# Patient Record
Sex: Female | Born: 1955 | Race: White | Hispanic: No | Marital: Married | Smoking: Never smoker
Health system: Southern US, Community
[De-identification: ages and names within clinical notes are randomized; demographics above are authoritative.]

## PROBLEM LIST (undated history)

## (undated) DIAGNOSIS — I4891 Unspecified atrial fibrillation: Secondary | ICD-10-CM

## (undated) DIAGNOSIS — F32A Depression, unspecified: Secondary | ICD-10-CM

## (undated) DIAGNOSIS — M1990 Unspecified osteoarthritis, unspecified site: Secondary | ICD-10-CM

## (undated) DIAGNOSIS — R002 Palpitations: Secondary | ICD-10-CM

## (undated) DIAGNOSIS — G4730 Sleep apnea, unspecified: Secondary | ICD-10-CM

## (undated) DIAGNOSIS — K219 Gastro-esophageal reflux disease without esophagitis: Secondary | ICD-10-CM

## (undated) HISTORY — PX: ENDOMETRIAL ABLATION: SHX621

## (undated) HISTORY — DX: Unspecified atrial fibrillation: I48.91

## (undated) HISTORY — PX: TUBAL LIGATION: SHX77

## (undated) HISTORY — PX: KNEE ARTHROSCOPY: SUR90

---

## 1997-08-05 ENCOUNTER — Other Ambulatory Visit: Admission: RE | Admit: 1997-08-05 | Discharge: 1997-08-05 | Payer: Self-pay | Admitting: Obstetrics and Gynecology

## 1998-07-17 ENCOUNTER — Ambulatory Visit (HOSPITAL_COMMUNITY): Admission: RE | Admit: 1998-07-17 | Discharge: 1998-07-17 | Payer: Self-pay | Admitting: Obstetrics and Gynecology

## 1998-08-13 ENCOUNTER — Other Ambulatory Visit: Admission: RE | Admit: 1998-08-13 | Discharge: 1998-08-13 | Payer: Self-pay | Admitting: Obstetrics and Gynecology

## 1998-12-02 ENCOUNTER — Encounter: Payer: Self-pay | Admitting: Family Medicine

## 1998-12-02 ENCOUNTER — Encounter: Admission: RE | Admit: 1998-12-02 | Discharge: 1998-12-02 | Payer: Self-pay | Admitting: Family Medicine

## 2000-08-30 ENCOUNTER — Encounter: Payer: Self-pay | Admitting: Obstetrics and Gynecology

## 2000-08-30 ENCOUNTER — Encounter: Admission: RE | Admit: 2000-08-30 | Discharge: 2000-08-30 | Payer: Self-pay | Admitting: Obstetrics and Gynecology

## 2000-11-01 ENCOUNTER — Other Ambulatory Visit: Admission: RE | Admit: 2000-11-01 | Discharge: 2000-11-01 | Payer: Self-pay | Admitting: Obstetrics and Gynecology

## 2002-08-22 ENCOUNTER — Encounter: Payer: Self-pay | Admitting: Obstetrics and Gynecology

## 2002-08-22 ENCOUNTER — Encounter: Admission: RE | Admit: 2002-08-22 | Discharge: 2002-08-22 | Payer: Self-pay | Admitting: Obstetrics and Gynecology

## 2003-09-11 ENCOUNTER — Other Ambulatory Visit: Admission: RE | Admit: 2003-09-11 | Discharge: 2003-09-11 | Payer: Self-pay | Admitting: Obstetrics and Gynecology

## 2003-09-24 ENCOUNTER — Encounter: Admission: RE | Admit: 2003-09-24 | Discharge: 2003-09-24 | Payer: Self-pay | Admitting: Obstetrics and Gynecology

## 2005-09-29 ENCOUNTER — Encounter: Admission: RE | Admit: 2005-09-29 | Discharge: 2005-09-29 | Payer: Self-pay | Admitting: Obstetrics and Gynecology

## 2005-09-29 IMAGING — MG MM SCREEN MAMMOGRAM BILATERAL
5 series · 5 of 5 positions shown · non-contrast
Comparison: none

DG SCREEN MAMMOGRAM BILATERAL
Bilateral CC and MLO view(s) were taken.
Prior study comparison: [DATE], bilateral screening mammogram.

SCREENING MAMMOGRAM:
The breast tissue is heterogeneously dense.   A possible mass is noted in the left breast.  Spot 
compression views and possibly sonography are recommended for further evaluation.  There are 
calcifications in the left breast.  Characterization with magnification views is recommended.  The 
right breast is unremarkable.

[R CC]
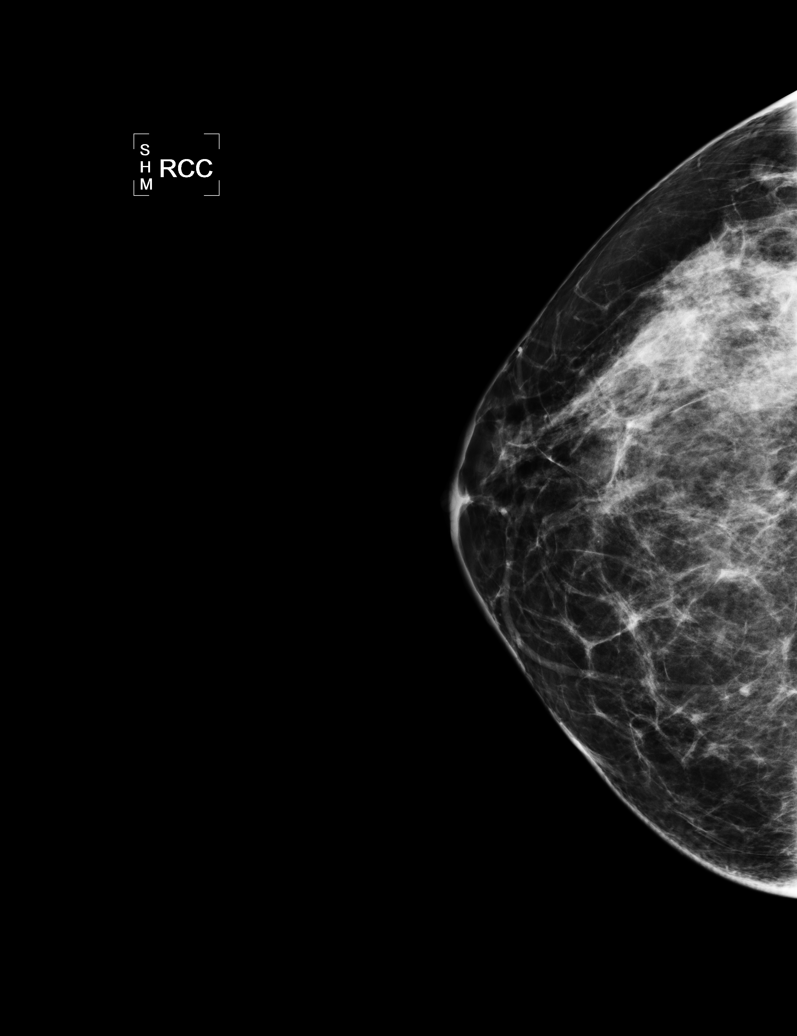

[L CC]
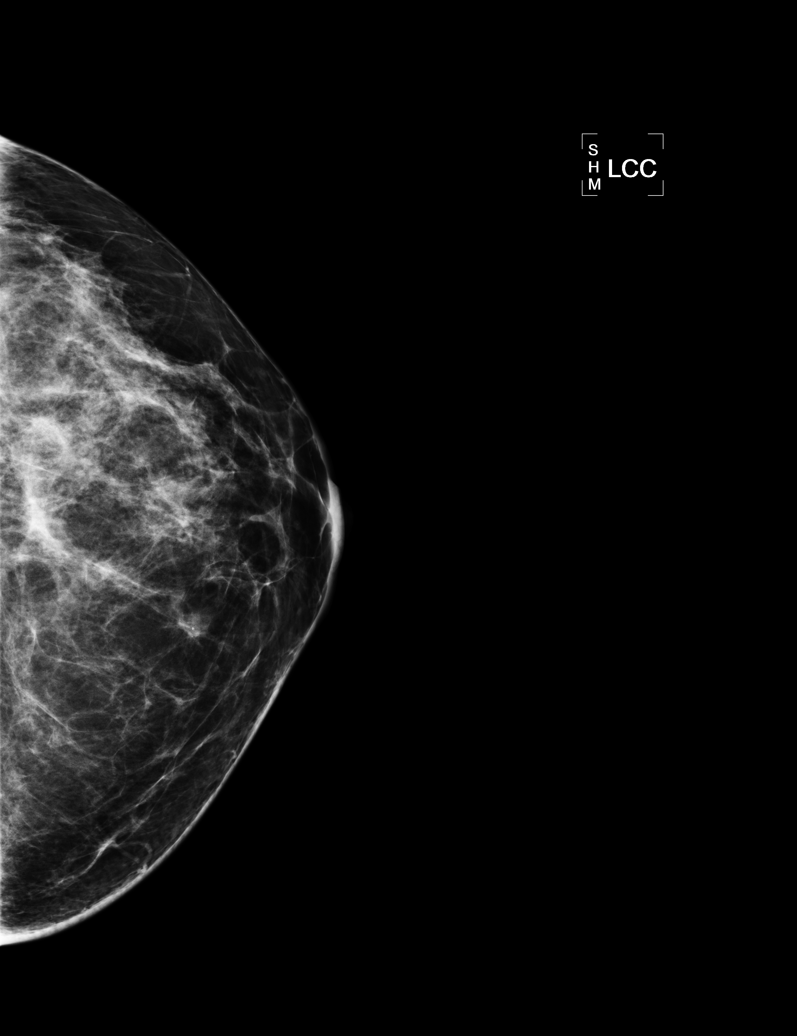

[L MLO]
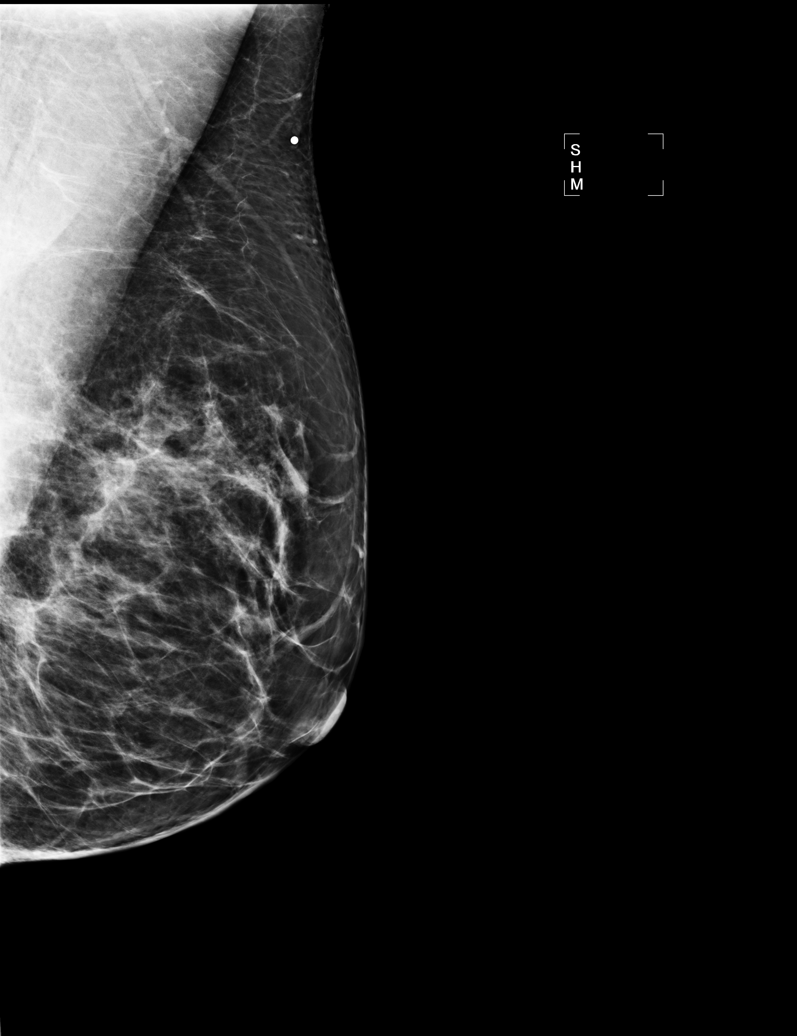

[R MLO]
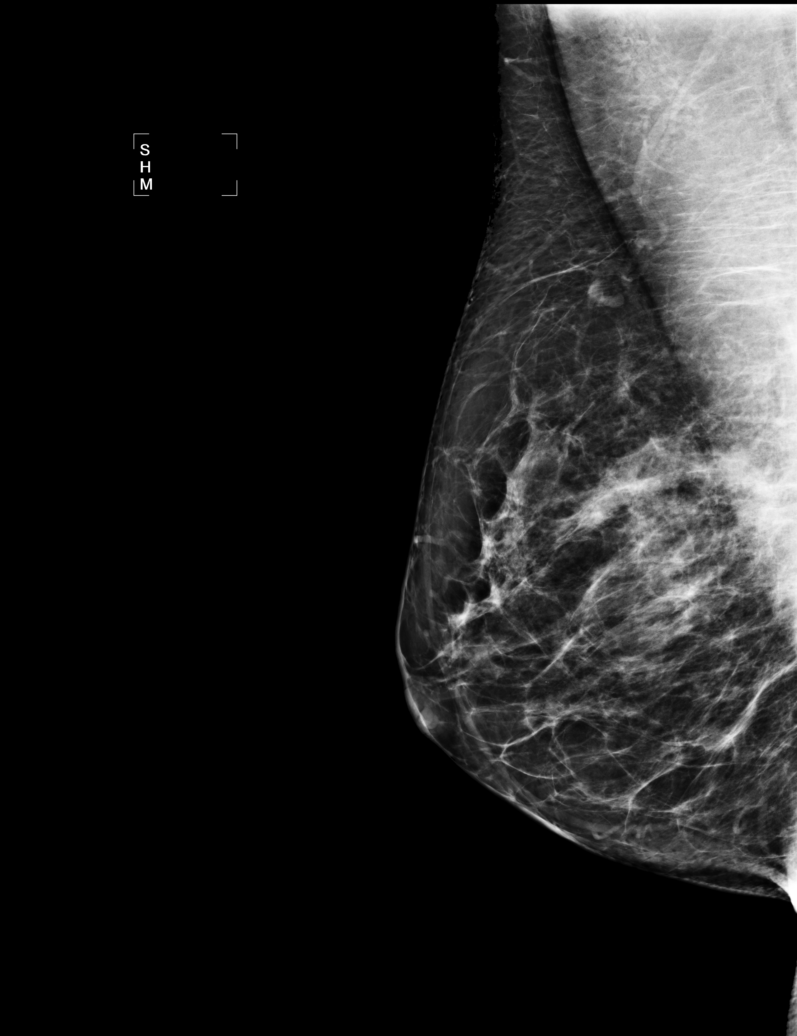

[R XCCL]
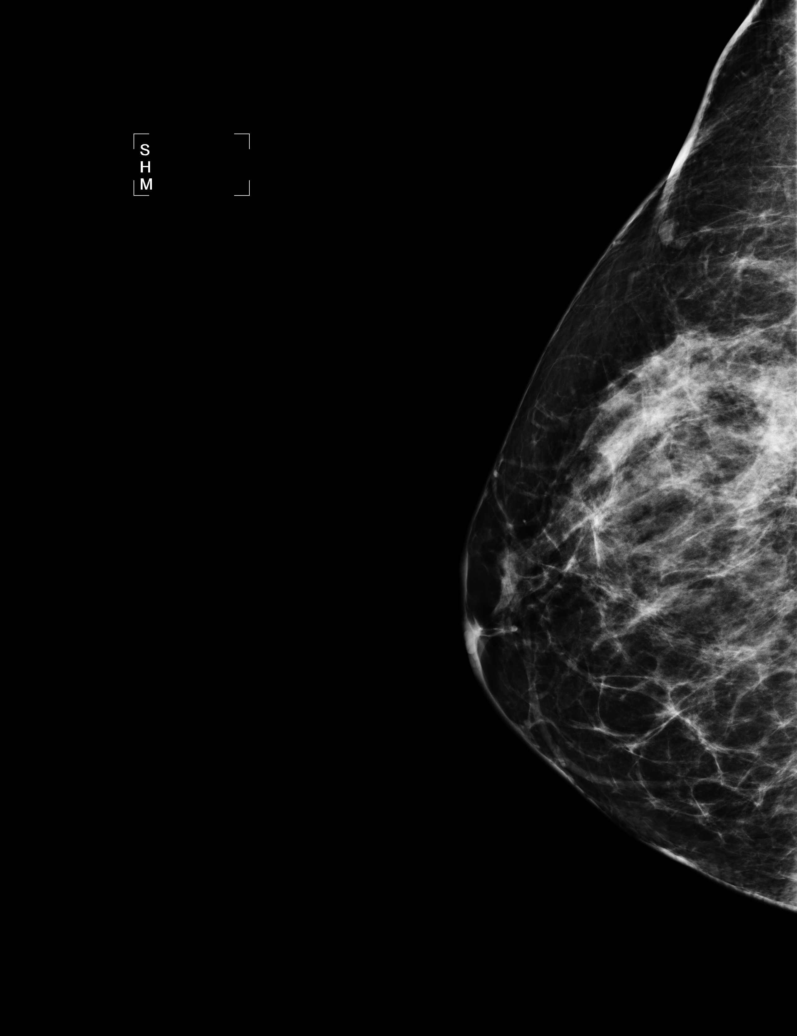

[5 of 5 positions shown; findings below may reference images not displayed]

IMPRESSION: Possible mass/calcifications, left breast.  Additional evaluation is indicated.  The patient will 
be contacted for additional studies and a supplemental report will follow.

ASSESSMENT: Need additional imaging evaluation and/or prior mammograms for comparison - BI-RADS 0 -
Left

Further imaging of the left breast.
ANALYZED BY COMPUTER AIDED DETECTION. , THIS PROCEDURE WAS A DIGITAL MAMMOGRAM.

## 2005-10-13 ENCOUNTER — Encounter: Admission: RE | Admit: 2005-10-13 | Discharge: 2005-10-13 | Payer: Self-pay | Admitting: Obstetrics and Gynecology

## 2005-10-13 IMAGING — MG MM DIAGNOSTIC LTD LEFT
2 series · 2 of 2 positions shown · non-contrast
Comparison: none

[REDACTED] LEFT
CC and MLO view(s) were taken of the left breast.

DIGITAL LIMITED LEFT DIAGNOSTIC MAMMOGRAM:
CLINICAL DATA: The patient returns for evaluation of calcifications in the left breast noted on 
recent screening study of [DATE].

[L CC]
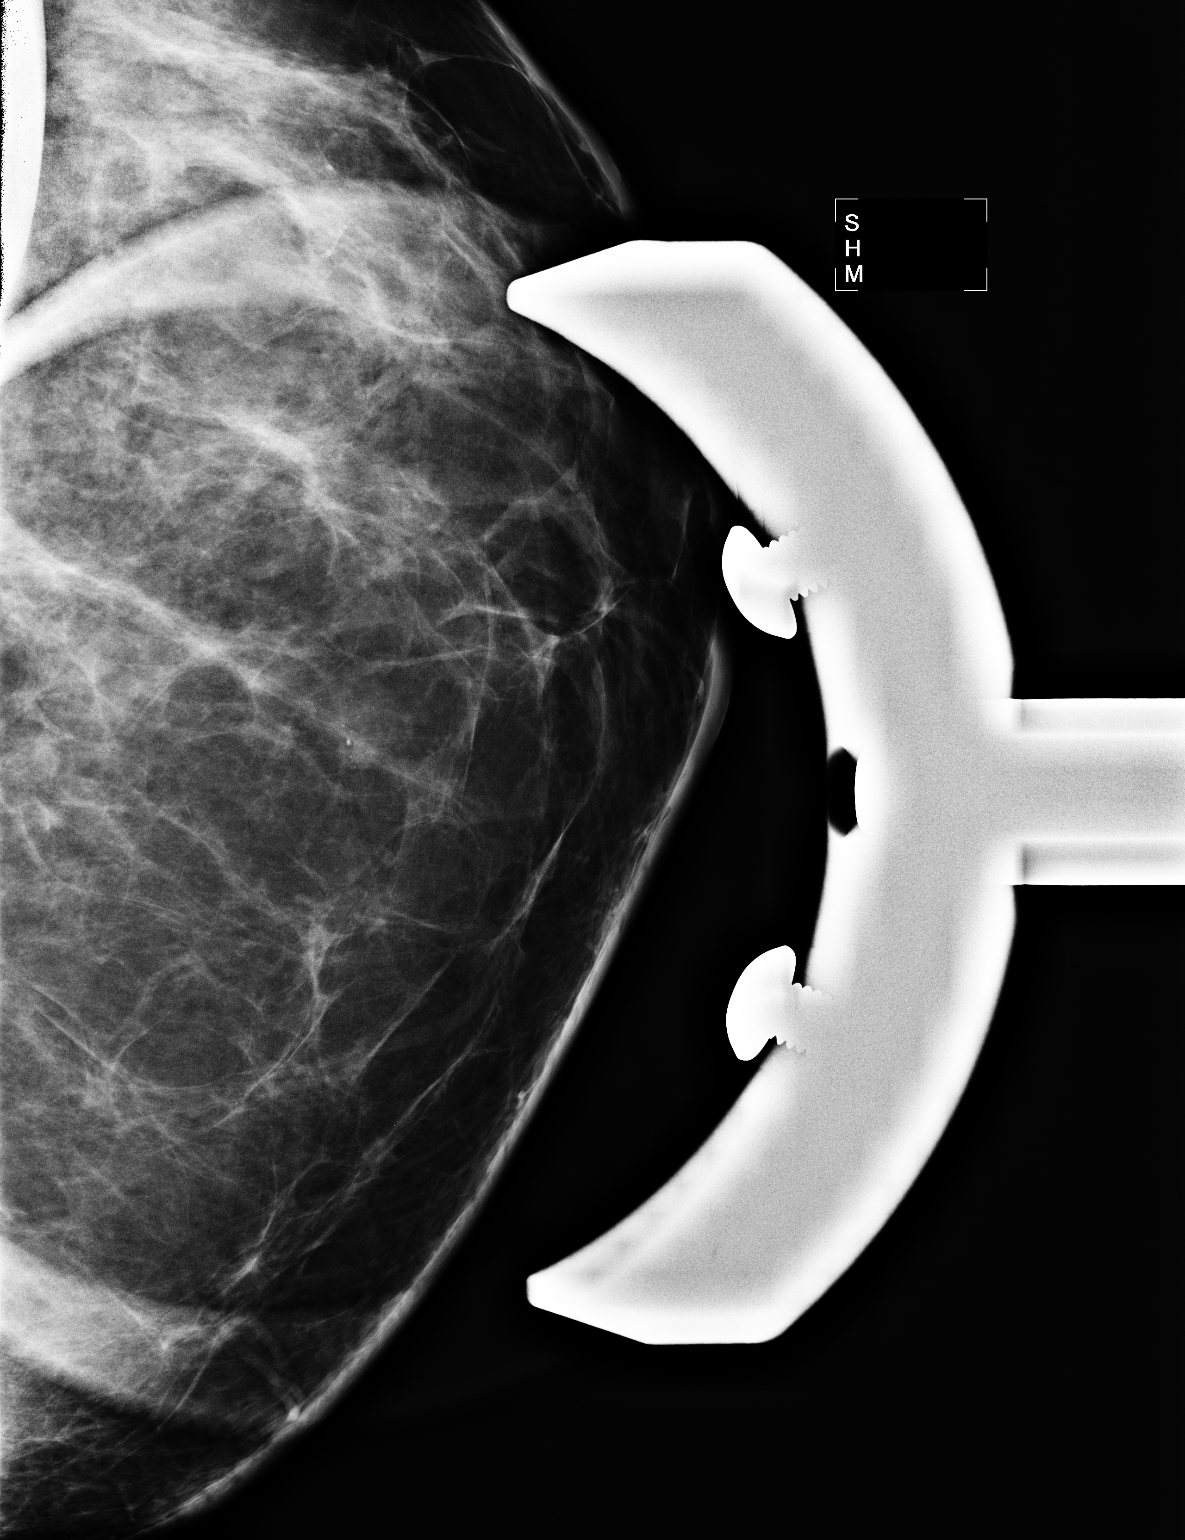

[L ML]
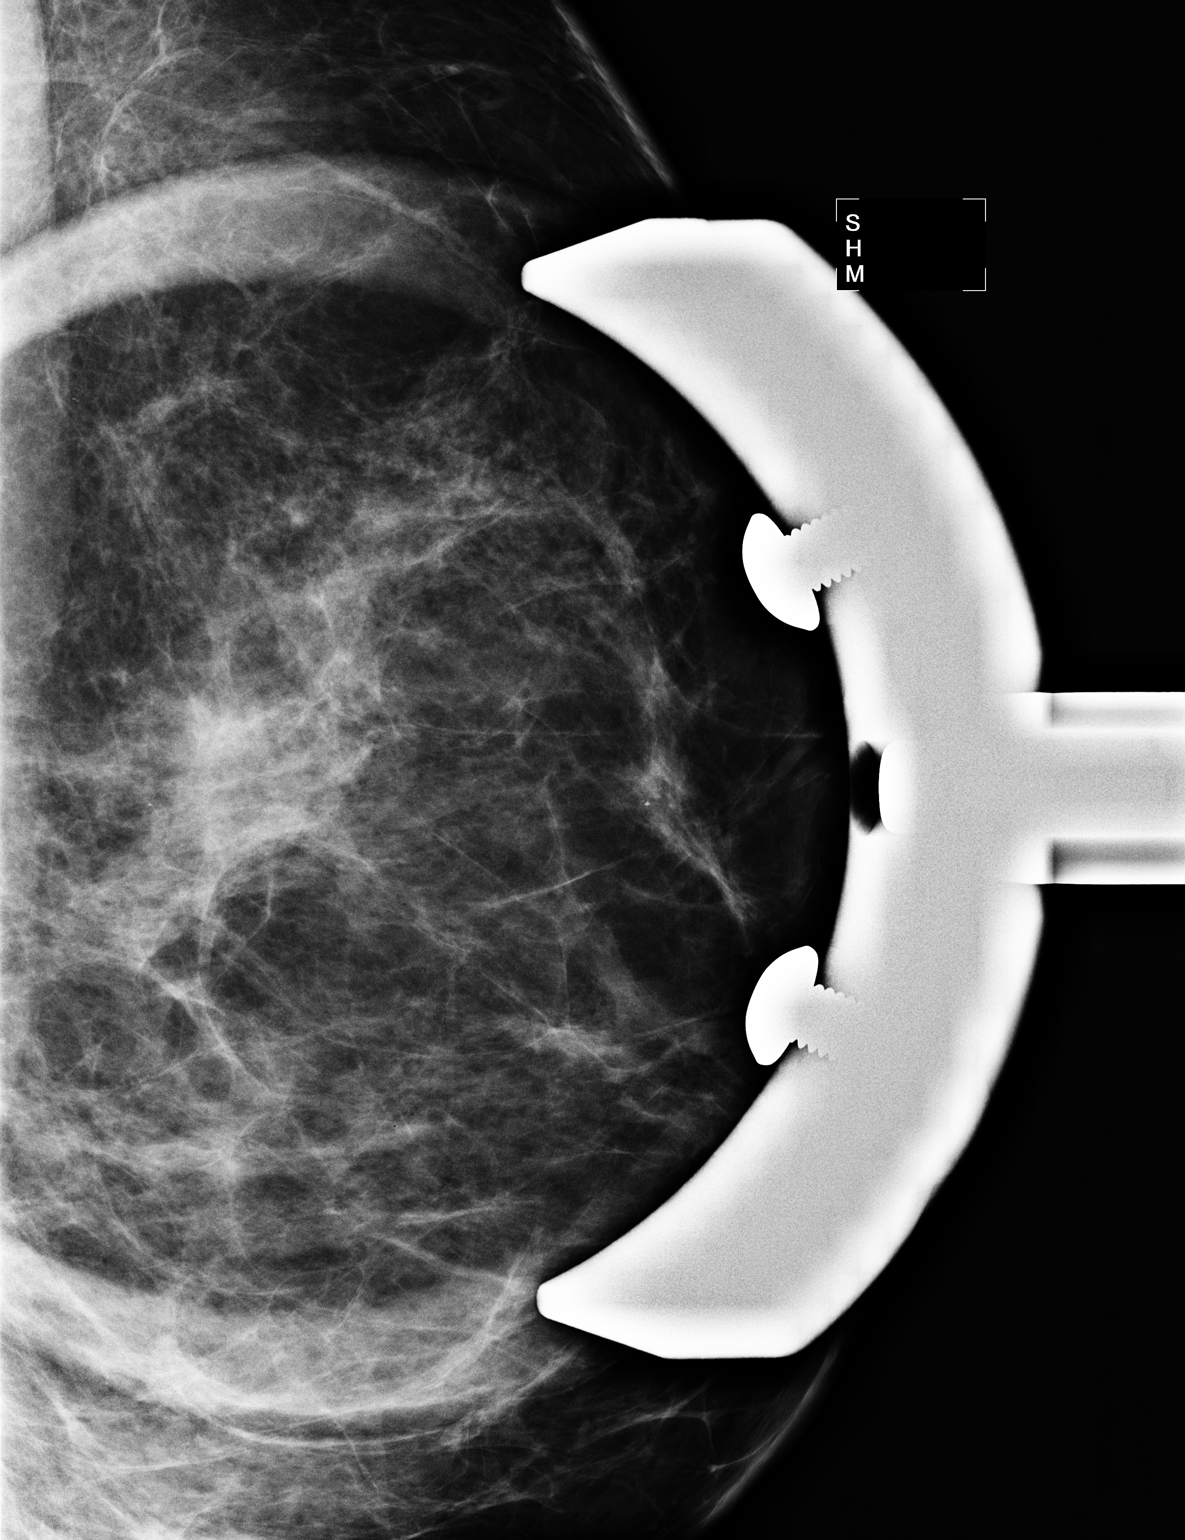

[2 of 2 positions shown; findings below may reference images not displayed]

Magnification views in the CC and ML projection demonstrate smudgy calcifications in the CC 
projection that appear to form QUIRIJN shapes on the ML projection.  This is likely benign milk of 
calcium.  In reviewing prior studies of [DATE] and [DATE], there is some motion on the left images 
but it is likely that these calcifications were present in [KP] but better seen currently due to 
the digital technique.  Six-month follow-up mammography would be suggested.
IMPRESSION: Probably benign calcification in the left breast likely better seen currently due to the digital 
technique.  Suggest six-month follow-up mammography.

ASSESSMENT: Probably benign - BI-RADS 3

Diagnostic mammogram of the left breast in 6 months.
, THIS PROCEDURE WAS A DIGITAL MAMMOGRAM.

## 2006-03-04 ENCOUNTER — Ambulatory Visit (HOSPITAL_COMMUNITY): Admission: RE | Admit: 2006-03-04 | Discharge: 2006-03-04 | Payer: Self-pay | Admitting: Obstetrics and Gynecology

## 2006-03-04 ENCOUNTER — Encounter (INDEPENDENT_AMBULATORY_CARE_PROVIDER_SITE_OTHER): Payer: Self-pay | Admitting: Specialist

## 2006-03-18 ENCOUNTER — Encounter: Admission: RE | Admit: 2006-03-18 | Discharge: 2006-03-18 | Payer: Self-pay | Admitting: Obstetrics and Gynecology

## 2006-03-18 IMAGING — MG MM DIAGNOSTIC UNILATERAL L
4 series · 4 of 4 positions shown · non-contrast
Comparison: none

DG DIAGNOSTIC UNILATERAL L
CC and MLO view(s) were taken of the left breast.

DIGITAL UNILATERAL LEFT DIAGNOSTIC MAMMOGRAM:
CLINICAL DATA: The fibroglandular parenchymal pattern throughout the left breast is stable.  Benign-appearing 
calcifications in the inner left breast are stable, as well. No dominant masses, suspicious 
calcifications, or areas of architectural distortion are seen on the left.

[L CC (1 of 2)]
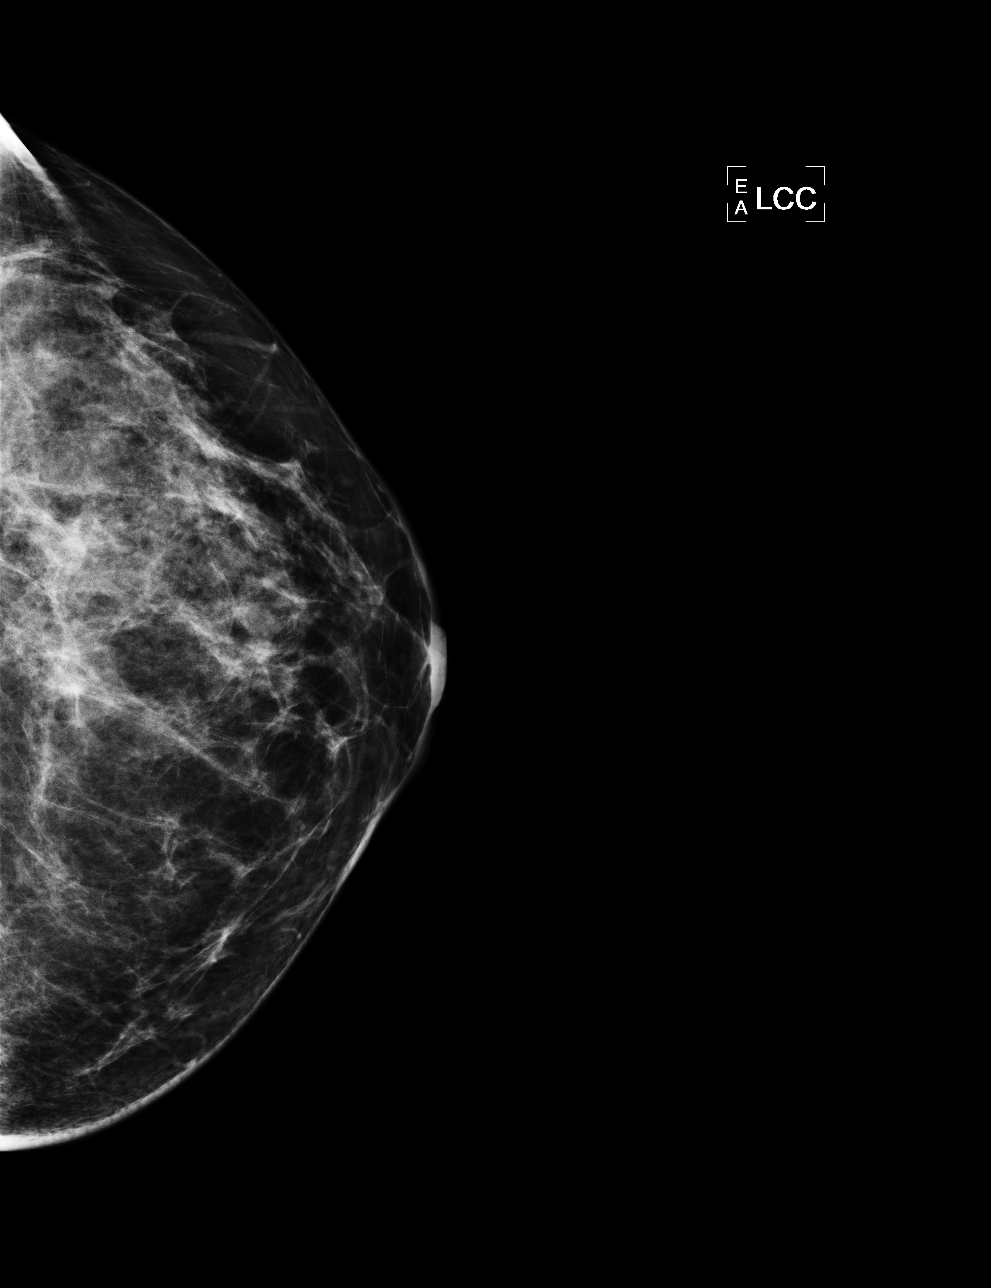

[L MLO]
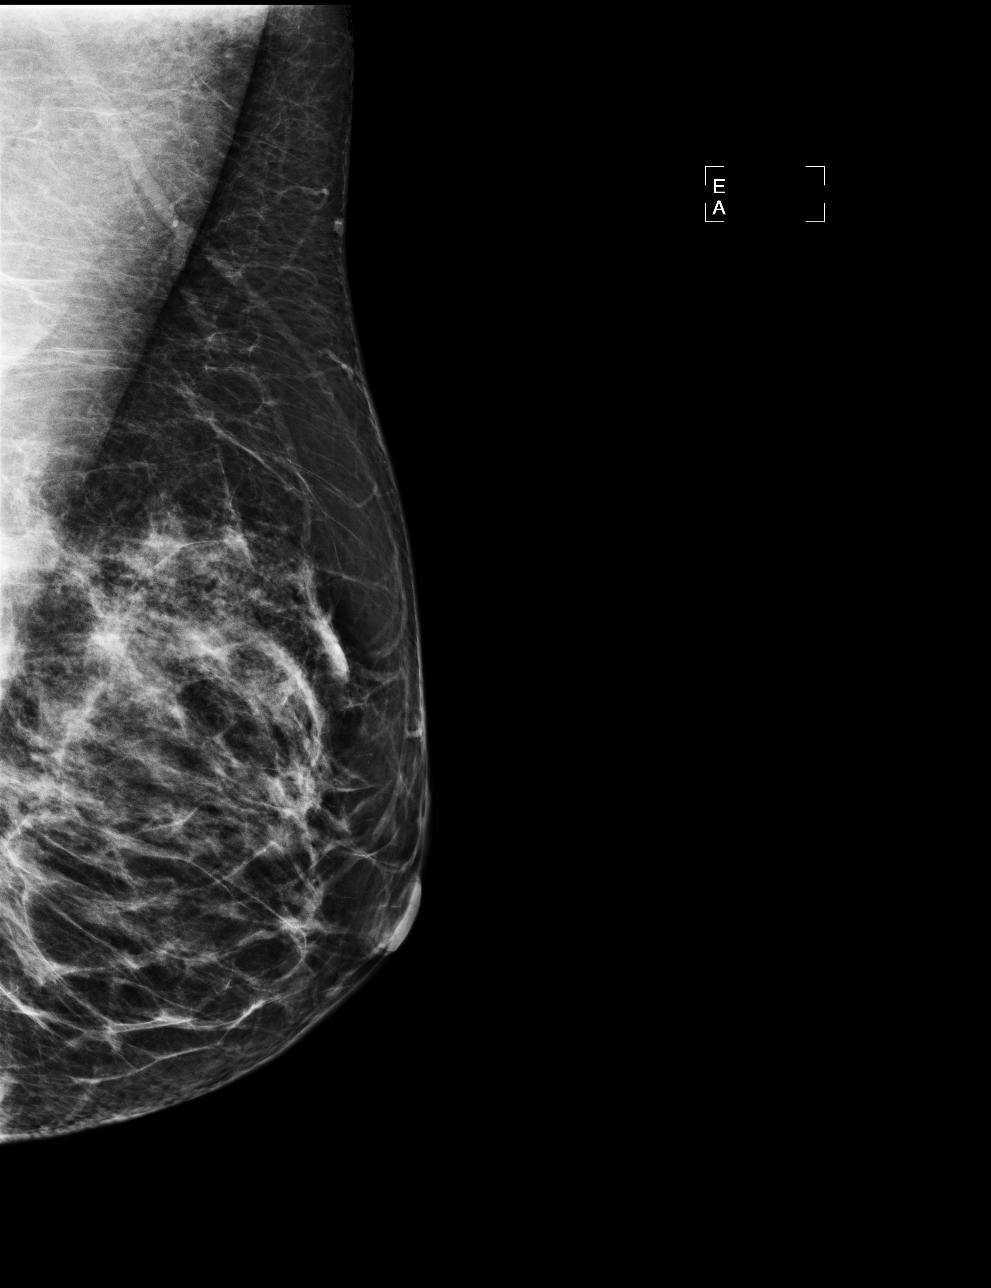

[L CC (2 of 2)]
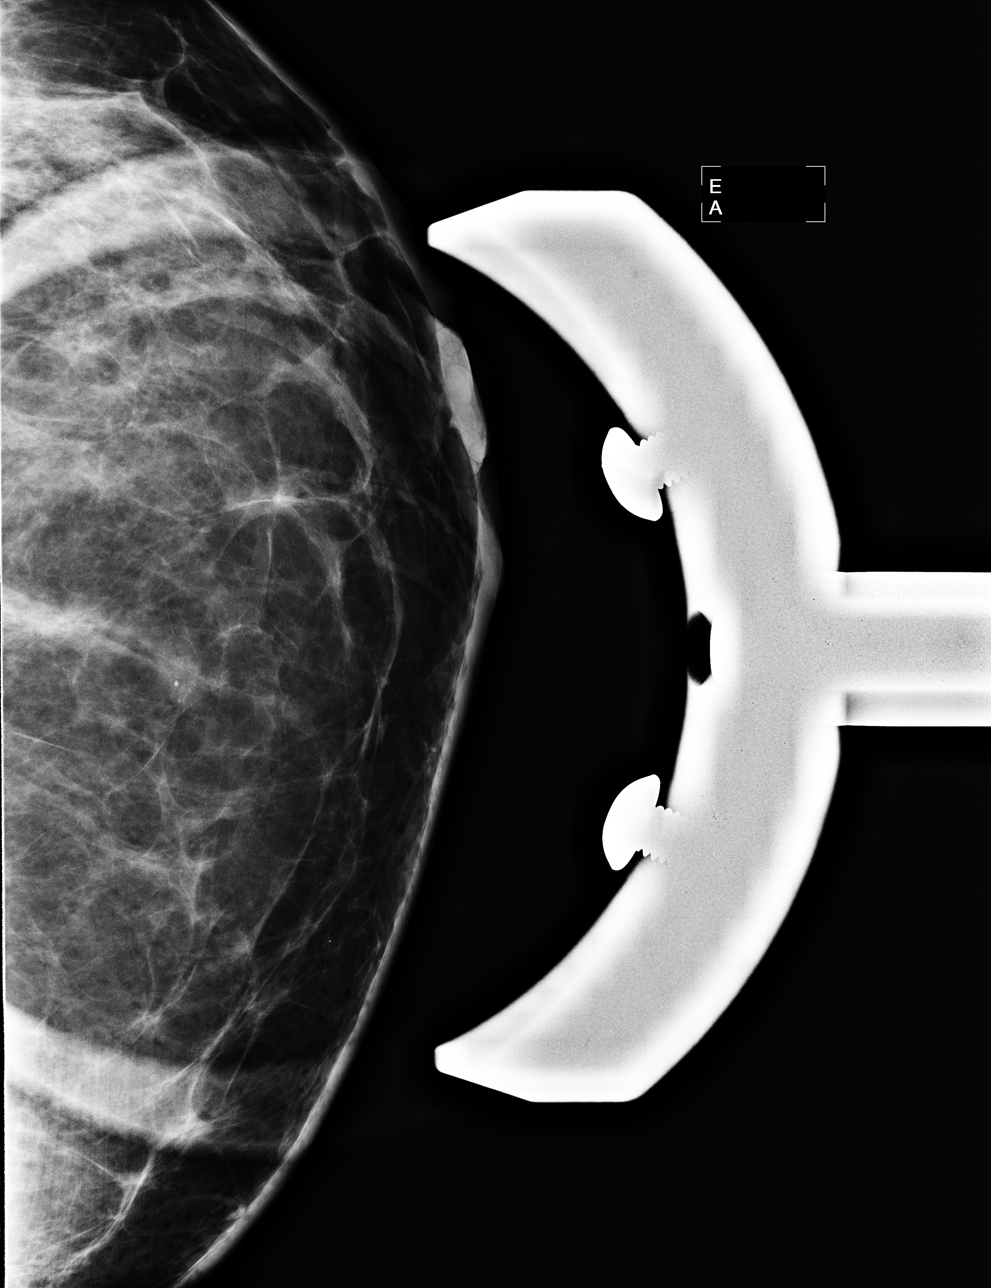

[L ML]
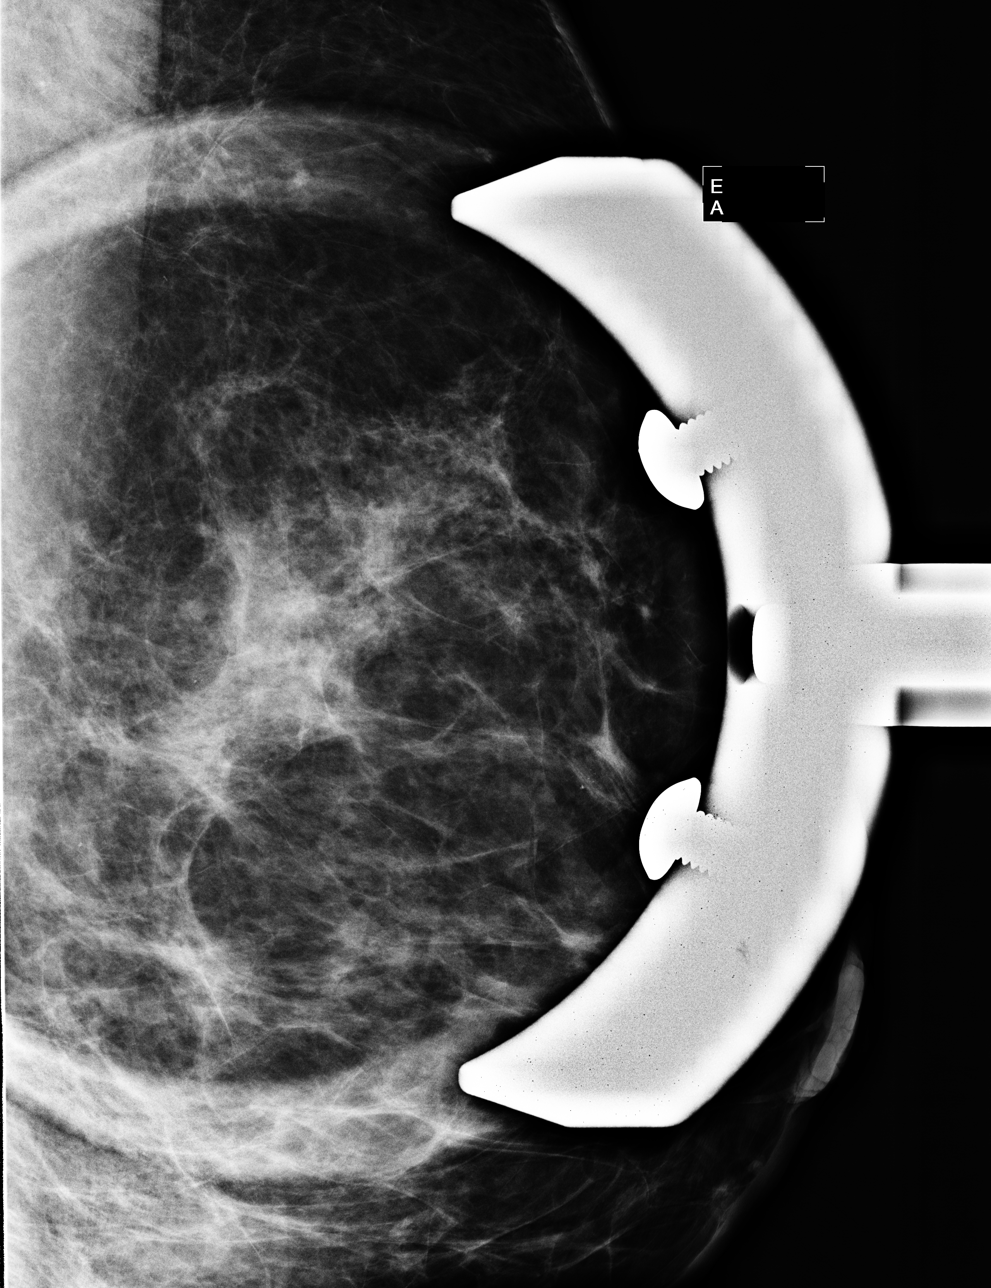

[4 of 4 positions shown; findings below may reference images not displayed]

IMPRESSION: Stable benign-appearing calcifications in the inner left breast. Screening mammogram in one year is
recommended.

ASSESSMENT: Benign - BI-RADS 2

Screening mammogram of both breasts in 1 year.
,

## 2007-03-07 ENCOUNTER — Encounter: Admission: RE | Admit: 2007-03-07 | Discharge: 2007-03-07 | Payer: Self-pay | Admitting: Obstetrics and Gynecology

## 2008-05-15 ENCOUNTER — Encounter: Admission: RE | Admit: 2008-05-15 | Discharge: 2008-05-15 | Payer: Self-pay | Admitting: Obstetrics and Gynecology

## 2008-05-15 IMAGING — MG MM SCREEN MAMMOGRAM BILATERAL
4 series · 4 of 4 positions shown · non-contrast
Comparison: none

DG SCREEN MAMMOGRAM BILATERAL
Bilateral CC and MLO view(s) were taken.

DIGITAL SCREENING MAMMOGRAM WITH CAD:
There are scattered fibroglandular densities.  No masses or malignant type calcifications are 
identified.  Compared with prior studies.

[R CC]
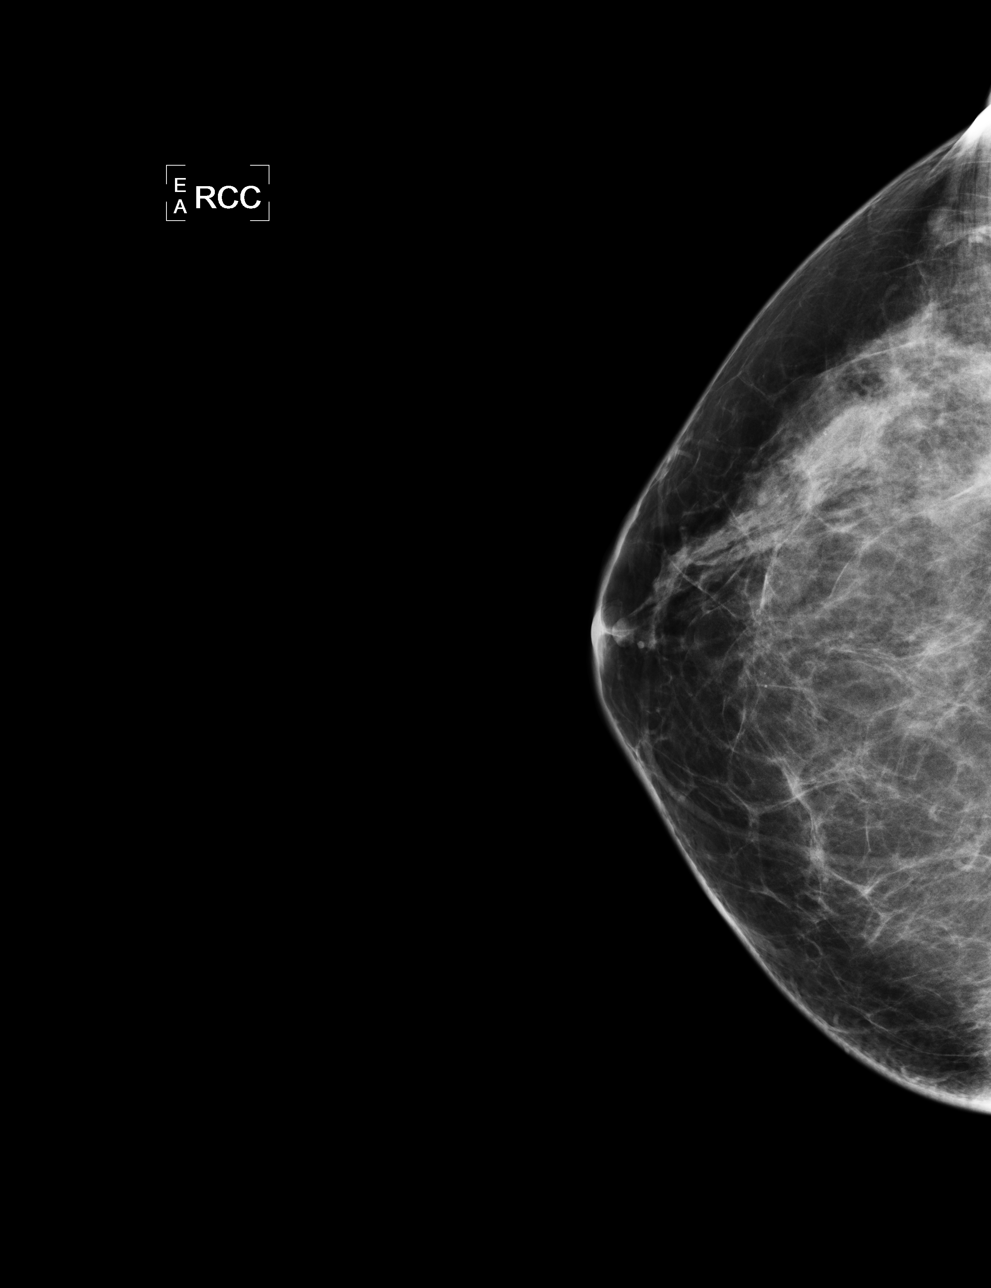

[L CC]
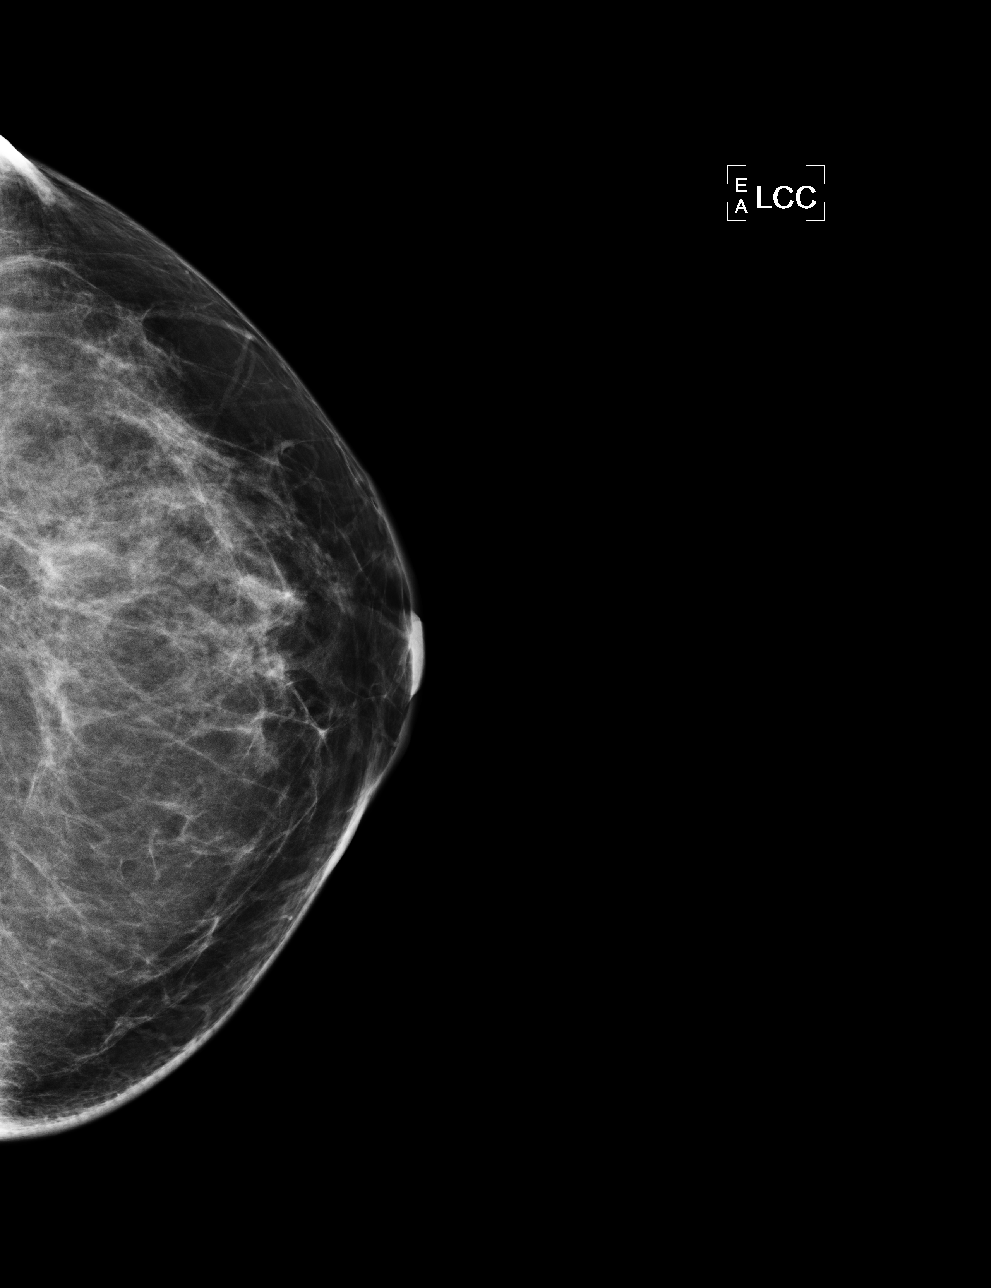

[L MLO]
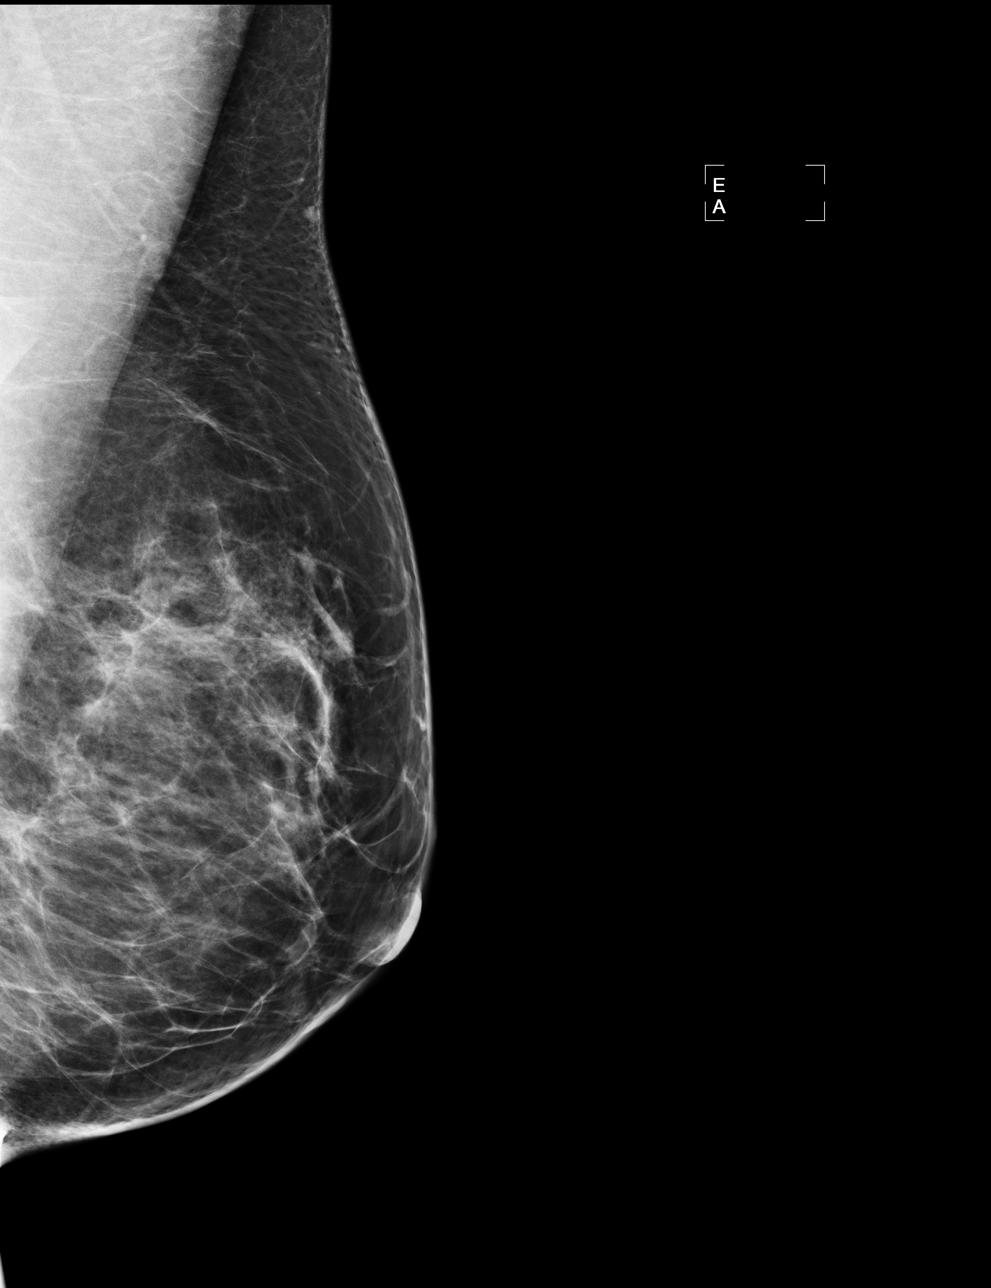

[R MLO]
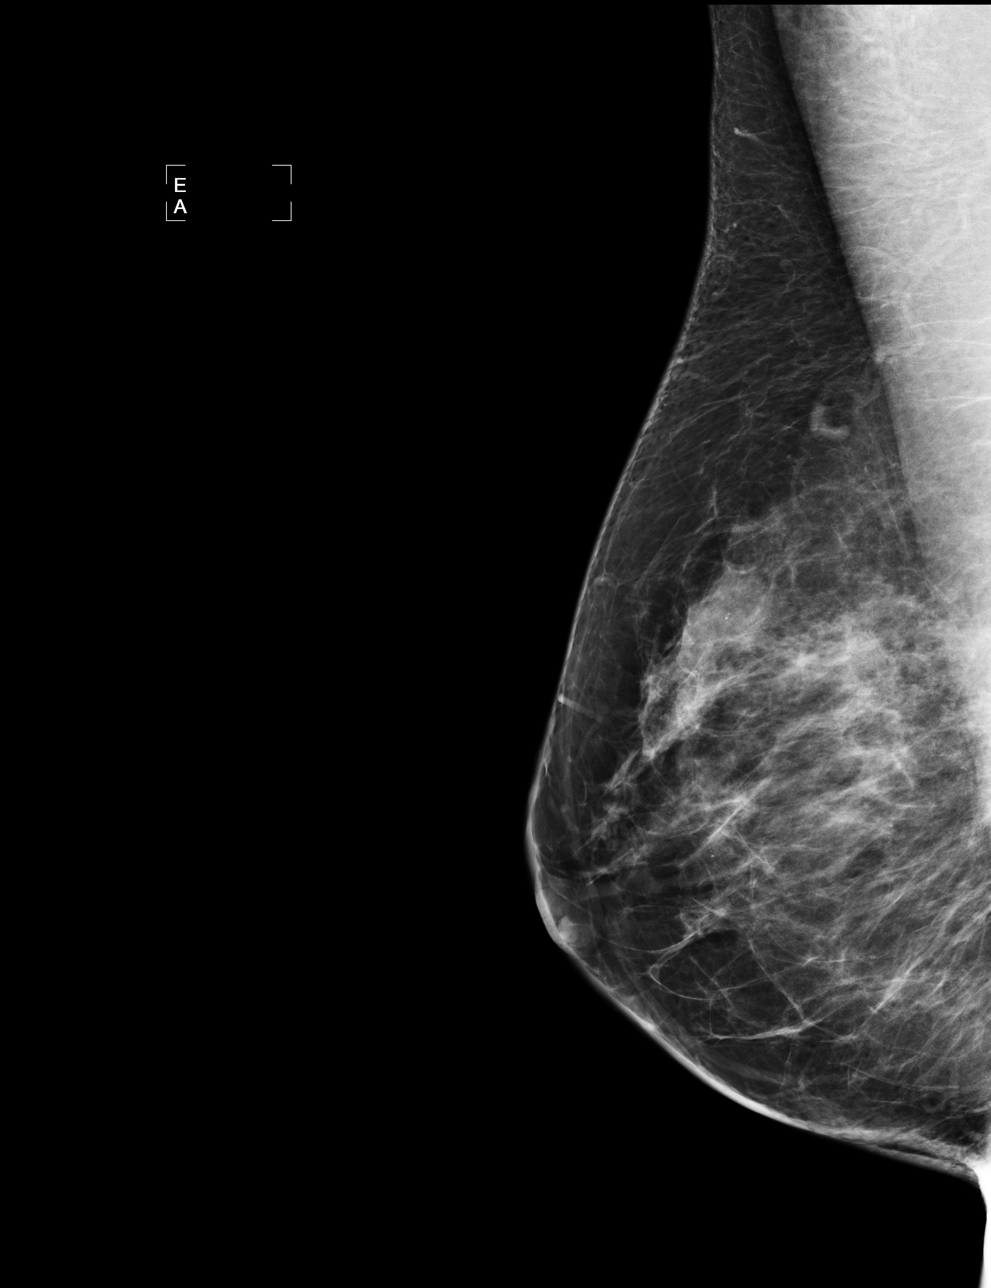

[4 of 4 positions shown; findings below may reference images not displayed]

IMPRESSION: No specific mammographic evidence of malignancy.  Next screening mammogram is recommended in one 
year.

A result letter of this screening mammogram will be mailed directly to the patient.

ASSESSMENT: Negative - BI-RADS 1

Screening mammogram in 1 year.
ANALYZED BY COMPUTER AIDED DETECTION. , THIS PROCEDURE WAS A DIGITAL MAMMOGRAM.

## 2009-09-10 ENCOUNTER — Encounter: Admission: RE | Admit: 2009-09-10 | Discharge: 2009-09-10 | Payer: Self-pay | Admitting: Obstetrics and Gynecology

## 2009-09-10 IMAGING — MG MM DIGITAL SCREENING
4 series · 4 of 4 positions shown · non-contrast
Comparison: none

DG SCREEN MAMMOGRAM BILATERAL
Bilateral CC and MLO view(s) were taken.

DIGITAL SCREENING MAMMOGRAM WITH CAD:
There are scattered fibroglandular densities.  Microcalcifications are present in the right breast.
Characterization with magnification views is recommended.   A possible mass is noted in the right
breast.  Spot compression views and possibly sonography are recommended for further evaluation.  
No mass or malignant type calcifications are identified in the left breast.  Compared with prior 
studies.
Images were processed with CAD.

[R CC]
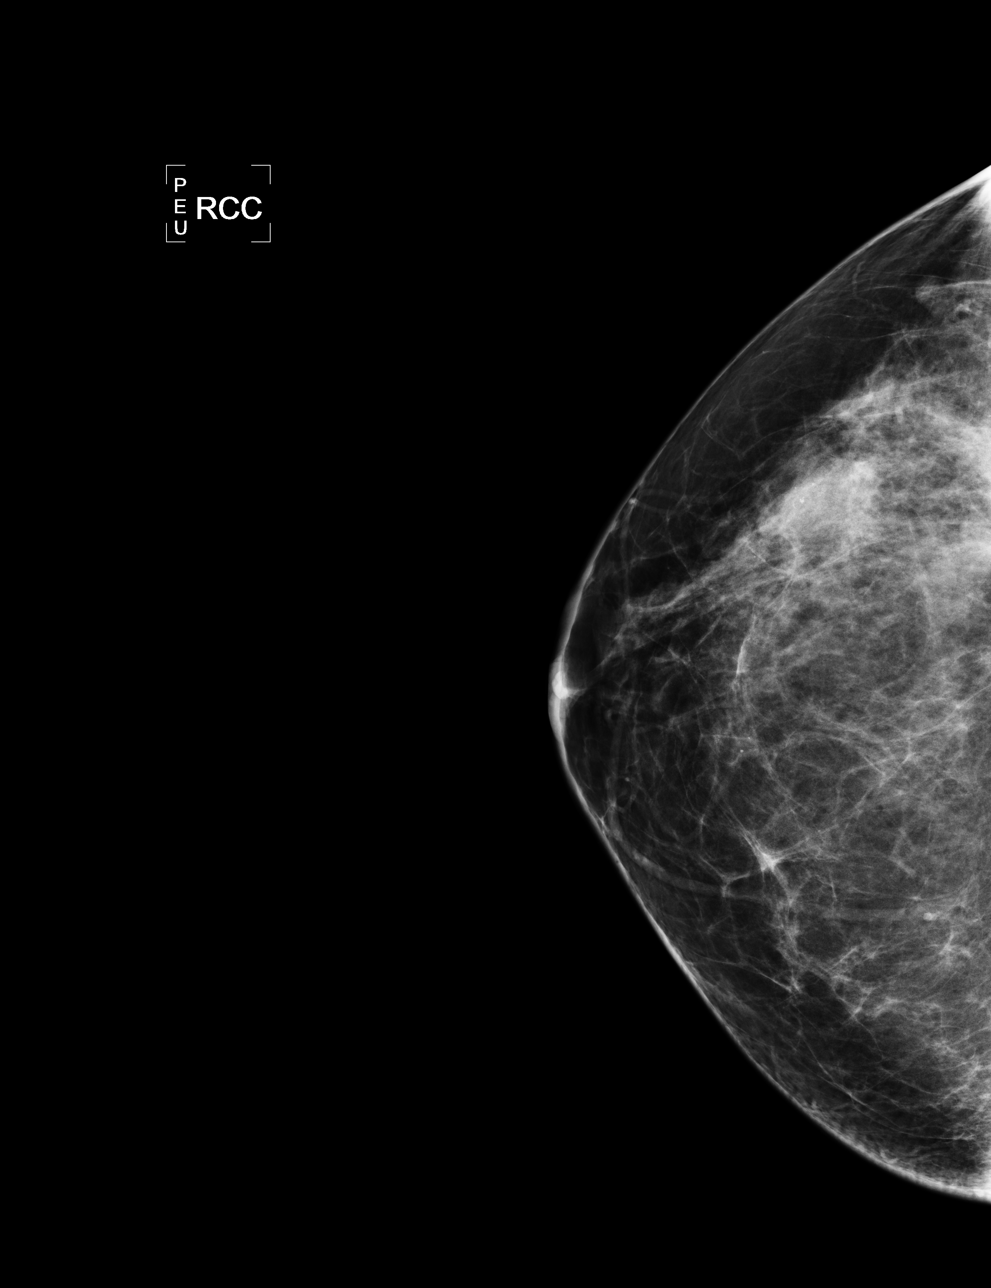

[L CC]
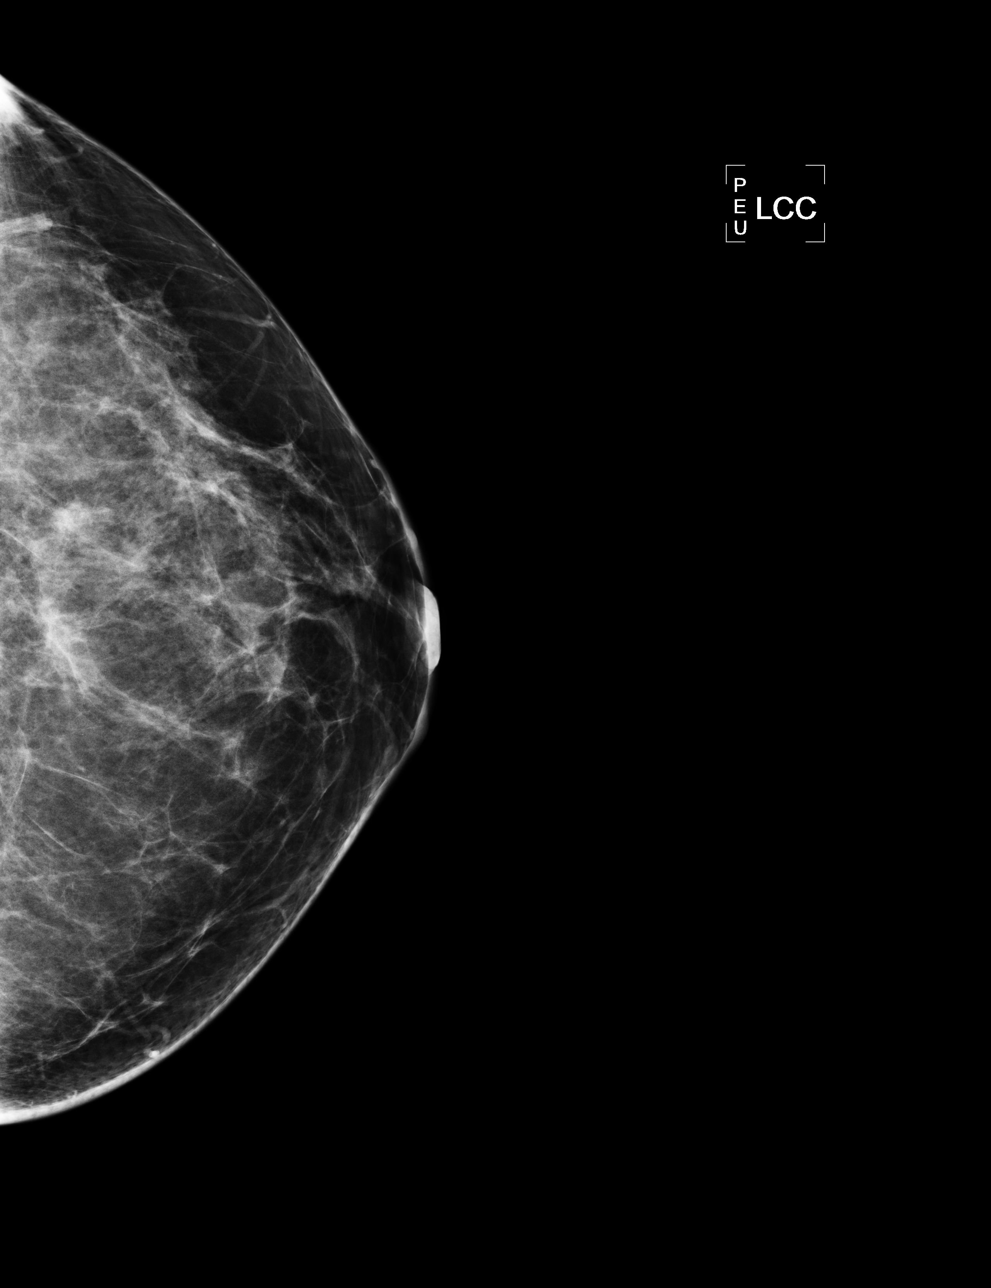

[L MLO]
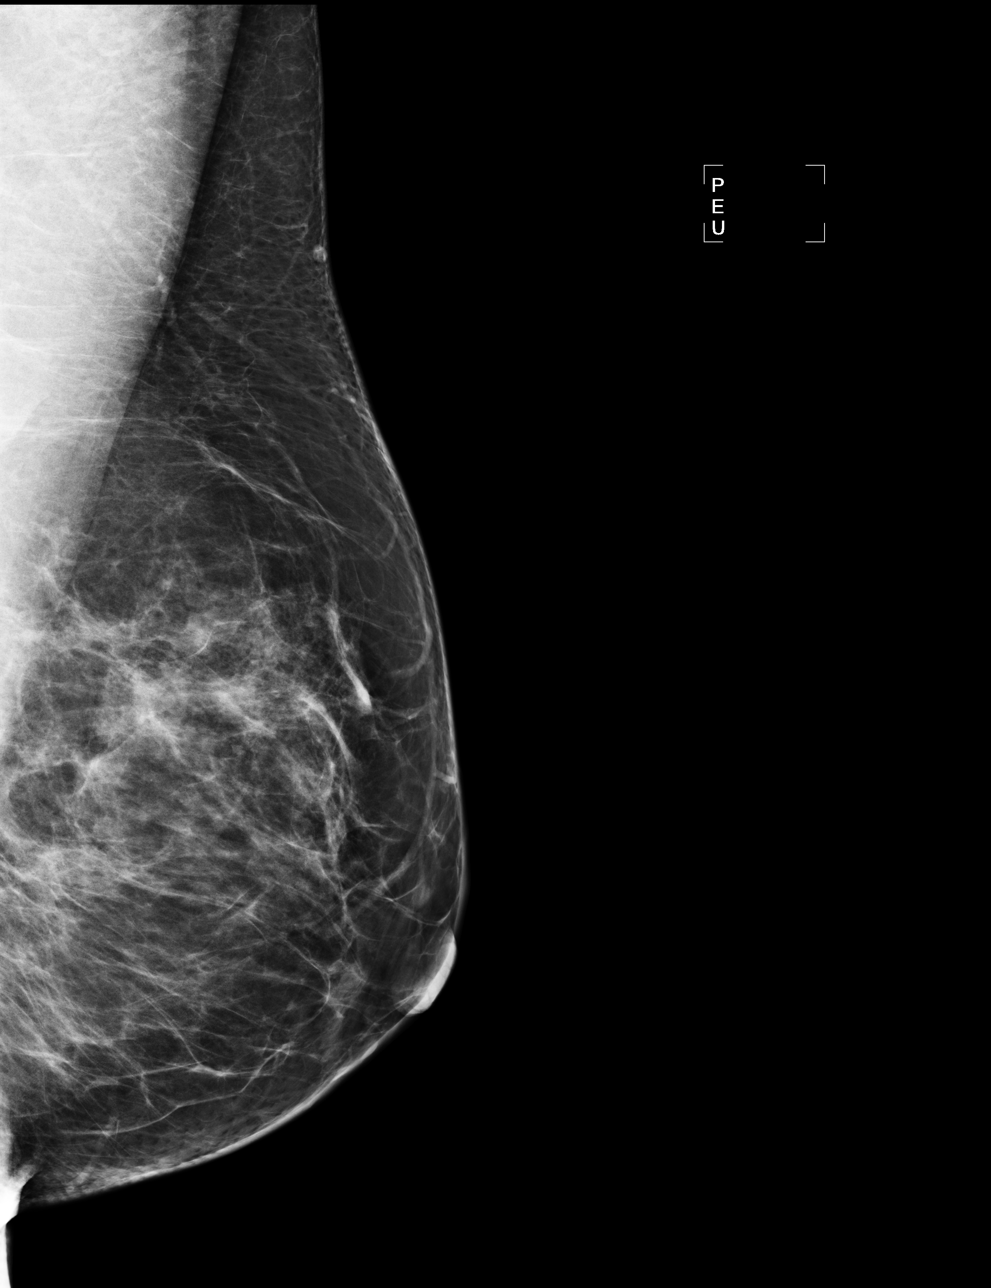

[R MLO]
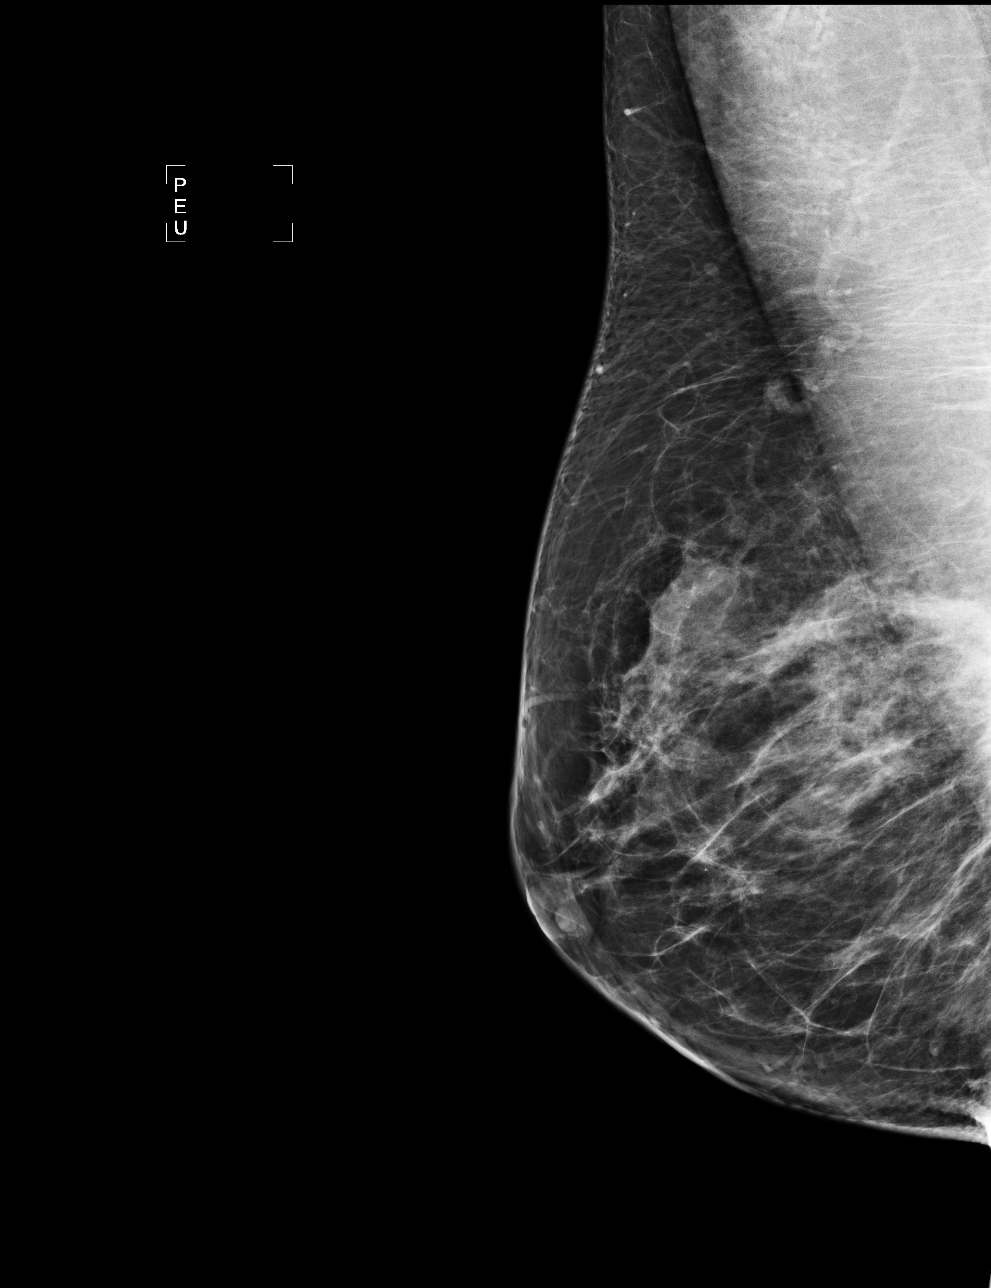

[4 of 4 positions shown; findings below may reference images not displayed]

IMPRESSION: Calcifications, right breast and possible mass, right breast.  Additional evaluation is indicated. 
The patient will be contacted for additional studies and a supplementary report will follow.  No 
specific mammographic evidence of malignancy, left breast.

ASSESSMENT: Need additional imaging evaluation and/or prior mammograms for comparison - BI-RADS 0

Further imaging of the right breast.
,

## 2009-09-16 ENCOUNTER — Encounter: Admission: RE | Admit: 2009-09-16 | Discharge: 2009-09-16 | Payer: Self-pay | Admitting: Obstetrics and Gynecology

## 2009-09-16 IMAGING — MG MM DIGITAL DIAG LTD R
2 series · 2 of 2 positions shown · non-contrast
Comparison: Multiple priors

CLINICAL DATA: Screening call back, right breast

DIGITAL DIAGNOSTIC RIGHT MAMMOGRAM WITHOUT CAD AND RIGHT BREAST
ULTRASOUND:

[R CC]
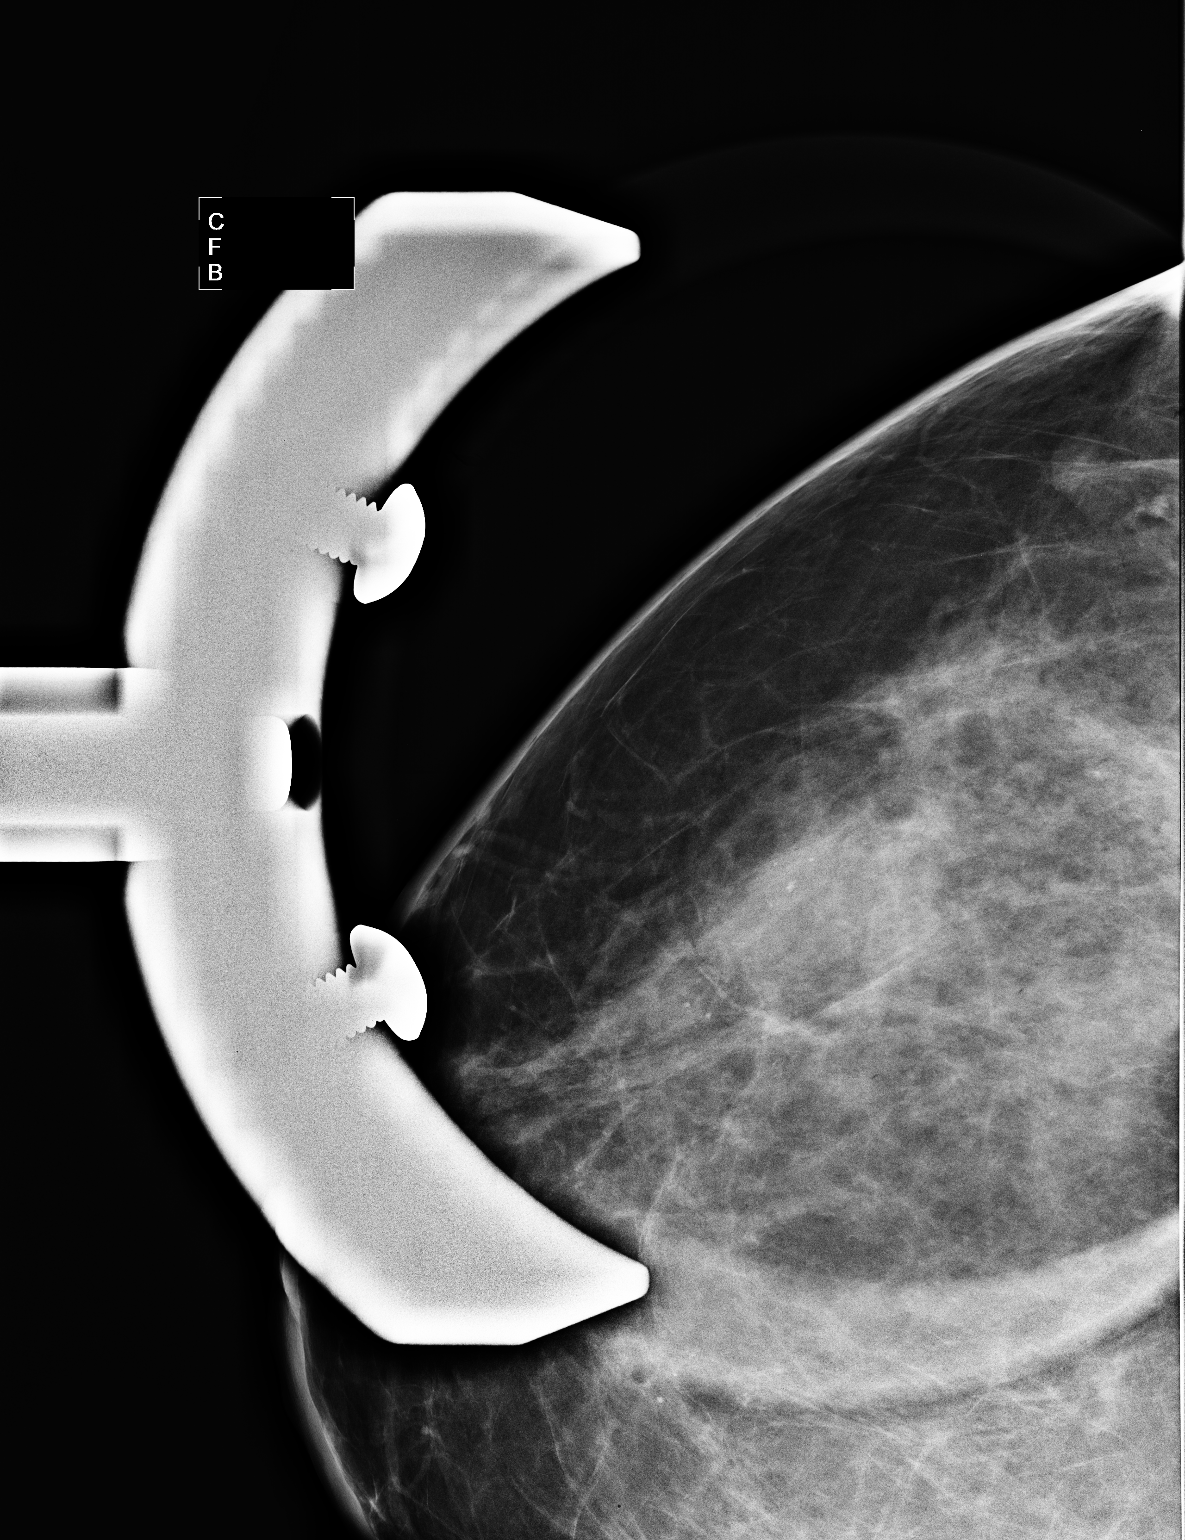

[R ML]
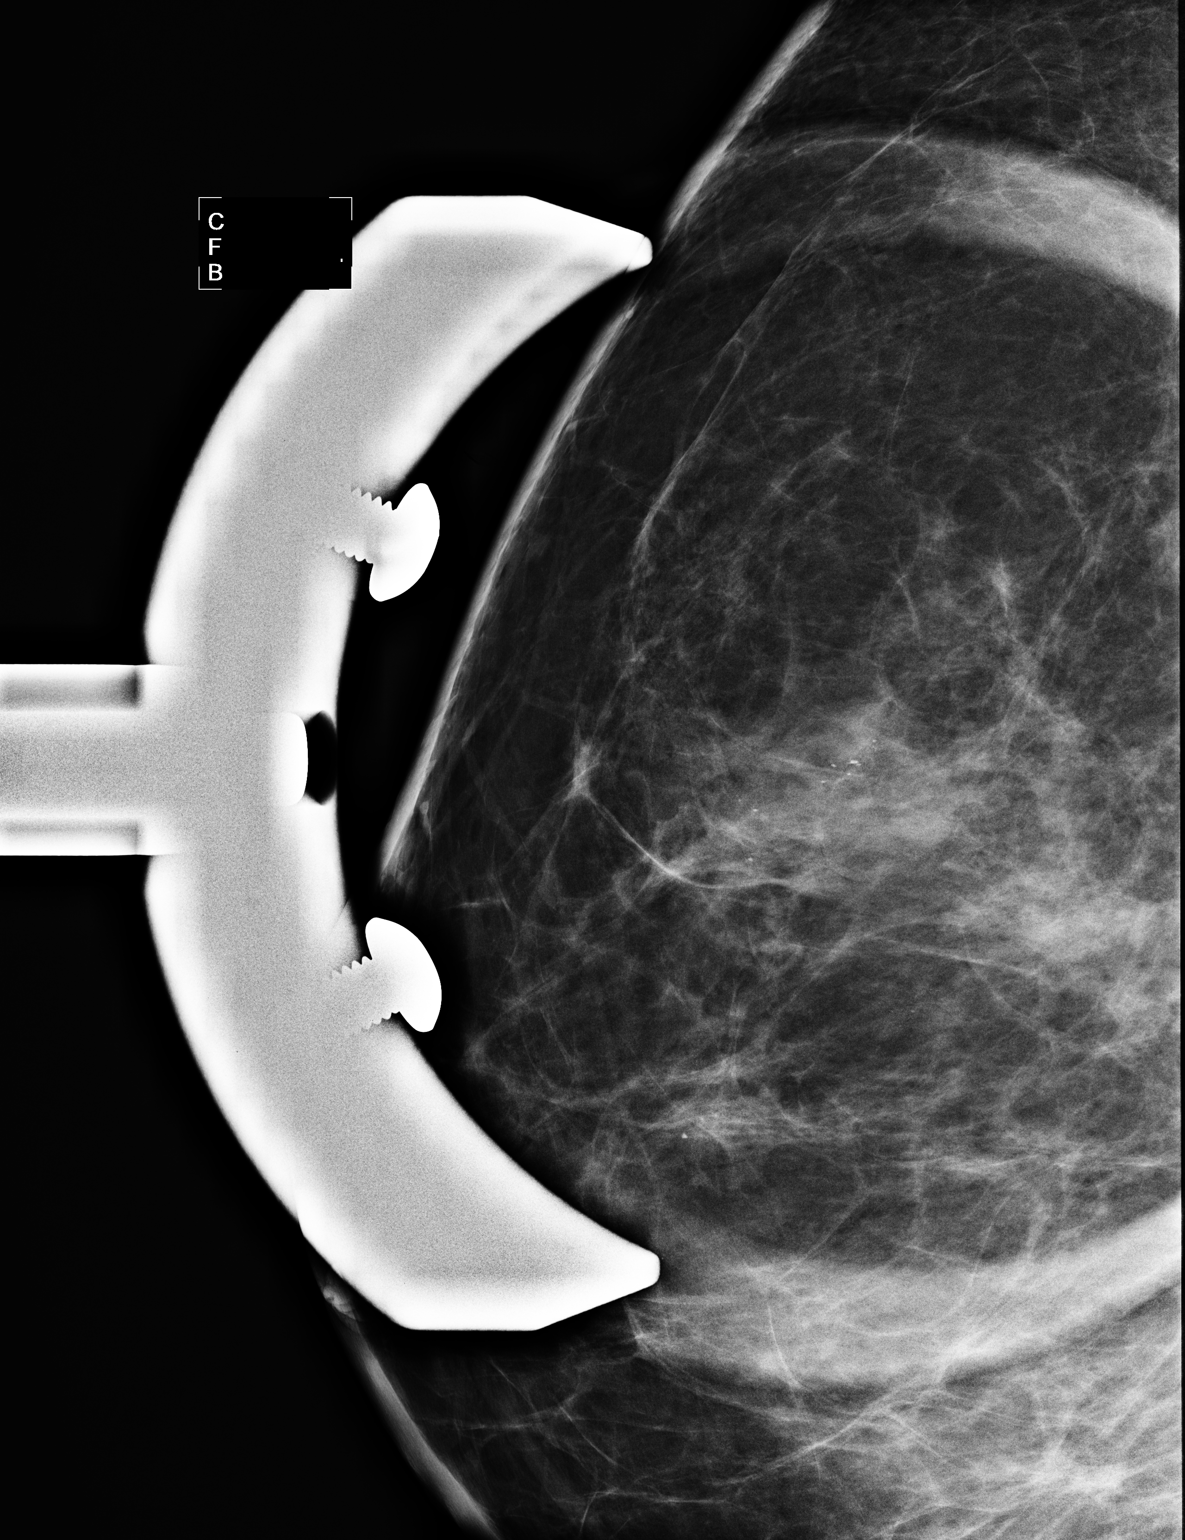

[2 of 2 positions shown; findings below may reference images not displayed]

FINDINGS: Spot compression views in the upper-outer aspect of the
right breast confirm the presence of fibroglandular tissue and
loosely grouped punctate and round calcifications.  Compared with
priors.

On physical exam, I palpate normal tissue in the upper-outer
quadrant of the right breast.

Ultrasound is performed, showing a cluster of cysts measuring a
maximum of 1.2 x 0.8 x 1.6 cm corresponding to the area of
mammographic concern.
IMPRESSION: Cysts, right breast.  No mammographic evidence of malignancy.
Recommend routine screening mammography in 1 year.

BI-RADS CATEGORY 2:  Benign finding(s).

## 2009-09-16 IMAGING — US US BREAST R
1 series · 5 of 5 positions shown · non-contrast
Comparison: Multiple priors

CLINICAL DATA: Screening call back, right breast

DIGITAL DIAGNOSTIC RIGHT MAMMOGRAM WITHOUT CAD AND RIGHT BREAST
ULTRASOUND:

[Series 1: us breast right · 5 of 5 slices shown]
[im 1/5]
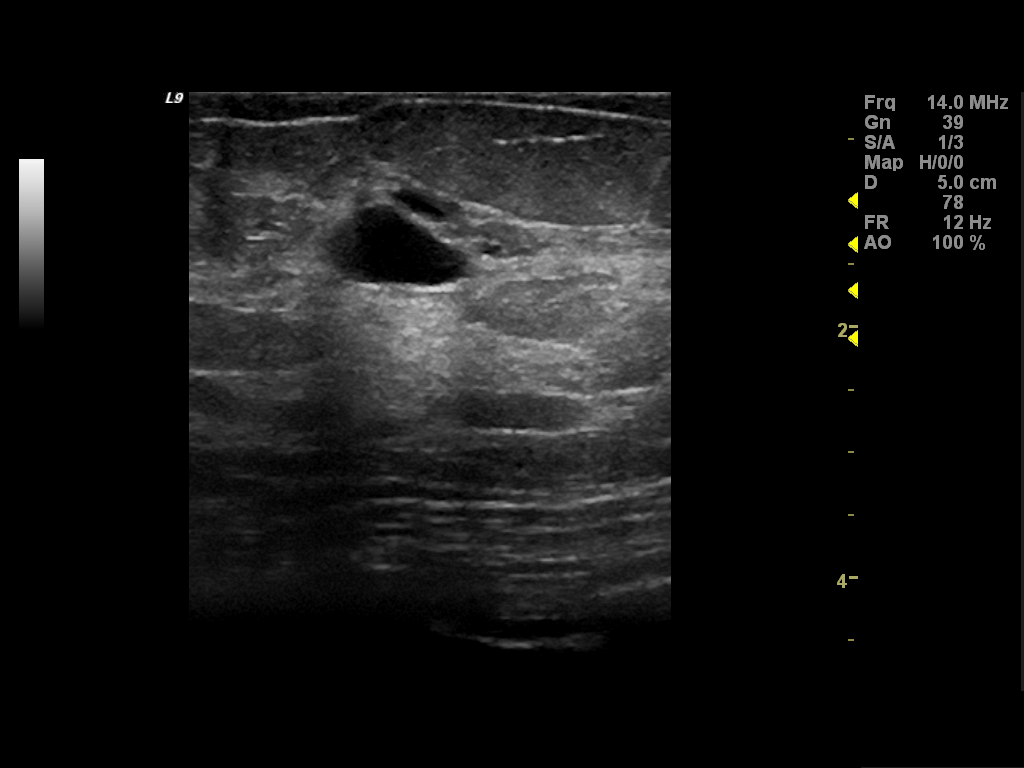
[im 2/5]
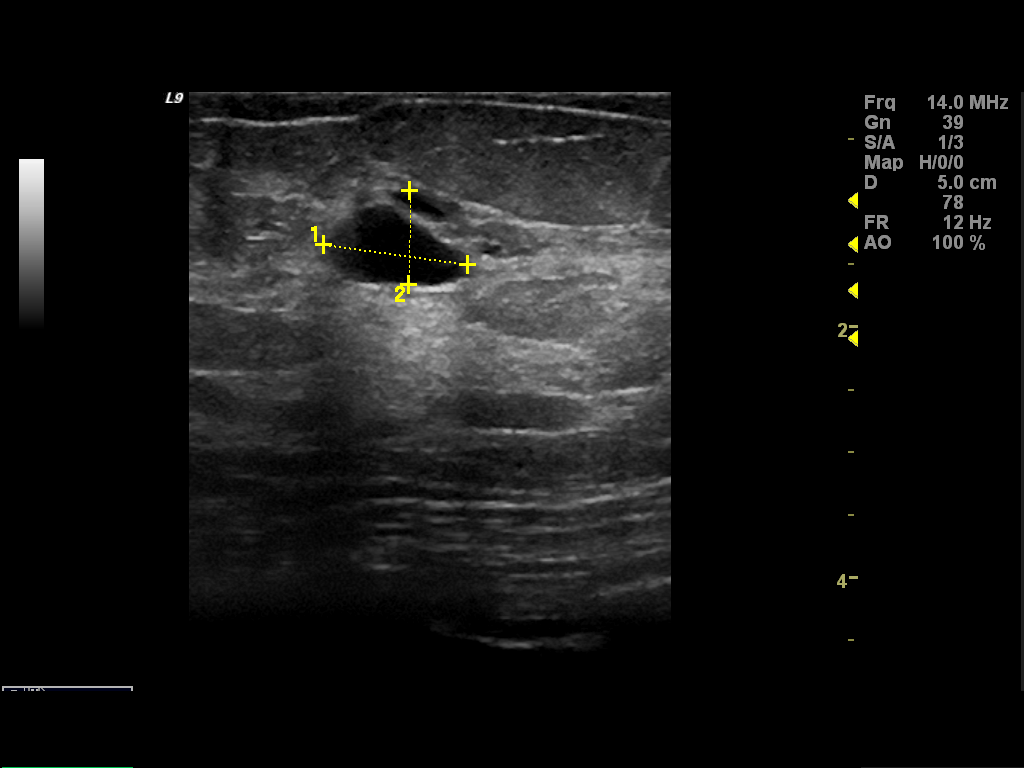
[im 3/5]
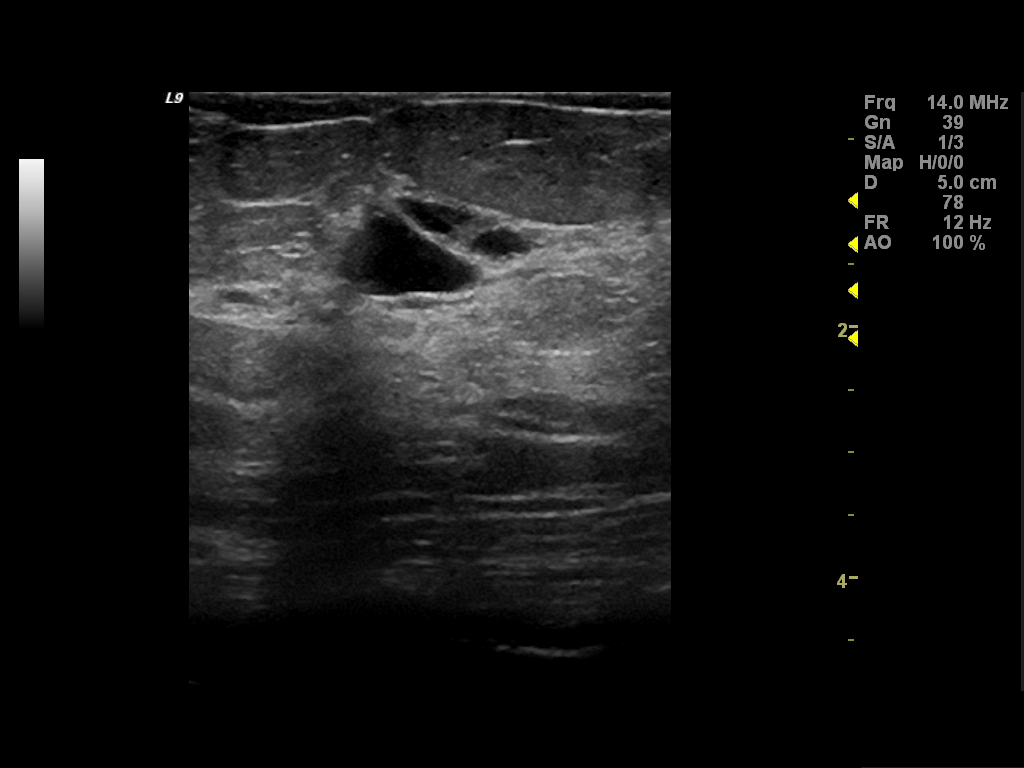
[im 4/5]
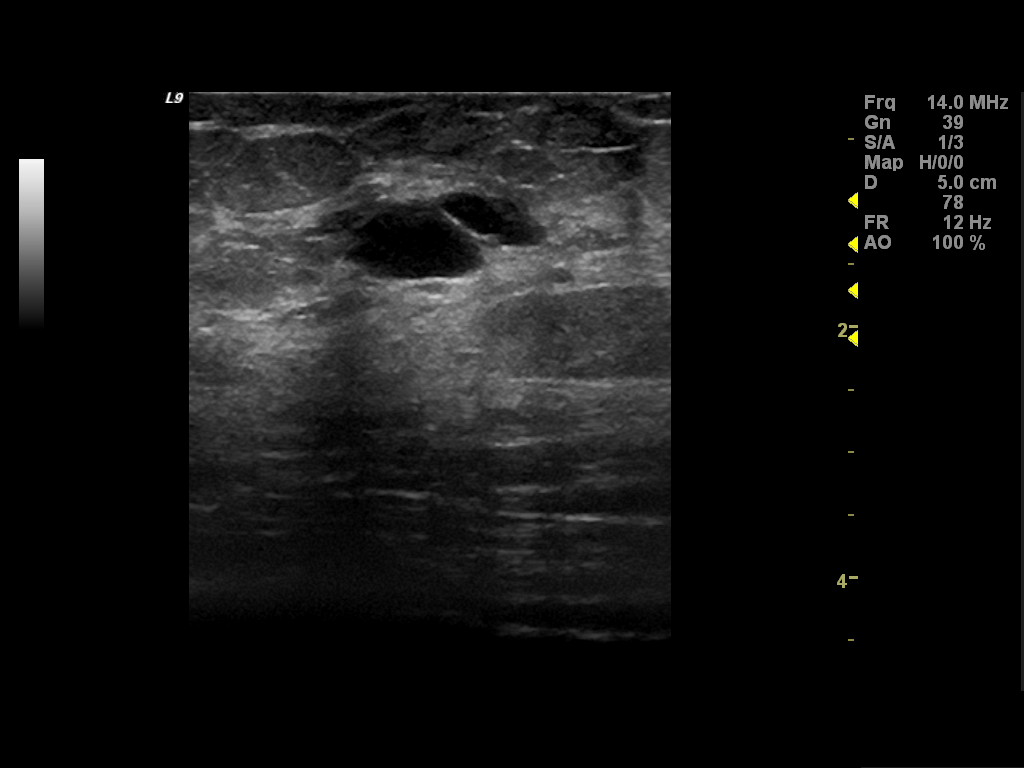
[im 5/5]
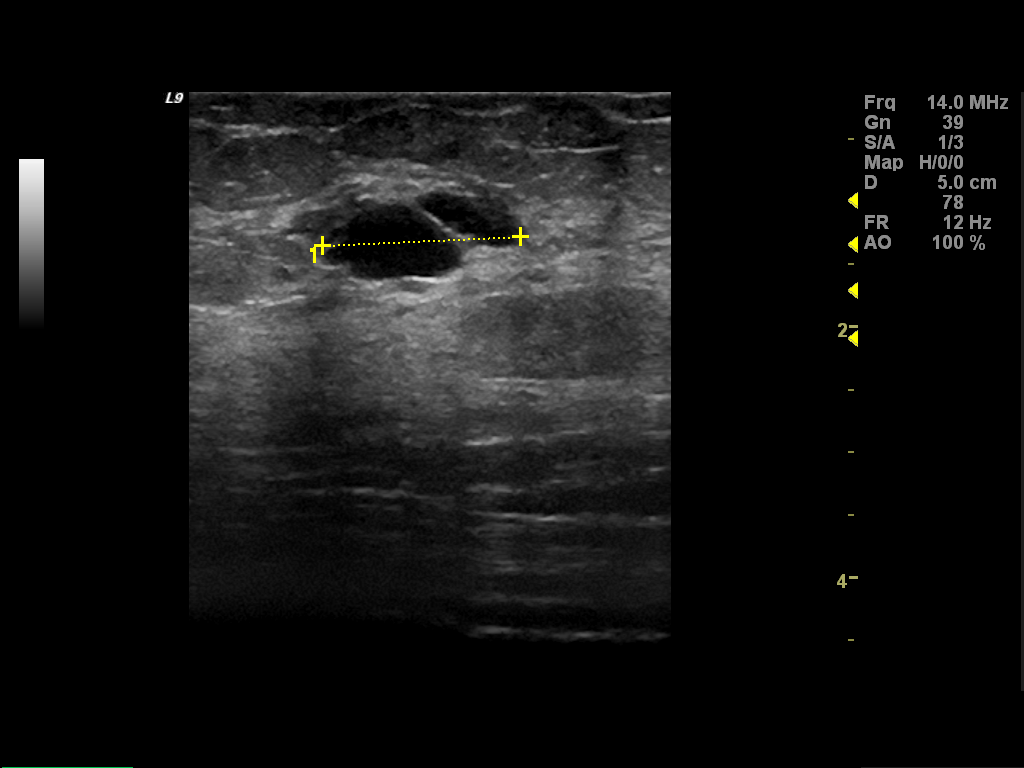

[5 of 5 positions shown; findings below may reference images not displayed]

FINDINGS: Spot compression views in the upper-outer aspect of the
right breast confirm the presence of fibroglandular tissue and
loosely grouped punctate and round calcifications.  Compared with
priors.

On physical exam, I palpate normal tissue in the upper-outer
quadrant of the right breast.

Ultrasound is performed, showing a cluster of cysts measuring a
maximum of 1.2 x 0.8 x 1.6 cm corresponding to the area of
mammographic concern.
IMPRESSION: Cysts, right breast.  No mammographic evidence of malignancy.
Recommend routine screening mammography in 1 year.

BI-RADS CATEGORY 2:  Benign finding(s).

## 2010-03-08 ENCOUNTER — Encounter: Payer: Self-pay | Admitting: Obstetrics and Gynecology

## 2010-07-03 NOTE — Op Note (Signed)
Laura Shields, MAS                ACCOUNT NO.:  [id number]   MEDICAL RECORD NO.:  [id number]          PATIENT TYPE:  AMB   LOCATION:  [facility]                           FACILITY:  [facility]   PHYSICIAN:  Duke Salvia. Marcelle Overlie, M.D.DATE OF BIRTH:  1955/08/31   DATE OF PROCEDURE:  03/04/2006  DATE OF DISCHARGE:                               OPERATIVE REPORT   PREOP DIAGNOSIS:  Endometrial polyp, menorrhagia.   POSTOP DIAGNOSIS:  Endometrial polyp, menorrhagia.   PROCEDURE:  Diagnostic hysteroscopy with dilation and curettage and  NovaSure endometrial ablation.   SURGEON:  Duke Salvia. Marcelle Overlie, M.D.   ANESTHESIA:  Sedation plus paracervical block.   SPECIMENS REMOVED:  Endometrial curettings.   COMPLICATIONS:  None.   BLOOD LOSS:  Minimal.   PROCEDURE AND FINDINGS:  The patient was taken to the operating room.  After an adequate level of sedation was obtained with the legs in  stirrups, perineum and vagina prepped and draped in the usual manner for  vaginal procedures.  Bladder was drained, EUA carried out.  Uterus in  mid position, normal size, adnexa negative.  Speculum was positioned.  Cervix grasped with a tenaculum.  Paracervical block created by  infiltrating at 3 and 9 o'clock submucosally; followed by 7 mL of 1%  Xylocaine on either side after negative aspiration.  Uterus sounded to  8.5 cm, and progressively dilated to a 27-29 Pratt dilator.  A 7 mm  continuous flow hysteroscope was then used to perform hysteroscopy.  There was some endometrial buildup on the left side.  The right side and  fundus were otherwise unremarkable.  D&C carried out.  Specimen sent for  pathology.  The scope was reinserted; the cavity was irrigated; and no  other abnormalities were noted.   The NovaSure EMA was then carried out per protocol passing the CO2 test  with no immediate complications.  A power setting of 121 in the cavity  with 4.0 for 1 minute 31 seconds.  She went to recovery room in good  condition.      Richard M. Marcelle Overlie, M.D.  Electronically Signed     [facility]MED[facility]  D:  03/04/2006  T:  03/04/2006  Job:  [id number]

## 2010-07-03 NOTE — H&P (Signed)
Laura Shields, Laura Shields                ACCOUNT NO.:  [id number]   MEDICAL RECORD NO.:  [id number]          PATIENT TYPE:  AMB   LOCATION:                                FACILITY:  [facility]   PHYSICIAN:  Duke Salvia. Marcelle Overlie, M.D.DATE OF BIRTH:  Dec 01, 1955   DATE OF ADMISSION:  03/04/2006  DATE OF DISCHARGE:                              HISTORY & PHYSICAL   CHIEF COMPLAINT:  Menorrhagia.   HISTORY OF PRESENT ILLNESS:  A 55 year old G1, P1 patient whose patient  has had a prior vasectomy.  Recently, has been dealing with problems  related to menorrhagia.  Had remote removal of a cervical polyp which  was benign, and a follow up [facility] was scheduled in our office which showed  a well-defined endometrial polyp.  She presents at this time for [facility],  hysteroscopy, removal of the polyp and NovaSure EMA.  This procedure,  including risks of bleeding, infection, other complications that may  require open or additional surgery along with her expected recovery time  all reviewed with her which she understands and accepts.   ALLERGIES:  None.   PAST SURGICAL HISTORY:  Tubal ligation.   FAMILY HISTORY:  Significant for maternal grandmother with stomach  cancer.   PAST OBSTETRICS HISTORY:  She has had one vaginal delivery.   REVIEW OF SYSTEMS:  Otherwise, unremarkable.   PHYSICAL EXAMINATION:  VITAL SIGNS:  Temperature 92, blood pressure  120/78.  HEENT:  Unremarkable.  NECK:  Supple without masses.  LUNGS:  Clear.  CARDIOVASCULAR:  Regular rate and rhythm without murmurs, rubs or  gallops .  BREASTS:  Without masses.  ABDOMEN:  Soft, flat, nontender  PELVIC:  Normal external genitalia.  Vagina and cervix clear.  Uterus  mid position, normal size.  Adnexa negative.  EXTREMITIES:  Unremarkable.  NEUROLOGICAL:  Unremarkable.   IMPRESSION:  Menorrhagia with endometrial polyps.   PLAN:  D&C hysteroscopy with NovaSure EMA.  Procedure and risks reviewed  as above.      Richard M. Marcelle Overlie, M.D.  Electronically Signed     [facility]/MEDQ  D:  02/25/2006  T:  02/25/2006  Job:  [id number]

## 2010-09-28 ENCOUNTER — Other Ambulatory Visit: Payer: Self-pay | Admitting: Obstetrics and Gynecology

## 2010-09-28 DIAGNOSIS — Z1231 Encounter for screening mammogram for malignant neoplasm of breast: Secondary | ICD-10-CM

## 2010-10-01 ENCOUNTER — Ambulatory Visit
Admission: RE | Admit: 2010-10-01 | Discharge: 2010-10-01 | Disposition: A | Discharge status: Home / Self Care | Payer: BC Managed Care – PPO | Source: Ambulatory Visit | Attending: Obstetrics and Gynecology | Admitting: Obstetrics and Gynecology

## 2010-10-01 DIAGNOSIS — Z1231 Encounter for screening mammogram for malignant neoplasm of breast: Secondary | ICD-10-CM

## 2011-09-06 ENCOUNTER — Other Ambulatory Visit: Payer: Self-pay | Admitting: Obstetrics and Gynecology

## 2011-09-06 DIAGNOSIS — Z1231 Encounter for screening mammogram for malignant neoplasm of breast: Secondary | ICD-10-CM

## 2011-10-04 ENCOUNTER — Ambulatory Visit: Payer: BC Managed Care – PPO

## 2011-10-12 ENCOUNTER — Ambulatory Visit: Payer: BC Managed Care – PPO

## 2011-10-12 ENCOUNTER — Ambulatory Visit
Admission: RE | Admit: 2011-10-12 | Discharge: 2011-10-12 | Disposition: A | Discharge status: Home / Self Care | Payer: BC Managed Care – PPO | Source: Ambulatory Visit | Attending: Obstetrics and Gynecology | Admitting: Obstetrics and Gynecology

## 2011-10-12 DIAGNOSIS — Z1231 Encounter for screening mammogram for malignant neoplasm of breast: Secondary | ICD-10-CM

## 2011-10-13 IMAGING — MG MM DIGITAL SCREENING BILAT
4 series · 4 of 4 positions shown · non-contrast
Comparison: Previous exams.

CLINICAL DATA: Screening.

DIGITAL BILATERAL SCREENING MAMMOGRAM WITH CAD

[R CC]
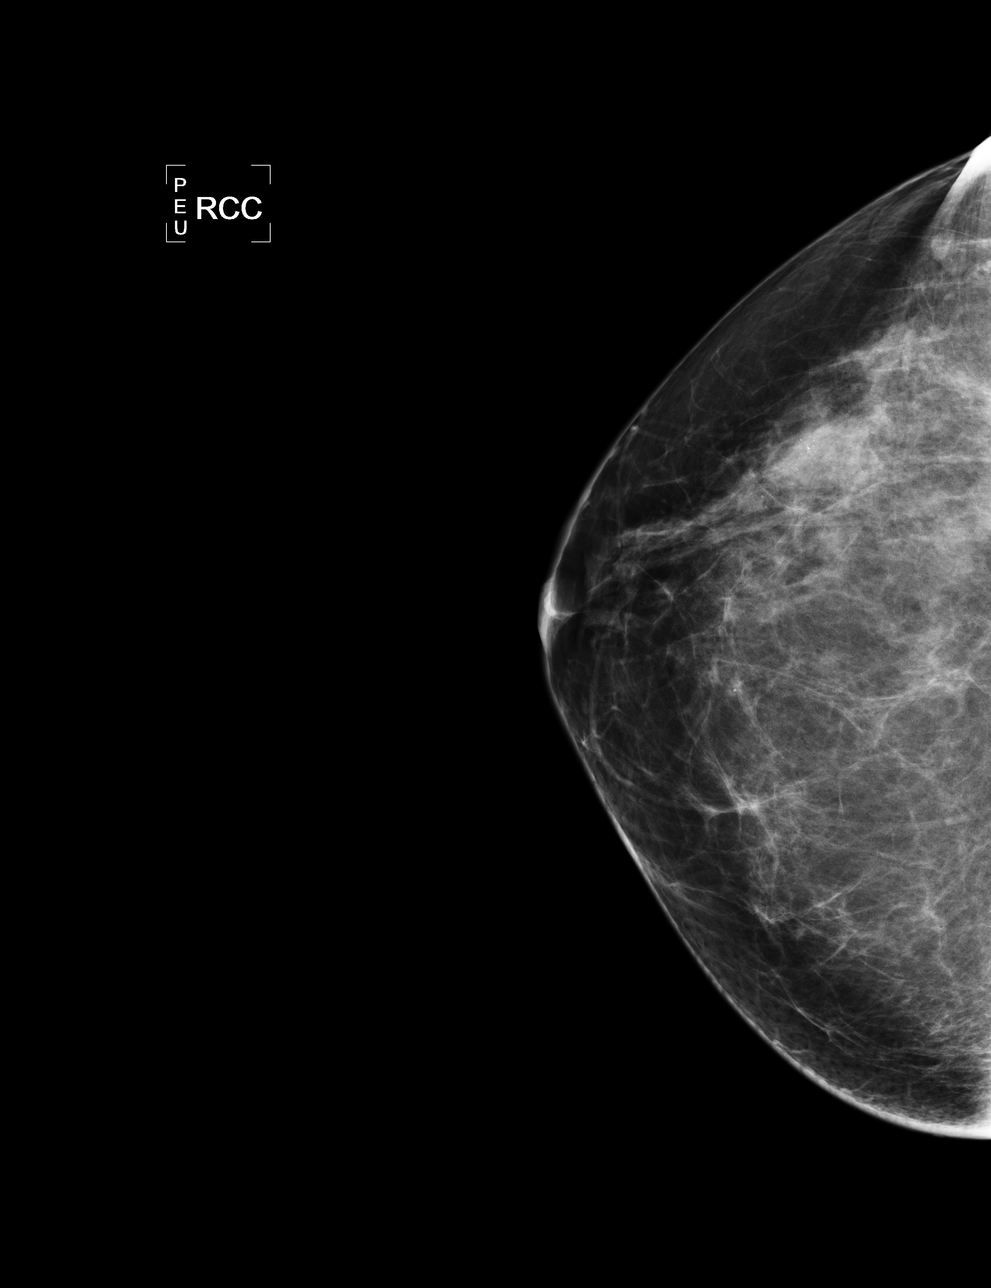

[L CC]
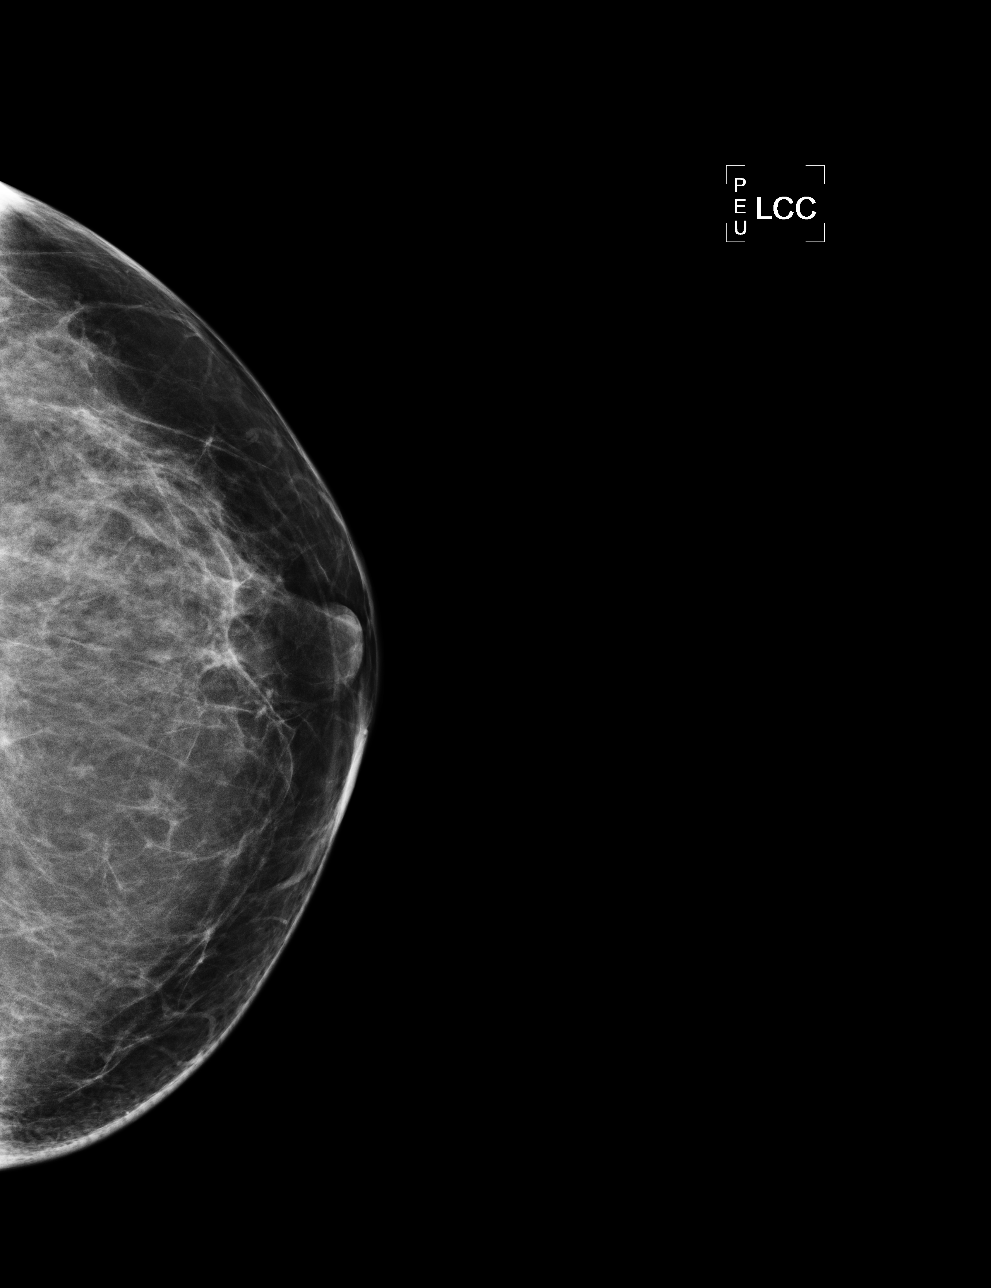

[L MLO]
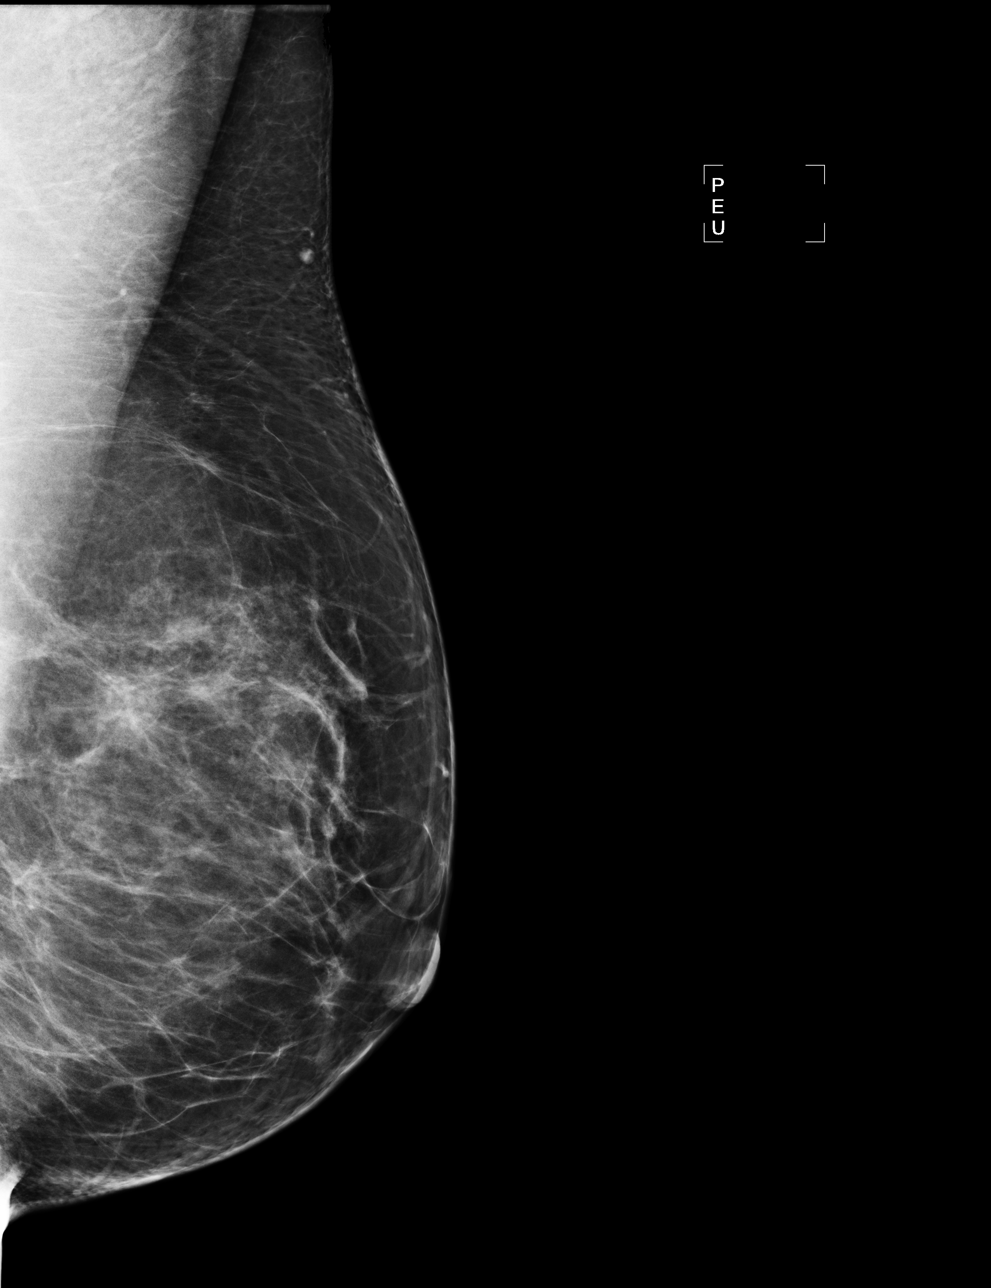

[R MLO]
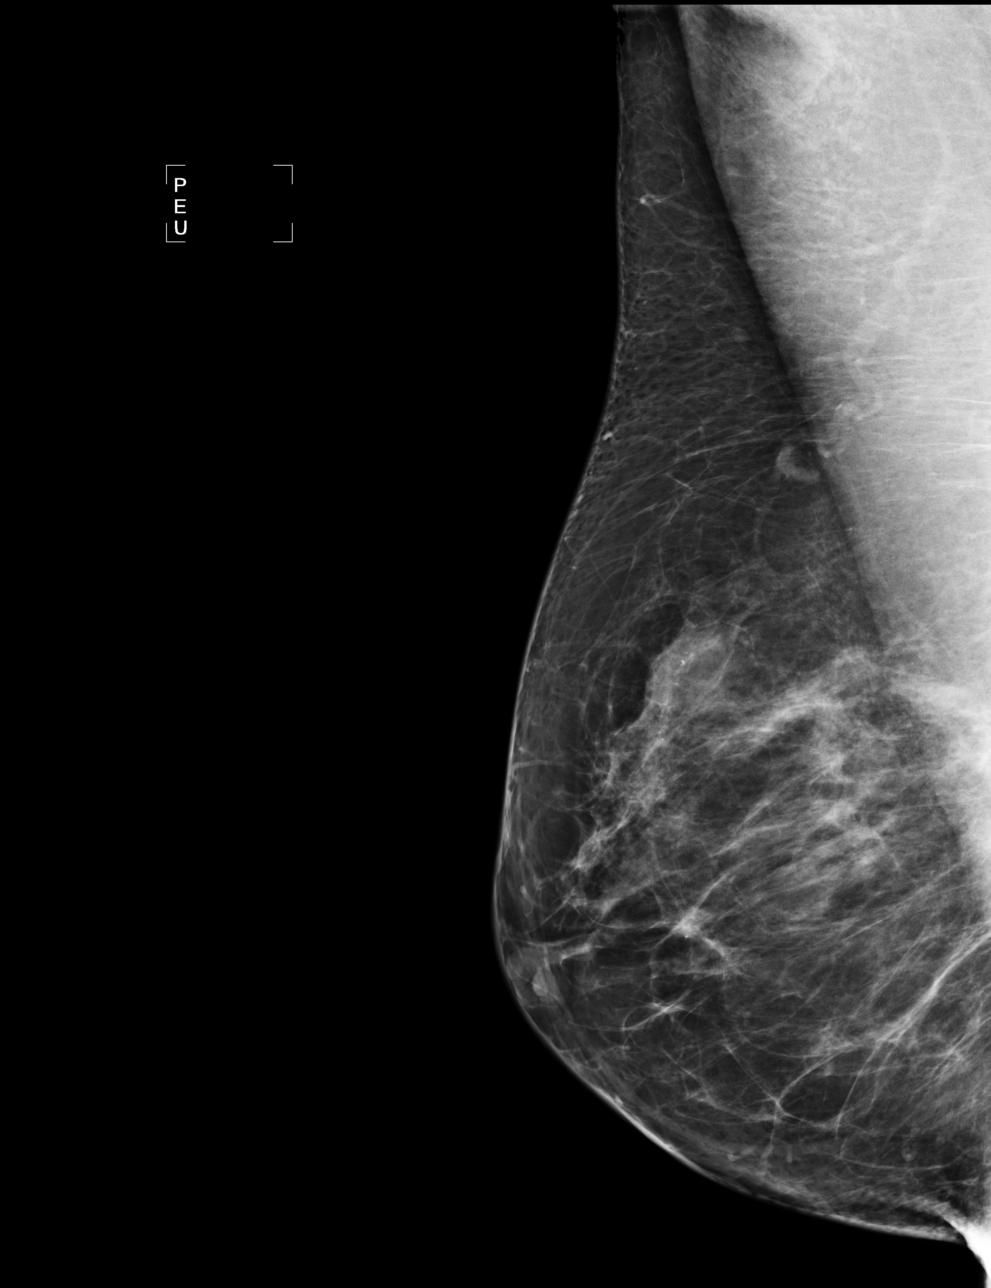

[4 of 4 positions shown; findings below may reference images not displayed]

FINDINGS: There are scattered fibroglandular densities. No
suspicious masses, architectural distortion, or calcifications are
present.

Images were processed with CAD.
IMPRESSION: No mammographic evidence of malignancy.

A result letter of this screening mammogram will be mailed directly
to the patient.

RECOMMENDATION:
Screening mammogram in one year. (Code:[XG])

BI-RADS CATEGORY 1:  Negative.

## 2012-11-23 ENCOUNTER — Other Ambulatory Visit: Payer: Self-pay

## 2012-11-23 DIAGNOSIS — Z1231 Encounter for screening mammogram for malignant neoplasm of breast: Secondary | ICD-10-CM

## 2012-12-05 ENCOUNTER — Ambulatory Visit
Admission: RE | Admit: 2012-12-05 | Discharge: 2012-12-05 | Disposition: A | Discharge status: Home / Self Care | Payer: BC Managed Care – PPO | Source: Ambulatory Visit

## 2012-12-05 DIAGNOSIS — Z1231 Encounter for screening mammogram for malignant neoplasm of breast: Secondary | ICD-10-CM

## 2013-11-27 ENCOUNTER — Other Ambulatory Visit: Payer: Self-pay

## 2013-11-27 DIAGNOSIS — Z1239 Encounter for other screening for malignant neoplasm of breast: Secondary | ICD-10-CM

## 2013-12-04 ENCOUNTER — Other Ambulatory Visit: Payer: Self-pay

## 2013-12-04 DIAGNOSIS — Z1231 Encounter for screening mammogram for malignant neoplasm of breast: Secondary | ICD-10-CM

## 2013-12-06 ENCOUNTER — Ambulatory Visit
Admission: RE | Admit: 2013-12-06 | Discharge: 2013-12-06 | Disposition: A | Discharge status: Home / Self Care | Payer: BC Managed Care – PPO | Source: Ambulatory Visit

## 2013-12-06 DIAGNOSIS — Z1231 Encounter for screening mammogram for malignant neoplasm of breast: Secondary | ICD-10-CM

## 2013-12-06 IMAGING — MG MM SCREEN MAMMOGRAM BILATERAL
4 series · 4 of 4 positions shown · non-contrast
Comparison: Previous exam(s).

CLINICAL DATA: Screening.

EXAM:
DIGITAL SCREENING BILATERAL MAMMOGRAM WITH CAD

[R CC]
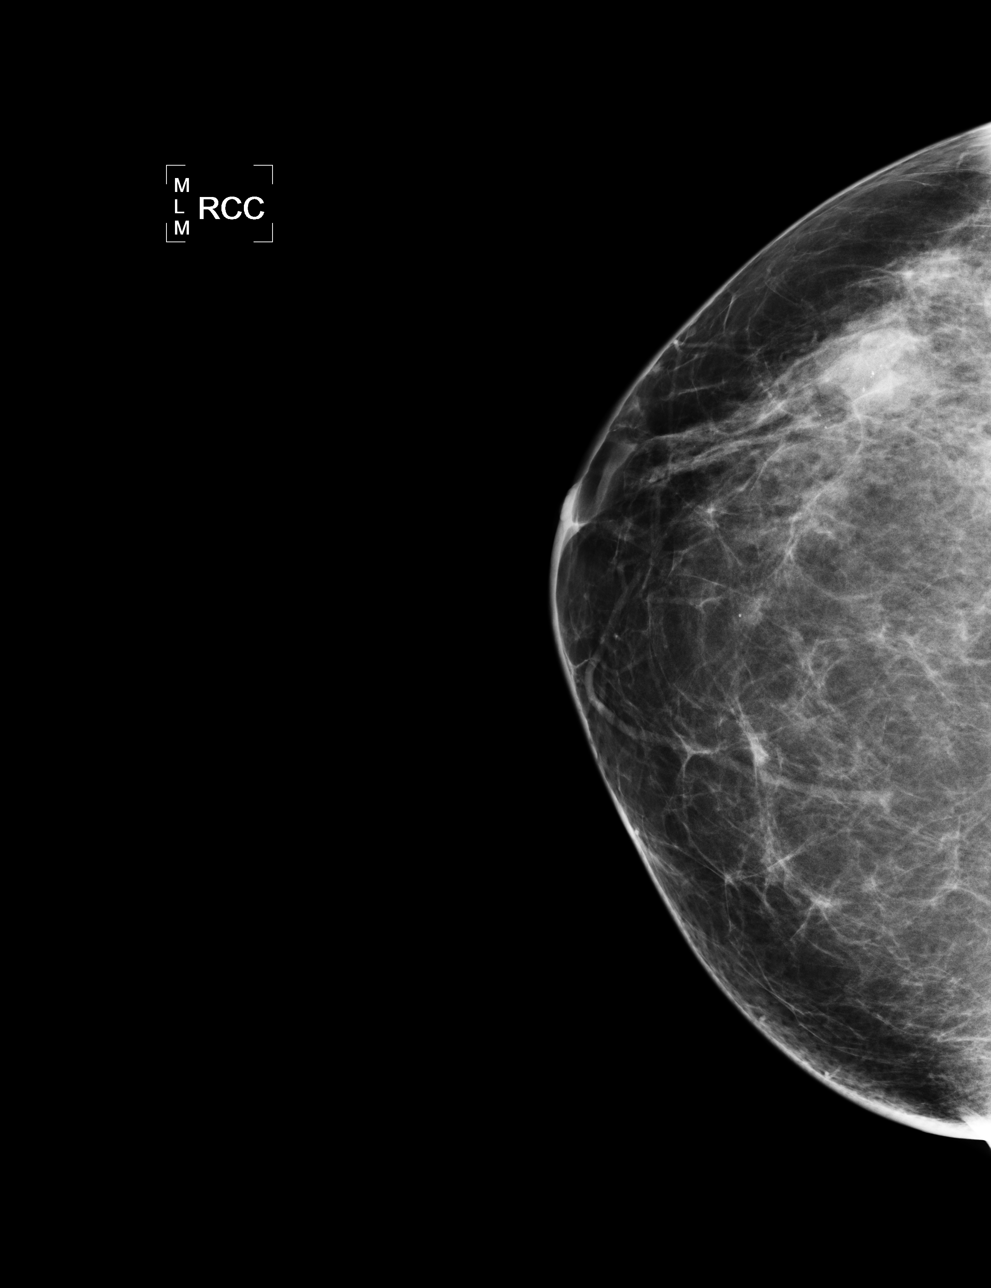

[L CC]
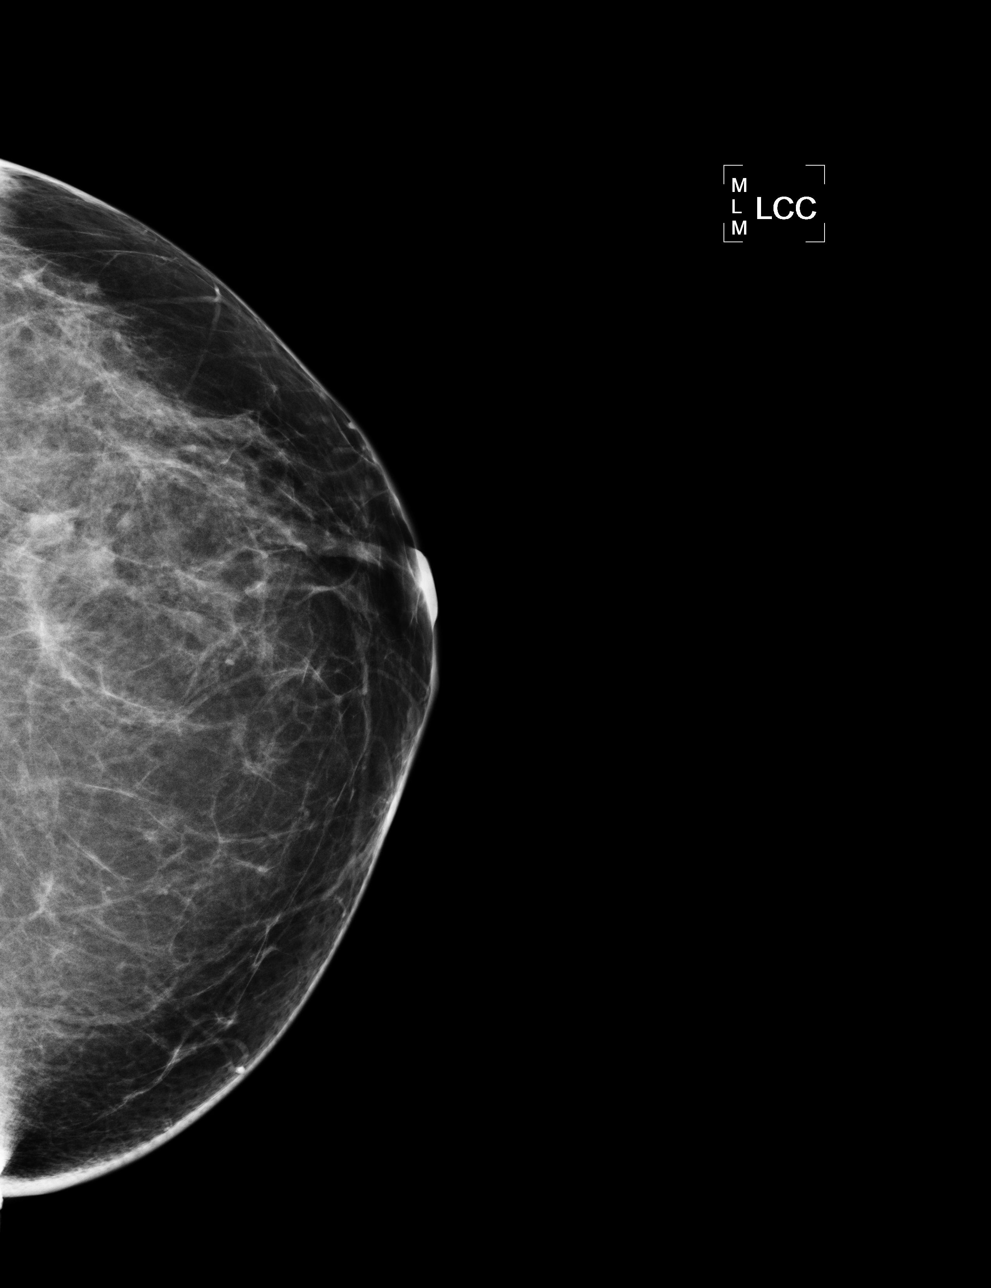

[L MLO]
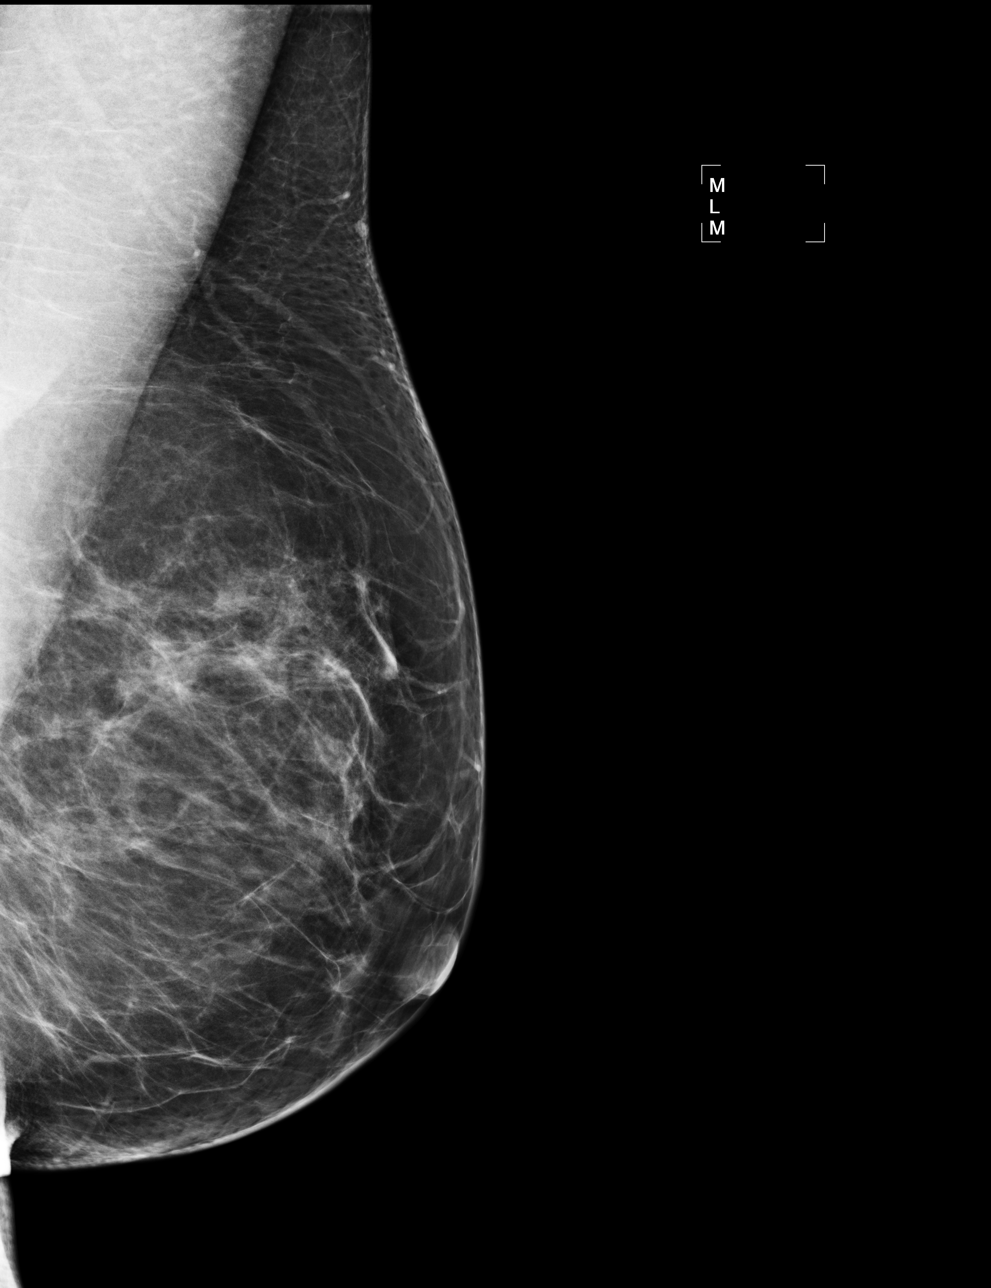

[R MLO]
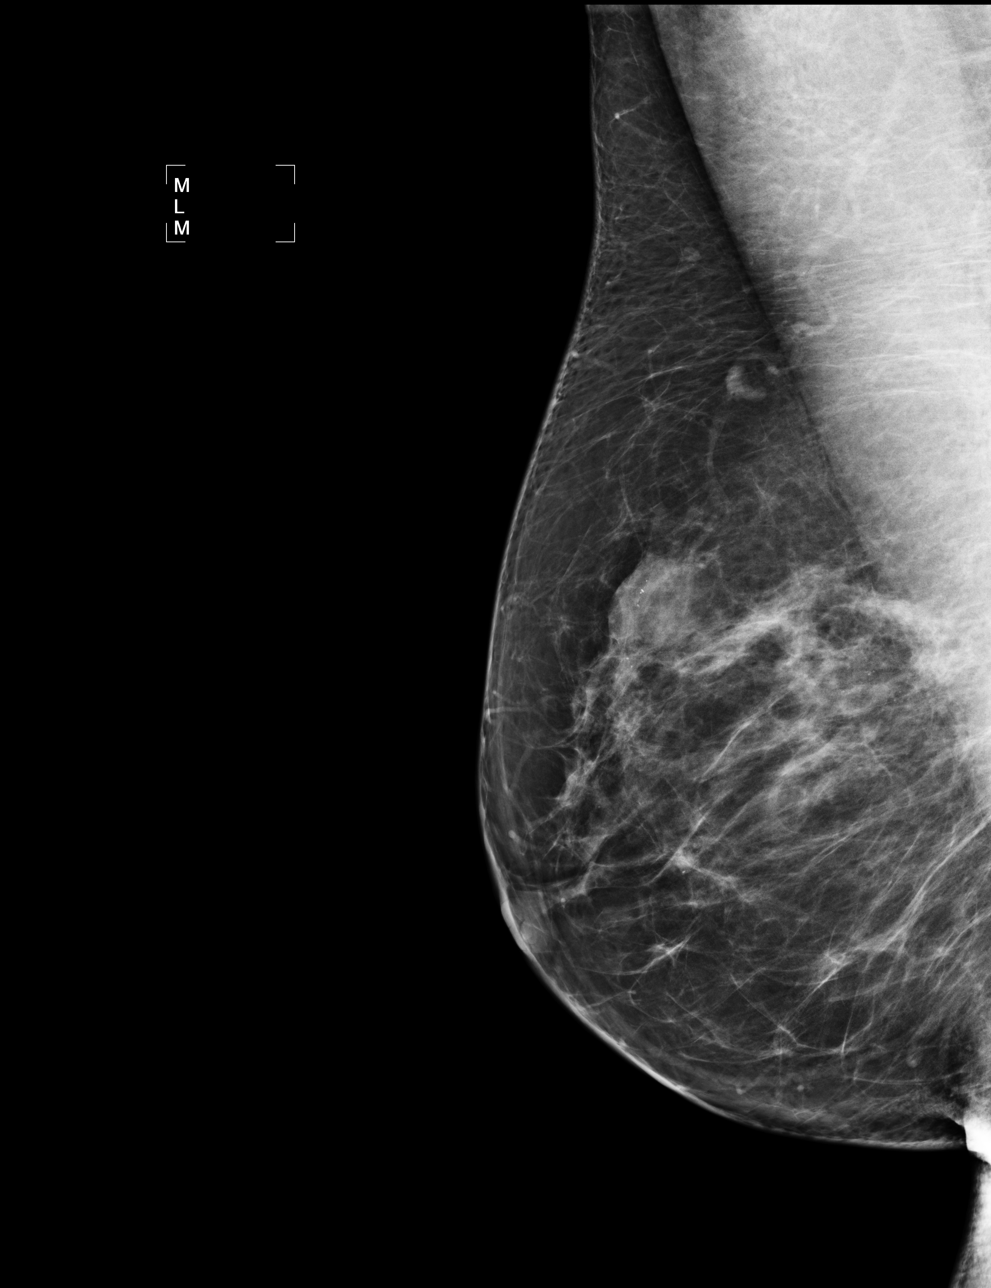

[4 of 4 positions shown; findings below may reference images not displayed]

ACR Breast Density Category c: The breast tissue is heterogeneously
dense, which may obscure small masses.
FINDINGS: There are no findings suspicious for malignancy. Images were
processed with CAD.
IMPRESSION: No mammographic evidence of malignancy. A result letter of this
screening mammogram will be mailed directly to the patient.

RECOMMENDATION:
Screening mammogram in one year. (Code:[0J])

BI-RADS CATEGORY  1: Negative.

## 2014-12-04 ENCOUNTER — Other Ambulatory Visit: Payer: Self-pay

## 2014-12-04 DIAGNOSIS — Z1231 Encounter for screening mammogram for malignant neoplasm of breast: Secondary | ICD-10-CM

## 2014-12-18 ENCOUNTER — Ambulatory Visit: Payer: BC Managed Care – PPO

## 2014-12-23 ENCOUNTER — Ambulatory Visit
Admission: RE | Admit: 2014-12-23 | Discharge: 2014-12-23 | Disposition: A | Discharge status: Home / Self Care | Payer: BC Managed Care – PPO | Source: Ambulatory Visit

## 2014-12-23 DIAGNOSIS — Z1231 Encounter for screening mammogram for malignant neoplasm of breast: Secondary | ICD-10-CM

## 2015-11-26 ENCOUNTER — Other Ambulatory Visit: Payer: Self-pay | Admitting: Family Medicine

## 2015-11-26 DIAGNOSIS — Z1231 Encounter for screening mammogram for malignant neoplasm of breast: Secondary | ICD-10-CM

## 2015-12-29 ENCOUNTER — Ambulatory Visit
Admission: RE | Admit: 2015-12-29 | Discharge: 2015-12-29 | Disposition: A | Discharge status: Home / Self Care | Payer: BC Managed Care – PPO | Source: Ambulatory Visit | Attending: Family Medicine | Admitting: Family Medicine

## 2015-12-29 DIAGNOSIS — Z1231 Encounter for screening mammogram for malignant neoplasm of breast: Secondary | ICD-10-CM

## 2017-01-05 ENCOUNTER — Other Ambulatory Visit: Payer: Self-pay | Admitting: Family Medicine

## 2017-01-05 ENCOUNTER — Other Ambulatory Visit: Payer: Self-pay

## 2017-01-05 DIAGNOSIS — Z1231 Encounter for screening mammogram for malignant neoplasm of breast: Secondary | ICD-10-CM

## 2017-02-04 ENCOUNTER — Ambulatory Visit: Payer: BC Managed Care – PPO

## 2017-03-04 ENCOUNTER — Encounter: Payer: Self-pay | Admitting: Radiology

## 2017-03-04 ENCOUNTER — Ambulatory Visit
Admission: RE | Admit: 2017-03-04 | Discharge: 2017-03-04 | Disposition: A | Discharge status: Home / Self Care | Payer: BC Managed Care – PPO | Source: Ambulatory Visit | Attending: Family Medicine | Admitting: Family Medicine

## 2017-03-04 DIAGNOSIS — Z1231 Encounter for screening mammogram for malignant neoplasm of breast: Secondary | ICD-10-CM

## 2017-03-04 IMAGING — MG DIGITAL SCREENING BILATERAL MAMMOGRAM WITH CAD
4 series · 4 of 4 positions shown · non-contrast
Comparison: Previous exam(s).

CLINICAL DATA: Screening.

EXAM:
DIGITAL SCREENING BILATERAL MAMMOGRAM WITH CAD

[L MLO]
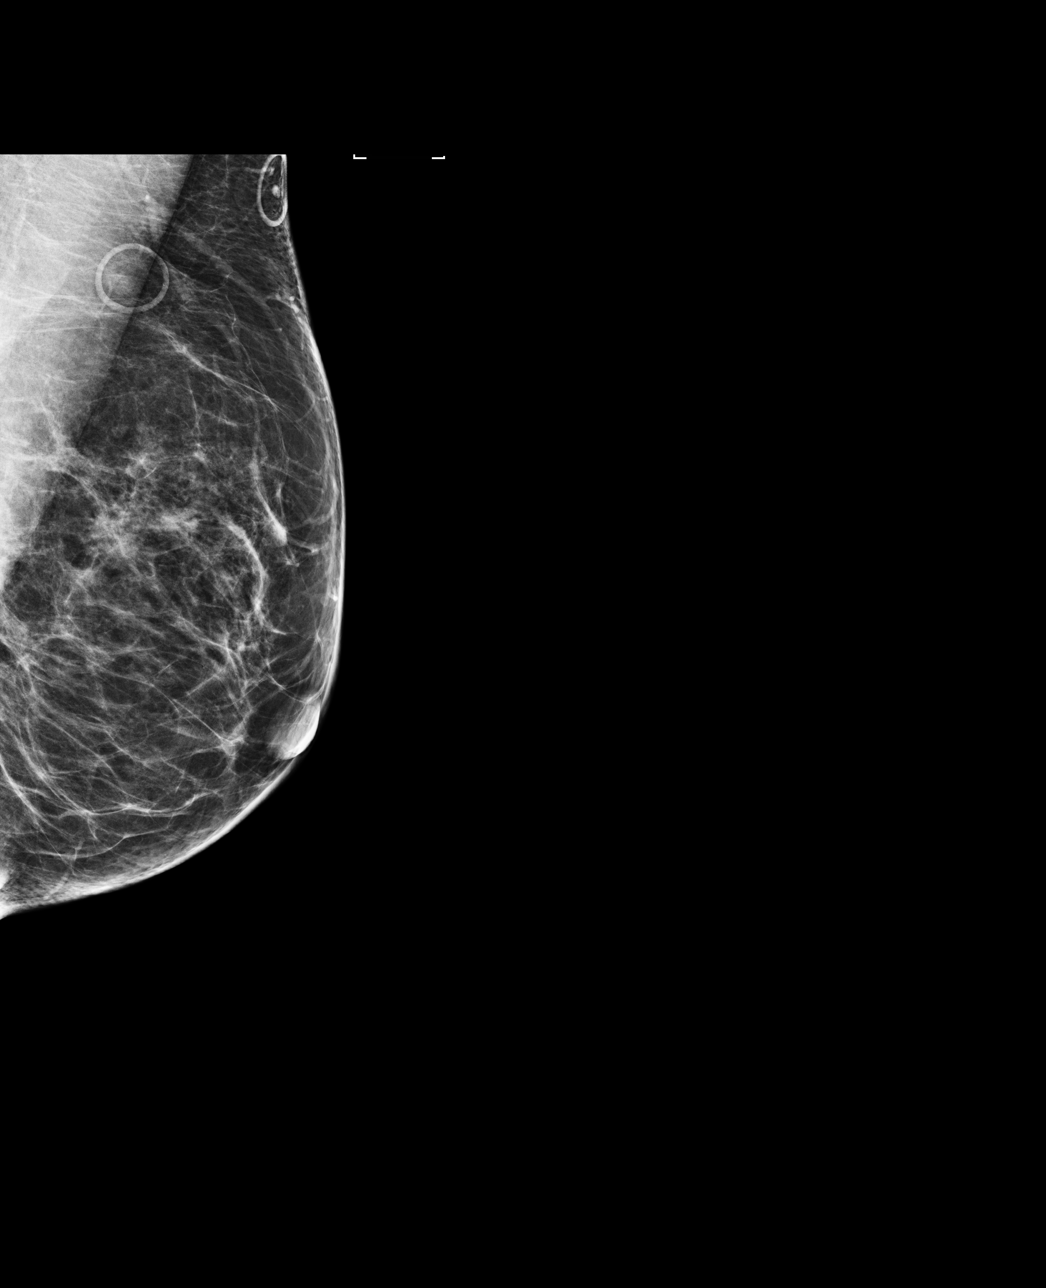

[R CC]
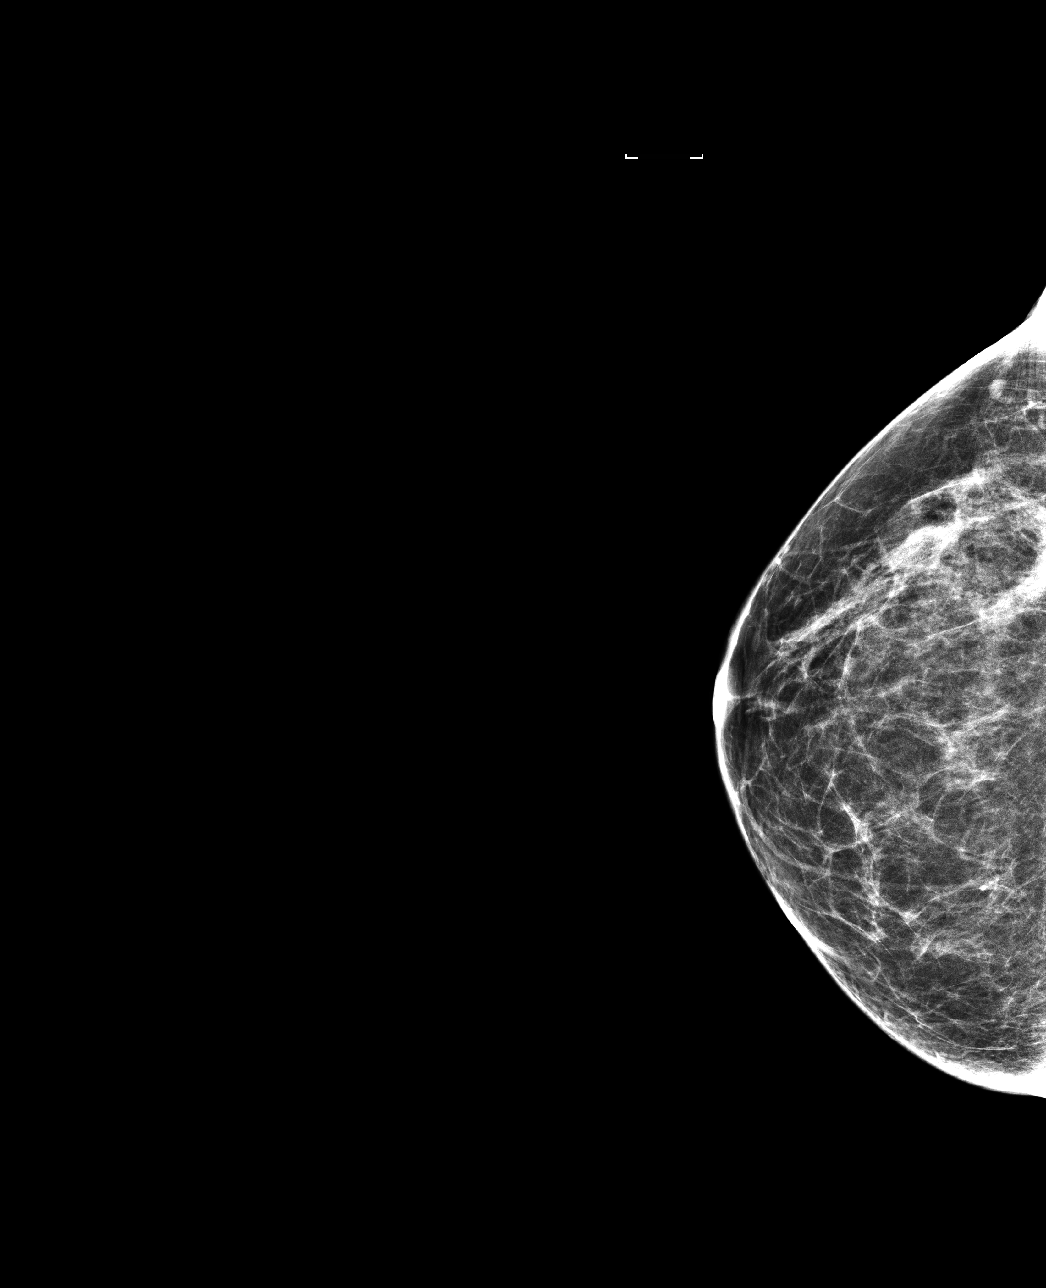

[L CC]
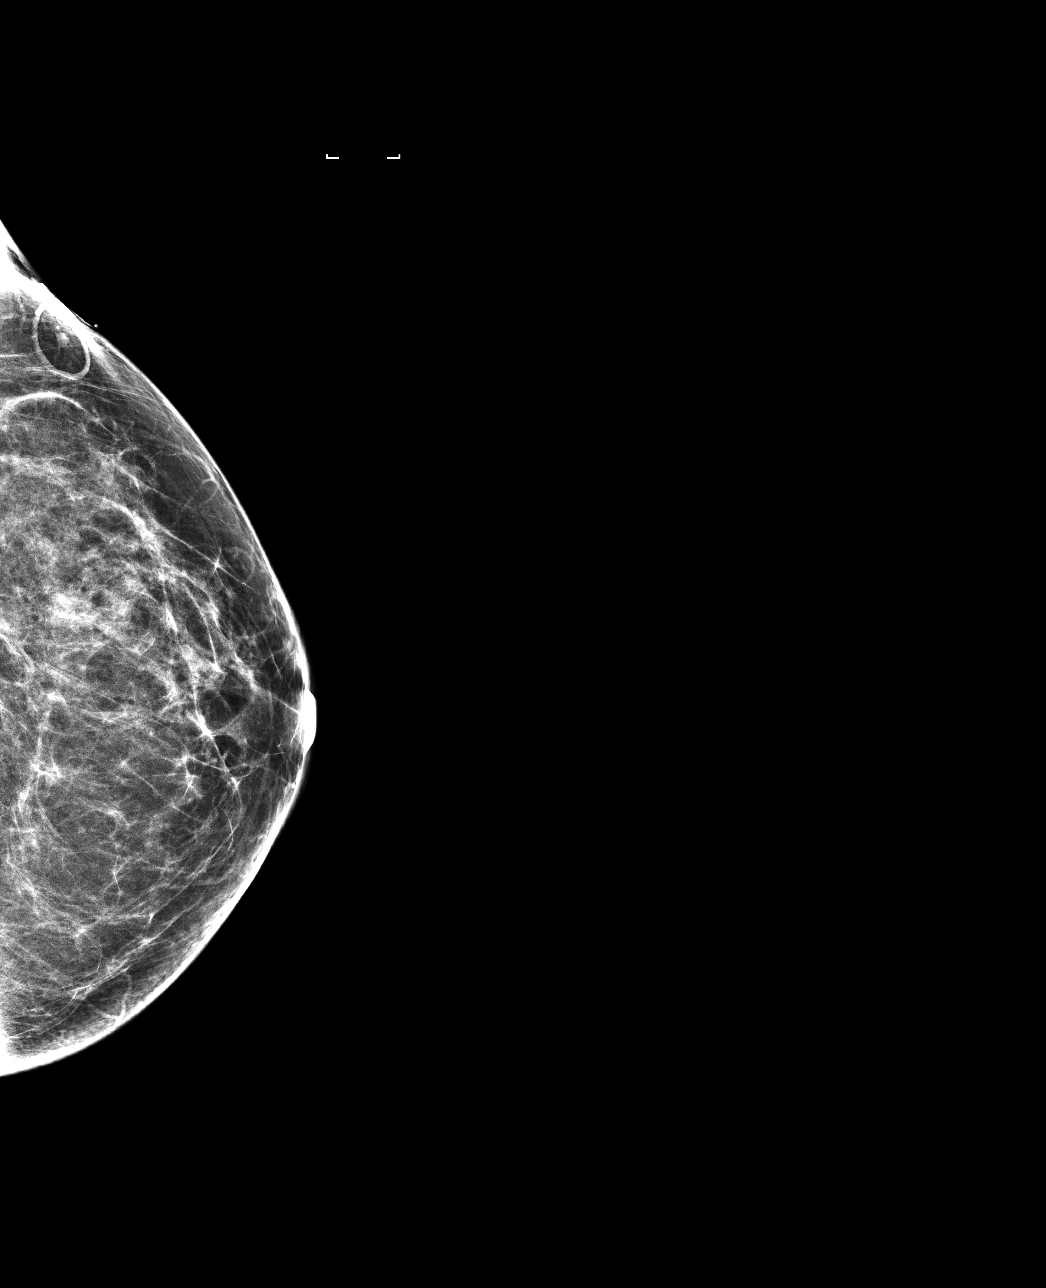

[R MLO]
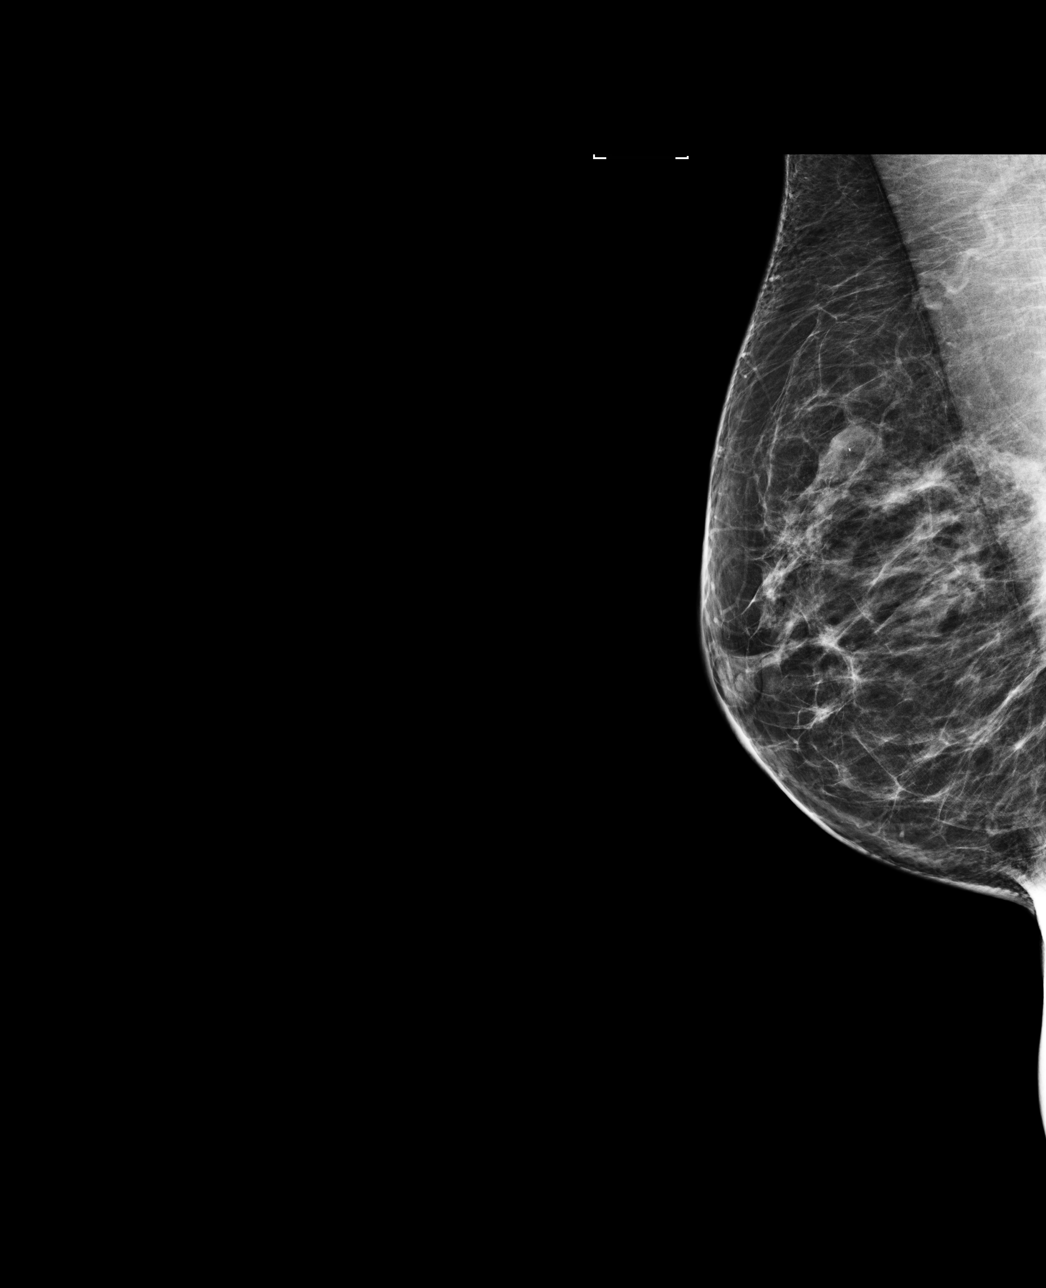

[4 of 4 positions shown; findings below may reference images not displayed]

ACR Breast Density Category b: There are scattered areas of
fibroglandular density.
FINDINGS: There are no findings suspicious for malignancy. Images were
processed with CAD.
IMPRESSION: No mammographic evidence of malignancy. A result letter of this
screening mammogram will be mailed directly to the patient.

RECOMMENDATION:
Screening mammogram in one year. (Code:[US])

BI-RADS CATEGORY  1: Negative.

## 2018-02-13 ENCOUNTER — Other Ambulatory Visit: Payer: Self-pay | Admitting: Family Medicine

## 2018-02-13 DIAGNOSIS — Z1231 Encounter for screening mammogram for malignant neoplasm of breast: Secondary | ICD-10-CM

## 2018-03-16 ENCOUNTER — Ambulatory Visit
Admission: RE | Admit: 2018-03-16 | Discharge: 2018-03-16 | Disposition: A | Discharge status: Home / Self Care | Payer: BC Managed Care – PPO | Source: Ambulatory Visit | Attending: Family Medicine | Admitting: Family Medicine

## 2018-03-16 DIAGNOSIS — Z1231 Encounter for screening mammogram for malignant neoplasm of breast: Secondary | ICD-10-CM

## 2018-03-16 IMAGING — MG DIGITAL SCREENING BILATERAL MAMMOGRAM WITH TOMO AND CAD
8 series · 9 of 24 positions shown · non-contrast
Comparison: Previous exam(s).

CLINICAL DATA: Screening.

EXAM:
DIGITAL SCREENING BILATERAL MAMMOGRAM WITH TOMO AND CAD

[L MLO synth-2D]
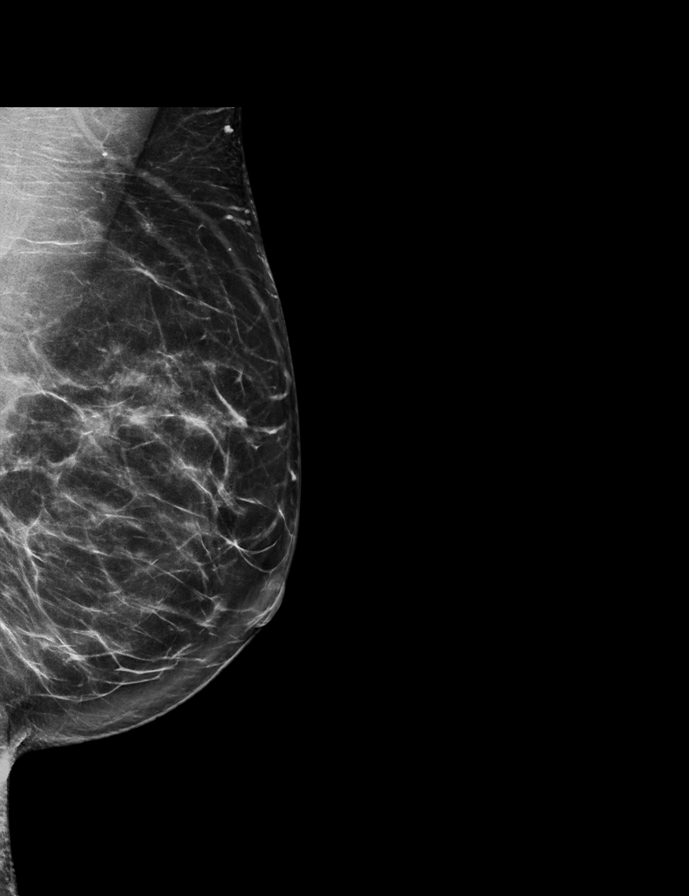

[R CC synth-2D]
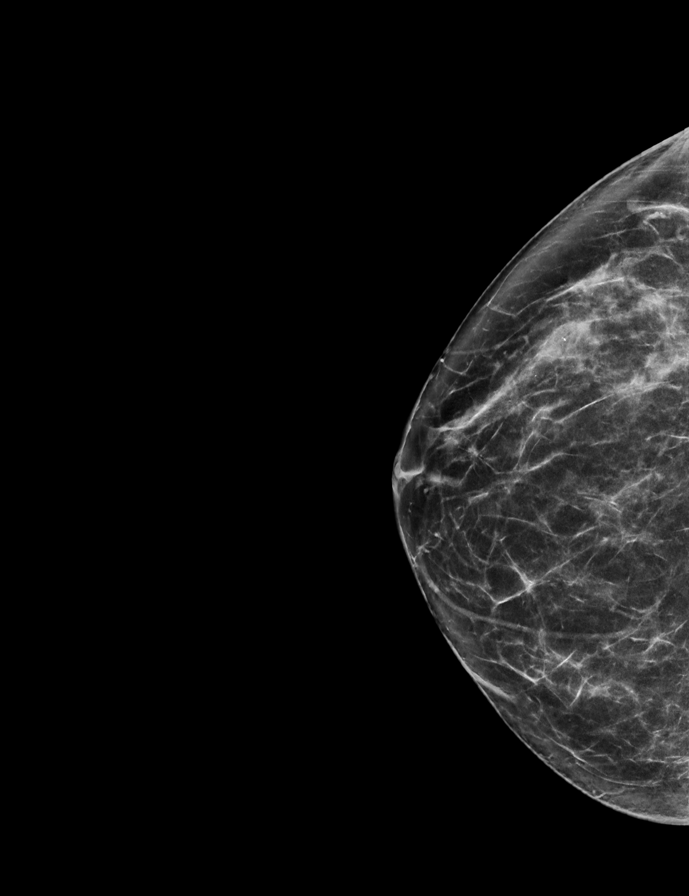

[R MLO synth-2D]
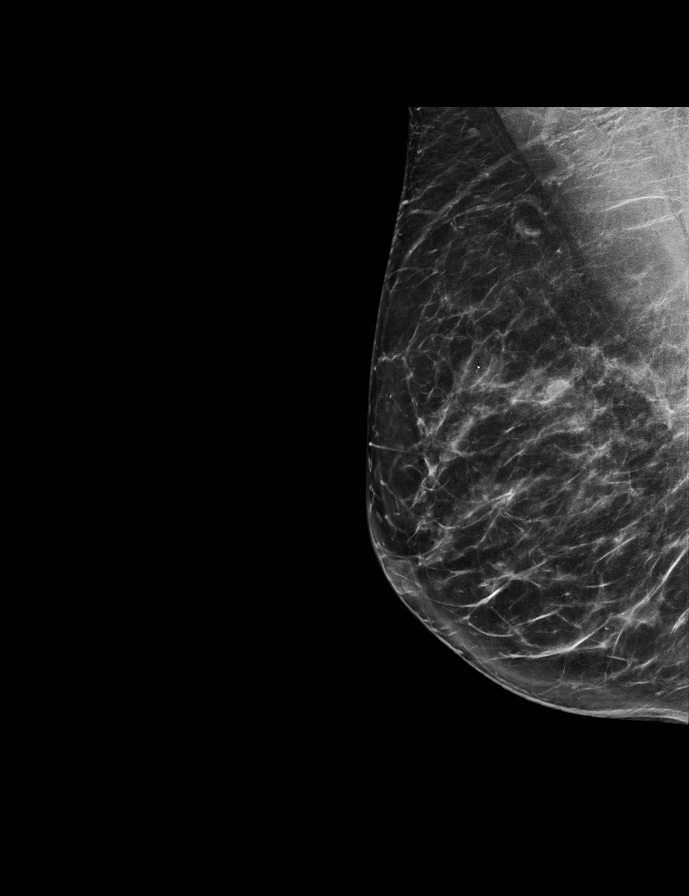

[L CC synth-2D]
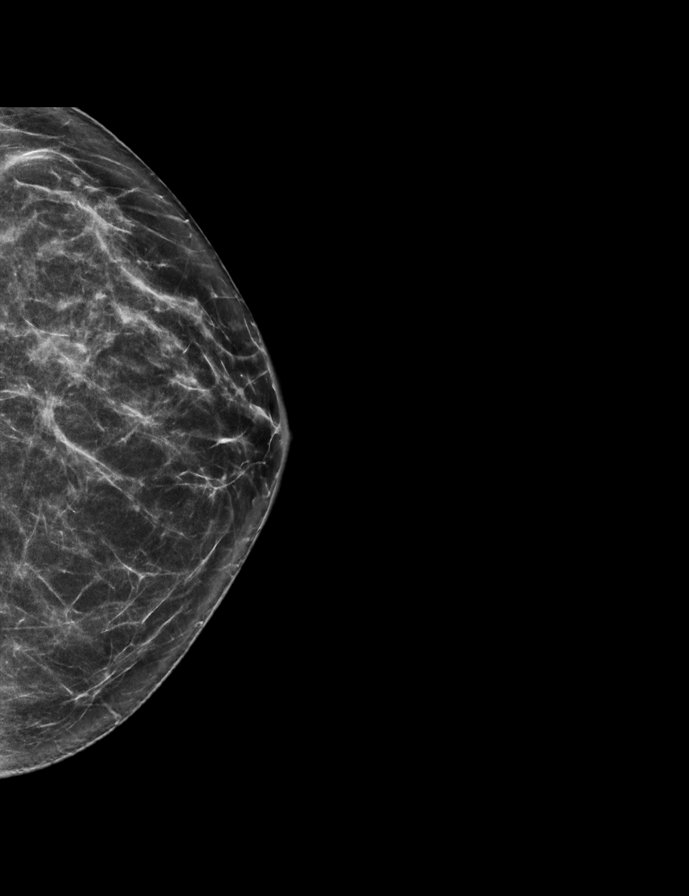

[L MLO tomo · 2 of 76 frames shown]
[frame 25/76]
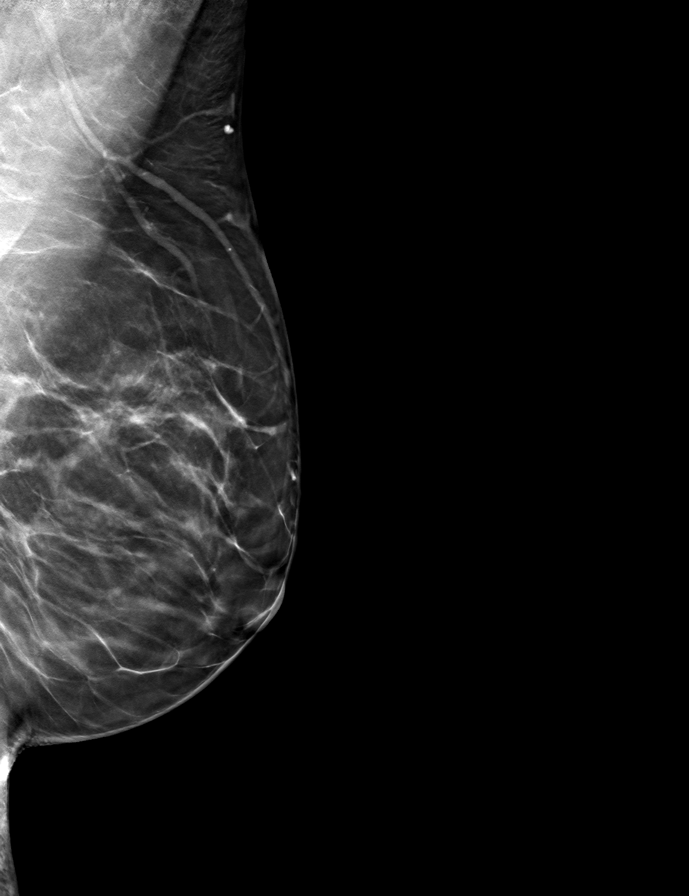
[frame 39/76]
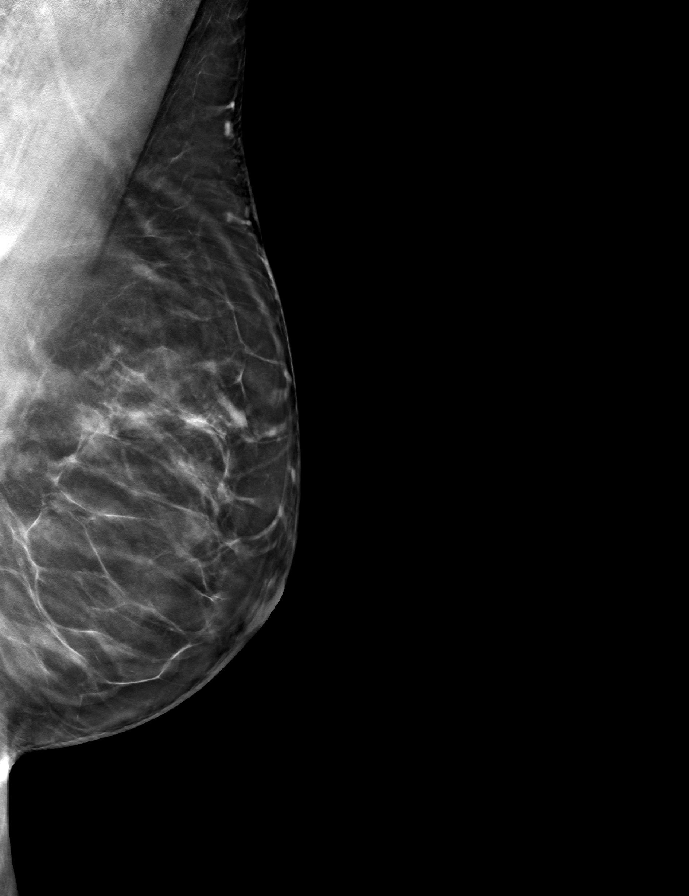

[R CC tomo · tomo slice 35/69.0]
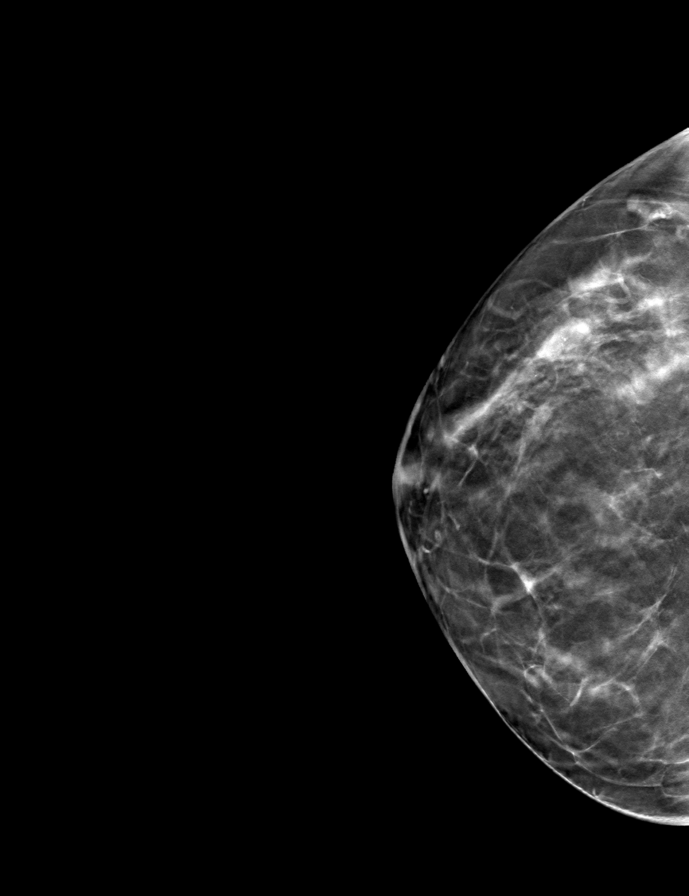

[R MLO tomo · tomo slice 35/70.0]
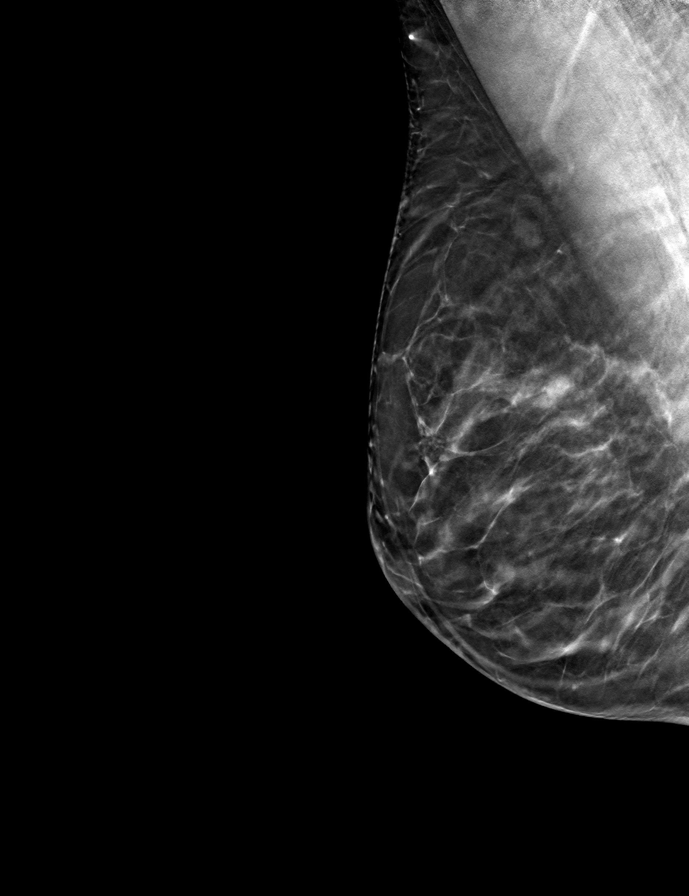

[L CC tomo · tomo slice 33/64.0]
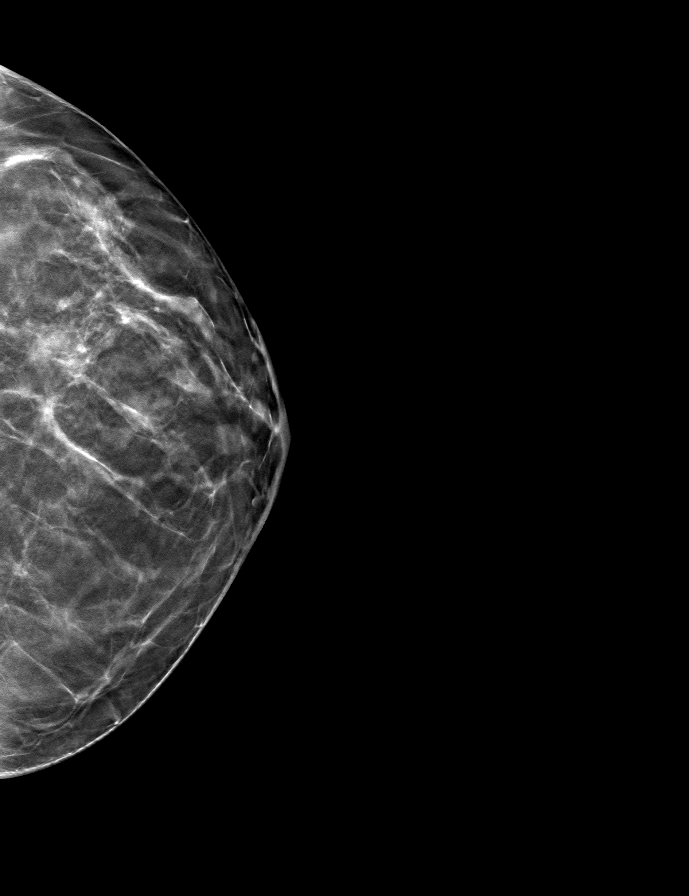

[9 of 24 positions shown; findings below may reference images not displayed]

ACR Breast Density Category b: There are scattered areas of
fibroglandular density.
FINDINGS: There are no findings suspicious for malignancy. Images were
processed with CAD.
IMPRESSION: No mammographic evidence of malignancy. A result letter of this
screening mammogram will be mailed directly to the patient.

RECOMMENDATION:
Screening mammogram in one year. (Code:[TQ])

BI-RADS CATEGORY  1: Negative.

## 2019-02-06 ENCOUNTER — Other Ambulatory Visit: Payer: Self-pay | Admitting: Family Medicine

## 2019-02-06 DIAGNOSIS — Z1231 Encounter for screening mammogram for malignant neoplasm of breast: Secondary | ICD-10-CM

## 2019-03-23 ENCOUNTER — Ambulatory Visit: Payer: BC Managed Care – PPO

## 2019-04-02 ENCOUNTER — Other Ambulatory Visit: Payer: Self-pay

## 2019-04-02 ENCOUNTER — Ambulatory Visit
Admission: RE | Admit: 2019-04-02 | Discharge: 2019-04-02 | Disposition: A | Discharge status: Home / Self Care | Payer: BC Managed Care – PPO | Source: Ambulatory Visit | Attending: Family Medicine | Admitting: Family Medicine

## 2019-04-02 DIAGNOSIS — Z1231 Encounter for screening mammogram for malignant neoplasm of breast: Secondary | ICD-10-CM

## 2019-04-02 IMAGING — MG DIGITAL SCREENING BILAT W/ TOMO W/ CAD
8 series · 9 of 24 positions shown · non-contrast
Comparison: Previous exam(s).

CLINICAL DATA: Screening.

EXAM:
DIGITAL SCREENING BILATERAL MAMMOGRAM WITH TOMO AND CAD

[R CC synth-2D]
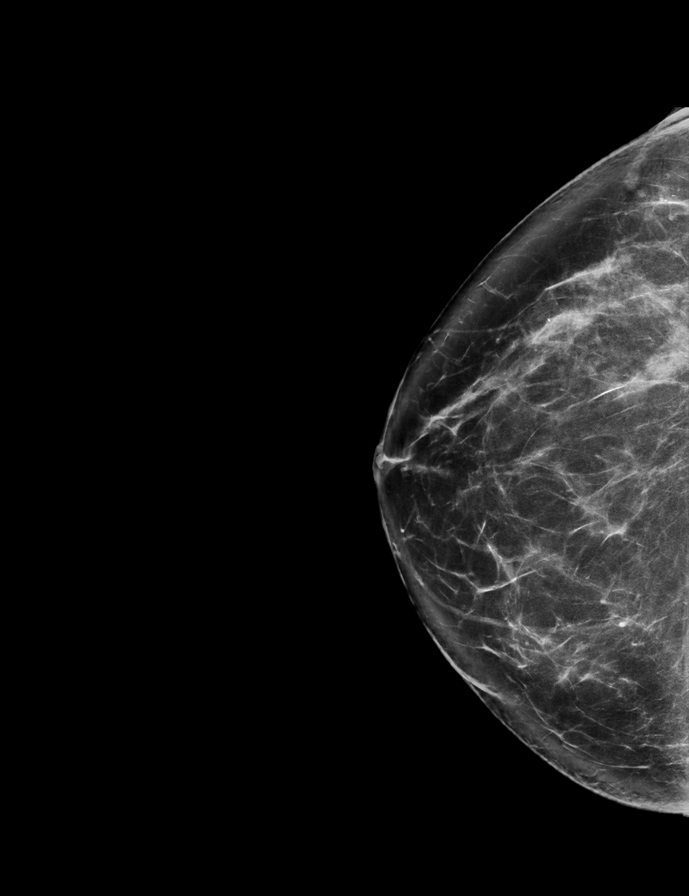

[L CC synth-2D]
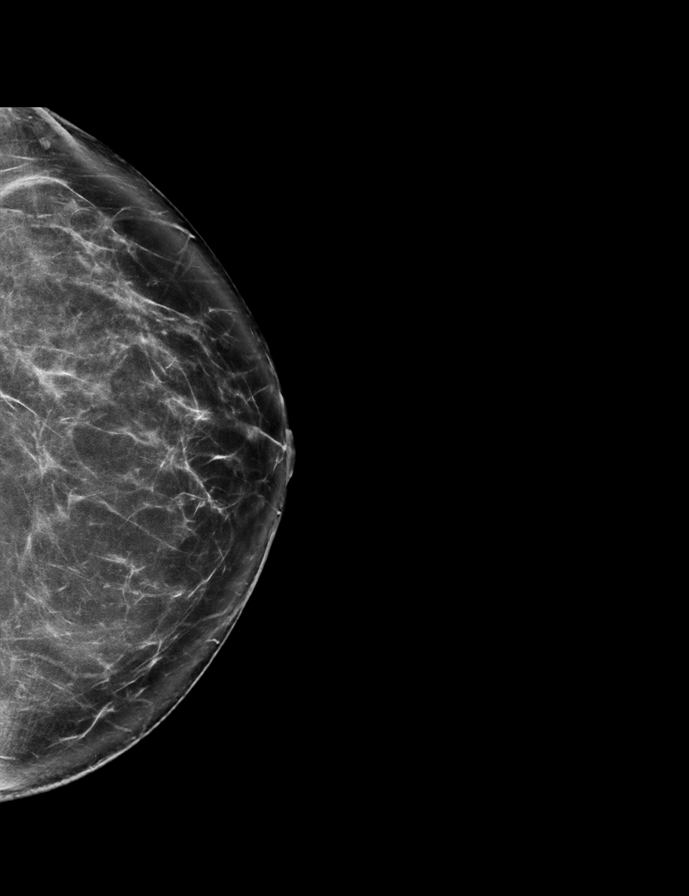

[L MLO synth-2D]
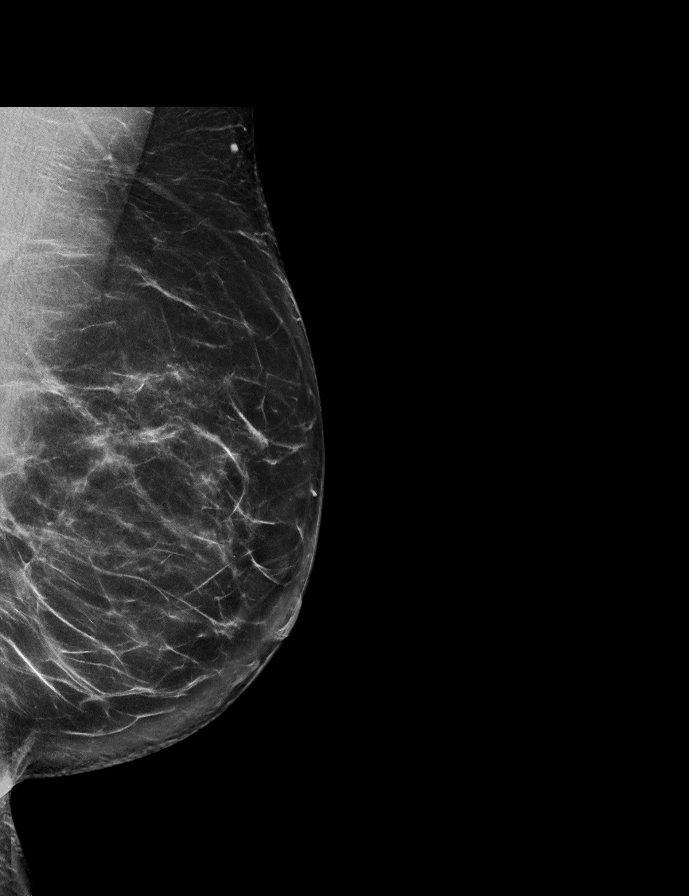

[R MLO synth-2D]
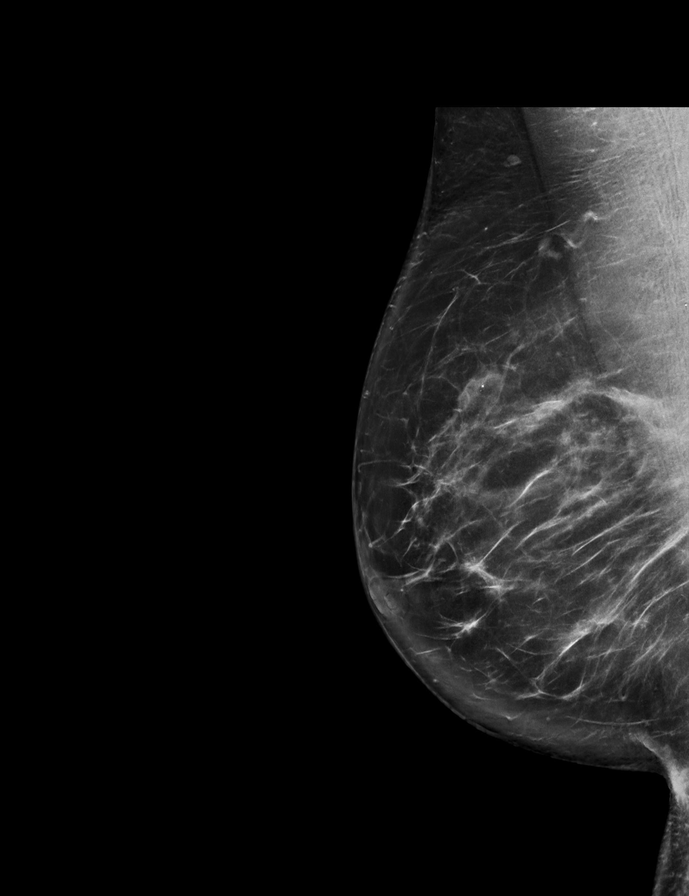

[R CC tomo · 2 of 81 frames shown]
[frame 27/81]
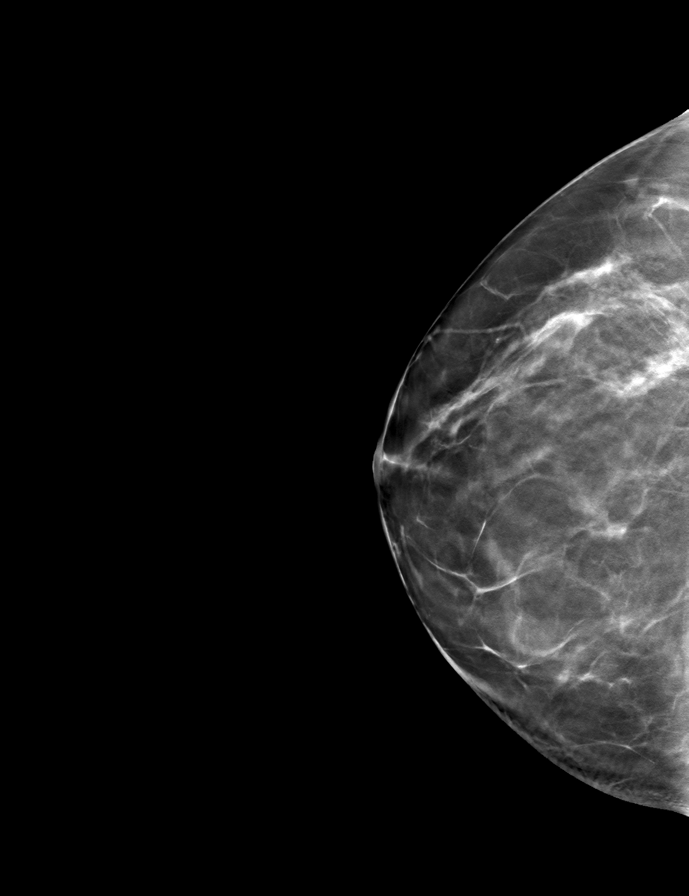
[frame 41/81]
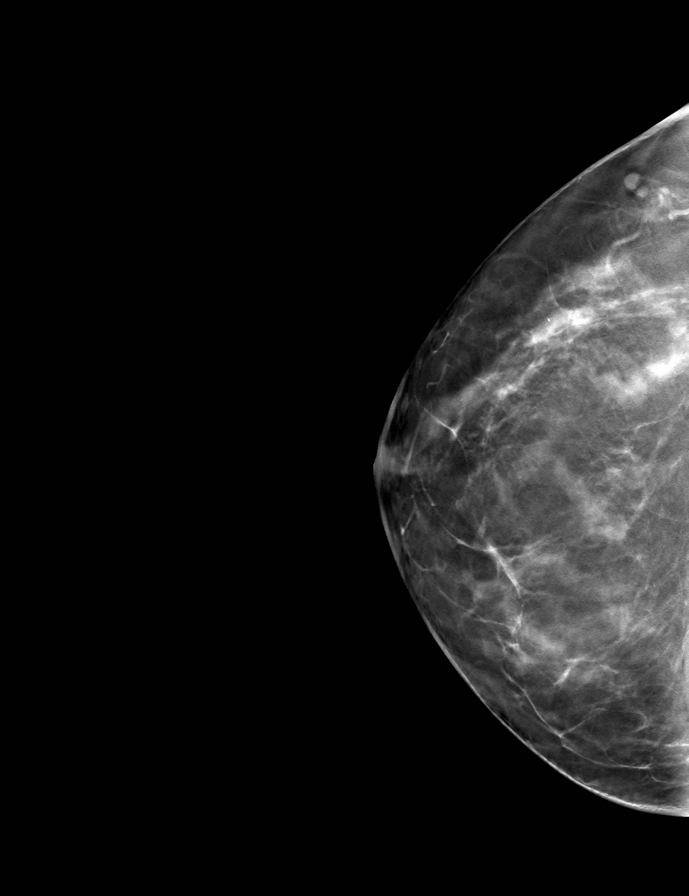

[L MLO tomo · tomo slice 43/84.0]
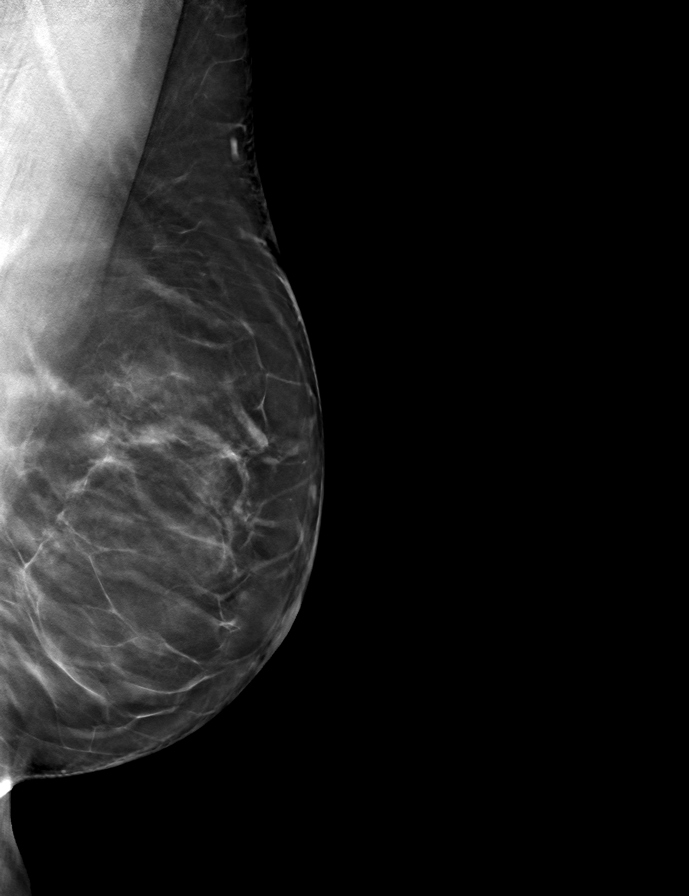

[L CC tomo · tomo slice 41/82.0]
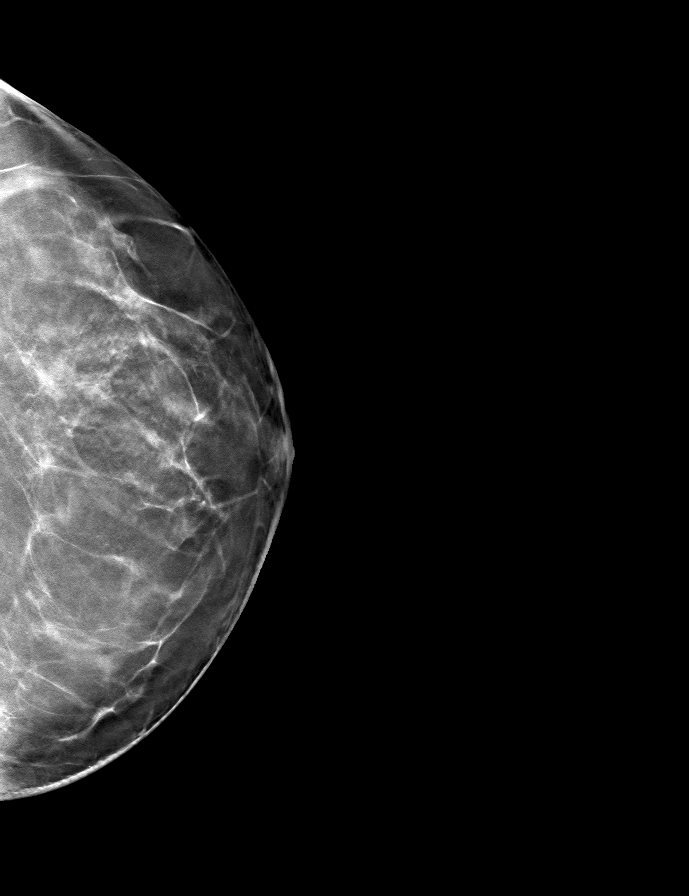

[R MLO tomo · tomo slice 44/87.0]
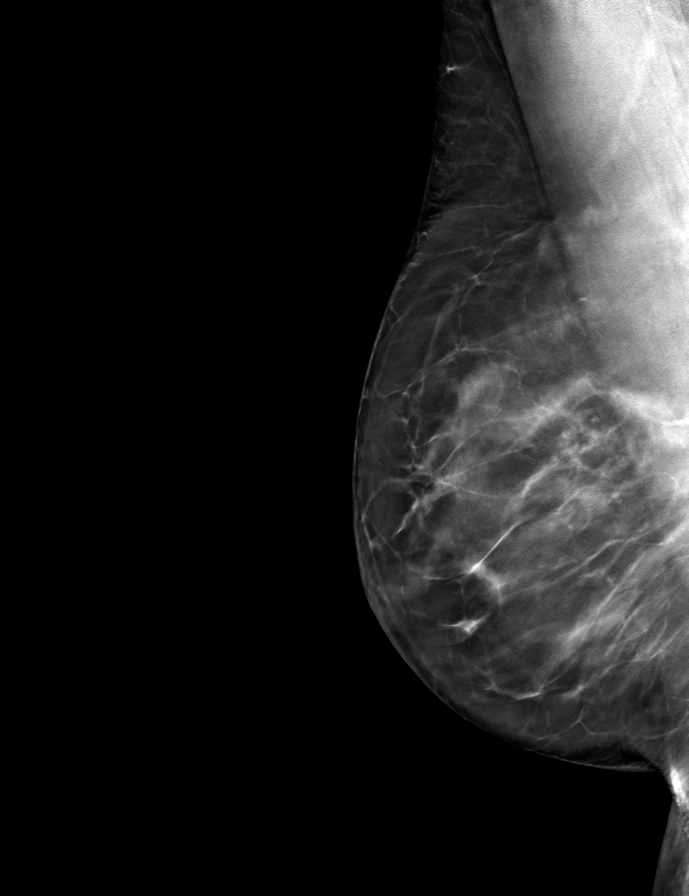

[9 of 24 positions shown; findings below may reference images not displayed]

ACR Breast Density Category b: There are scattered areas of
fibroglandular density.
FINDINGS: There are no findings suspicious for malignancy. Images were
processed with CAD.
IMPRESSION: No mammographic evidence of malignancy. A result letter of this
screening mammogram will be mailed directly to the patient.

RECOMMENDATION:
Screening mammogram in one year. (Code:[TQ])

BI-RADS CATEGORY  1: Negative.

## 2020-02-19 ENCOUNTER — Other Ambulatory Visit: Payer: Self-pay | Admitting: Family Medicine

## 2020-02-19 DIAGNOSIS — Z1231 Encounter for screening mammogram for malignant neoplasm of breast: Secondary | ICD-10-CM

## 2020-04-03 ENCOUNTER — Other Ambulatory Visit: Payer: Self-pay

## 2020-04-03 ENCOUNTER — Ambulatory Visit
Admission: RE | Admit: 2020-04-03 | Discharge: 2020-04-03 | Disposition: A | Discharge status: Home / Self Care | Payer: BC Managed Care – PPO | Source: Ambulatory Visit | Attending: Family Medicine | Admitting: Family Medicine

## 2020-04-03 DIAGNOSIS — Z1231 Encounter for screening mammogram for malignant neoplasm of breast: Secondary | ICD-10-CM

## 2020-04-03 IMAGING — MG MM DIGITAL SCREENING BILAT W/ TOMO AND CAD
8 series · 9 of 24 positions shown · non-contrast
Comparison: Previous exam(s).

CLINICAL DATA: Screening.

EXAM:
DIGITAL SCREENING BILATERAL MAMMOGRAM WITH TOMOSYNTHESIS AND CAD
TECHNIQUE: Bilateral screening digital craniocaudal and mediolateral oblique
mammograms were obtained. Bilateral screening digital breast
tomosynthesis was performed. The images were evaluated with
computer-aided detection.

[R MLO synth-2D]
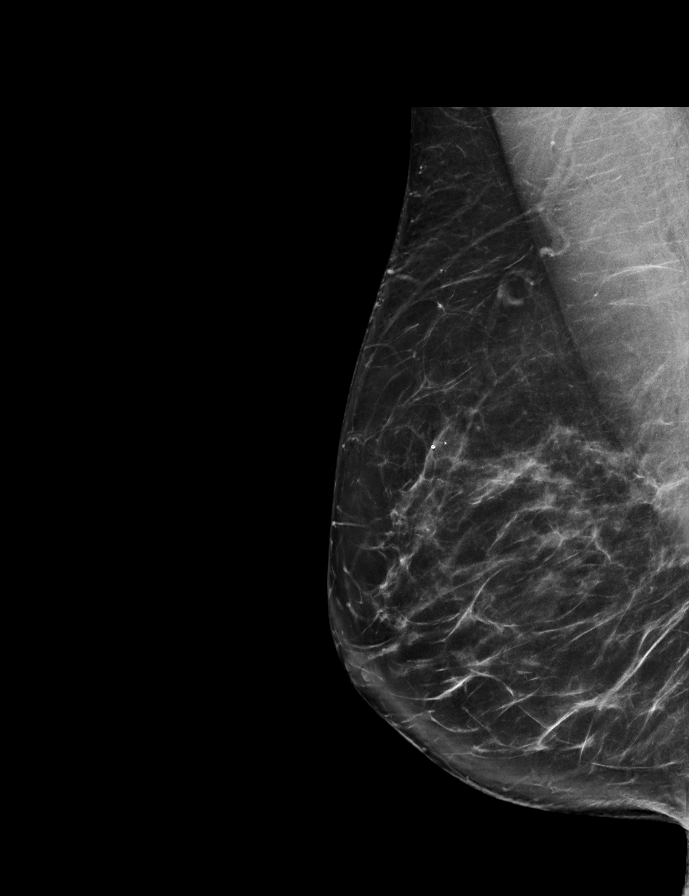

[L CC synth-2D]
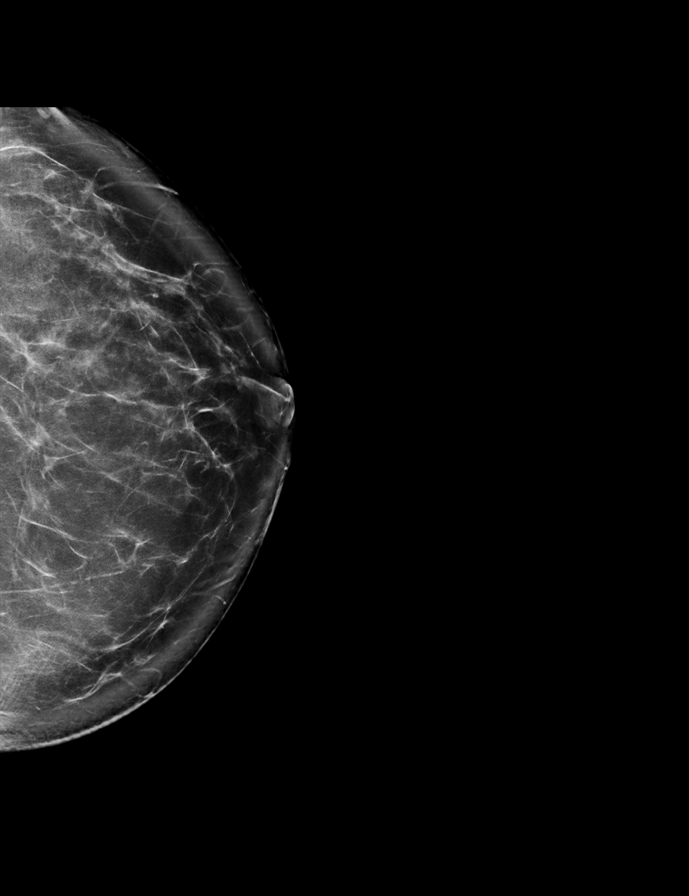

[L MLO synth-2D]
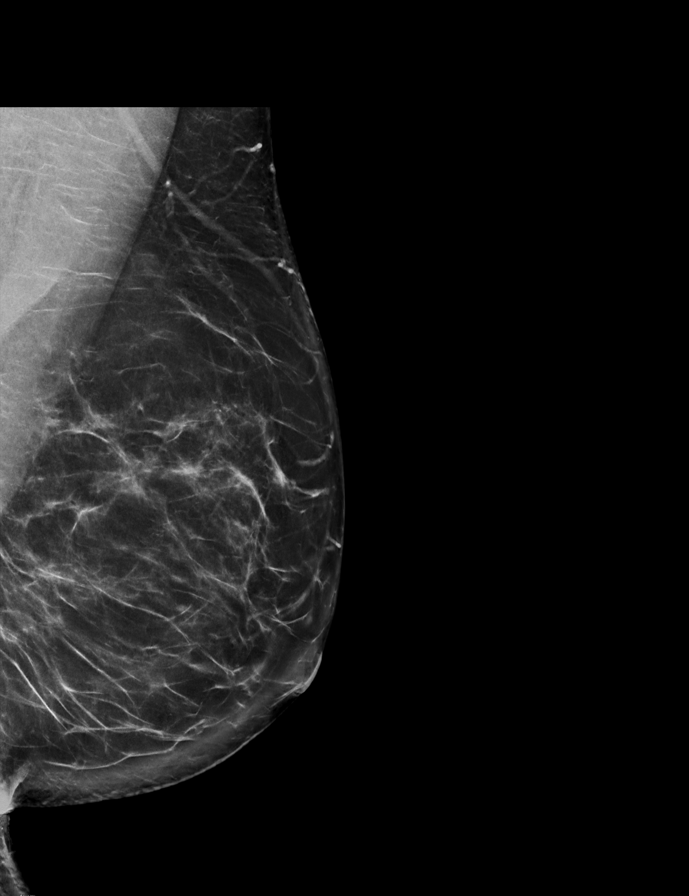

[R CC synth-2D]
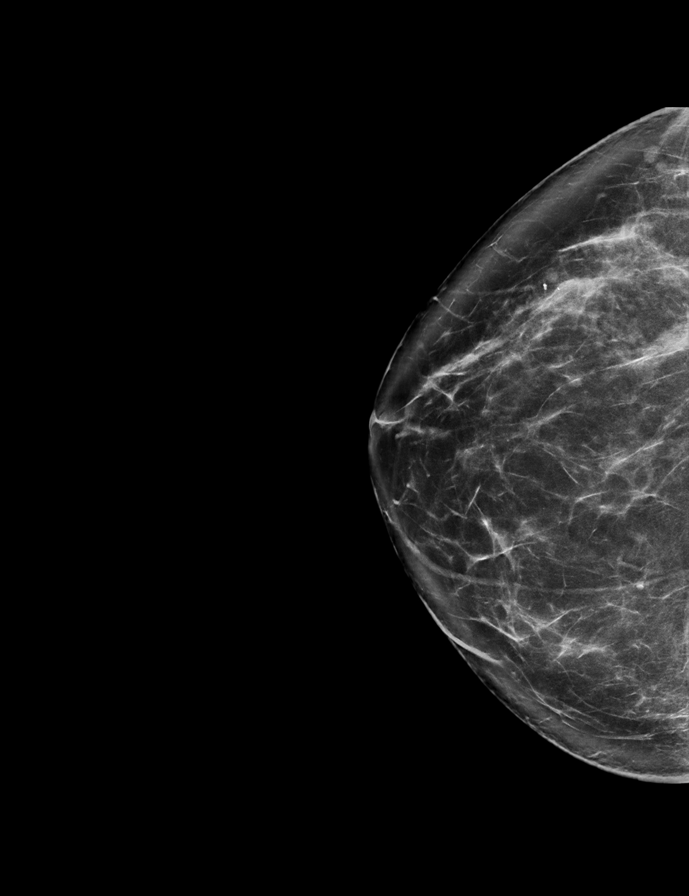

[L MLO tomo · 2 of 87 frames shown]
[frame 29/87]
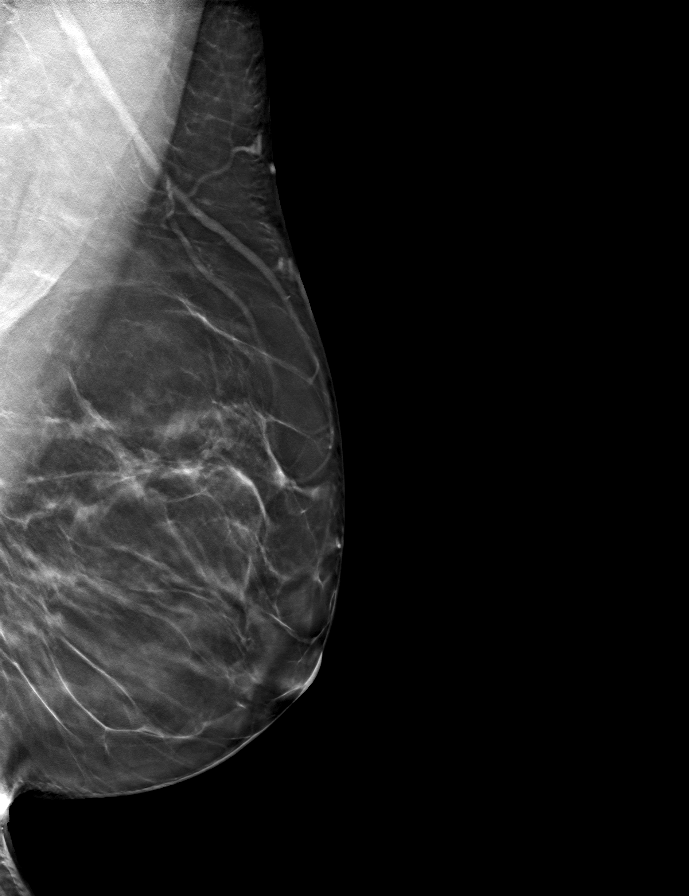
[frame 44/87]
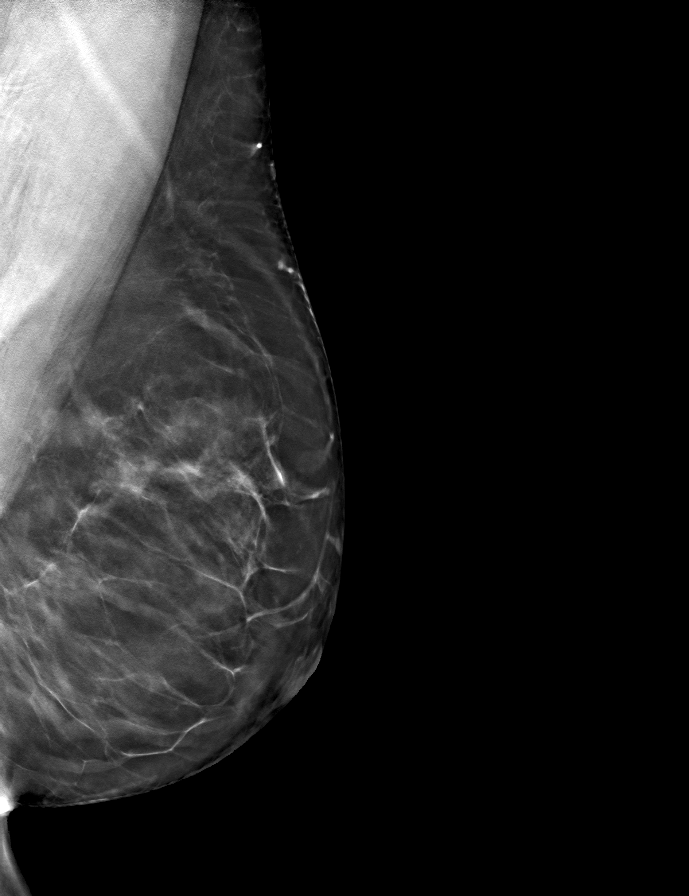

[L CC tomo · tomo slice 45/90.0]
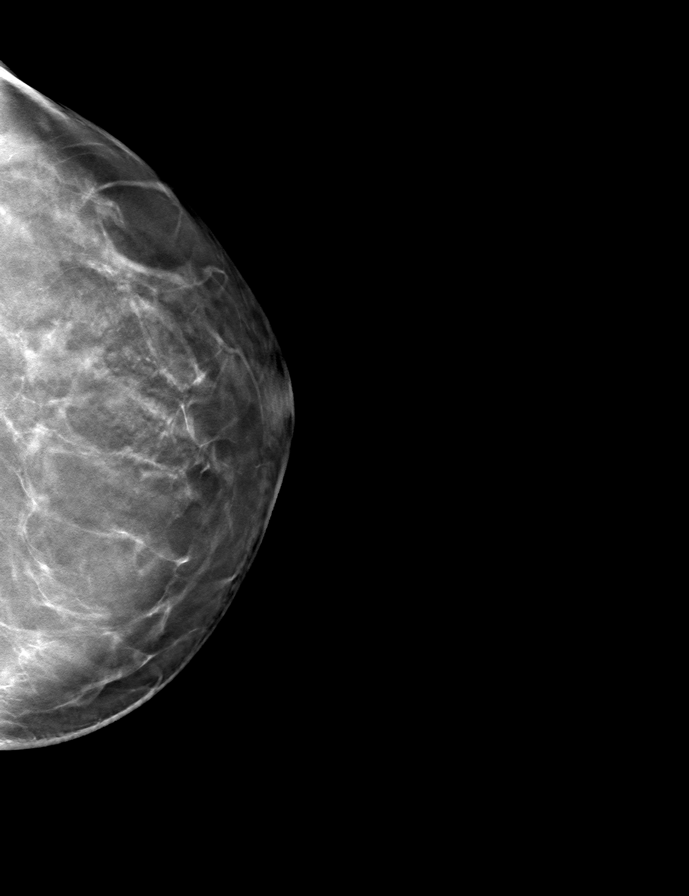

[R MLO tomo · tomo slice 41/82.0]
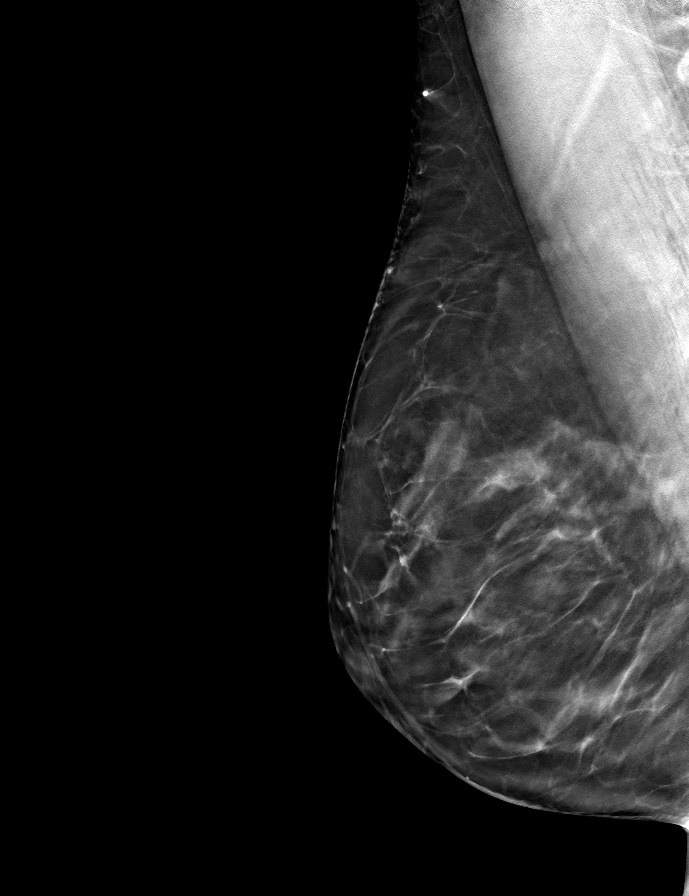

[R CC tomo · tomo slice 41/81.0]
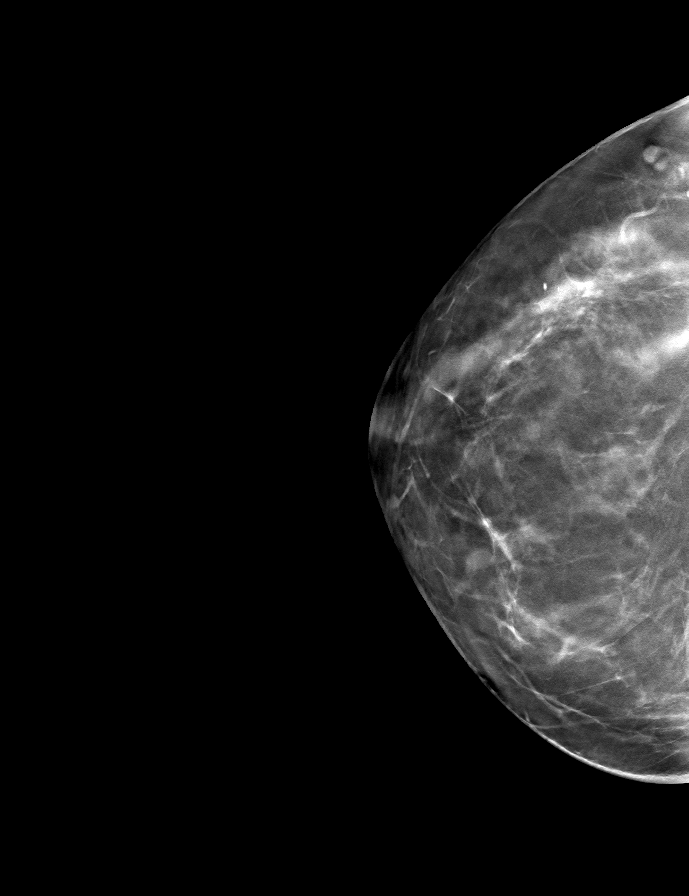

[9 of 24 positions shown; findings below may reference images not displayed]

ACR Breast Density Category c: The breast tissue is heterogeneously
dense, which may obscure small masses.
FINDINGS: There are no findings suspicious for malignancy.
IMPRESSION: No mammographic evidence of malignancy. A result letter of this
screening mammogram will be mailed directly to the patient.

RECOMMENDATION:
Screening mammogram in one year. (Code:[V2])

BI-RADS CATEGORY  1: Negative.

## 2020-09-25 ENCOUNTER — Other Ambulatory Visit: Payer: Self-pay | Admitting: Obstetrics and Gynecology

## 2020-09-25 DIAGNOSIS — Z803 Family history of malignant neoplasm of breast: Secondary | ICD-10-CM

## 2020-10-18 ENCOUNTER — Other Ambulatory Visit: Payer: BC Managed Care – PPO

## 2020-10-21 ENCOUNTER — Ambulatory Visit
Admission: RE | Admit: 2020-10-21 | Discharge: 2020-10-21 | Disposition: A | Discharge status: Home / Self Care | Payer: BC Managed Care – PPO | Source: Ambulatory Visit | Attending: Obstetrics and Gynecology | Admitting: Obstetrics and Gynecology

## 2020-10-21 DIAGNOSIS — Z803 Family history of malignant neoplasm of breast: Secondary | ICD-10-CM

## 2020-10-21 IMAGING — MR MR BREAST BILAT WO/W CM
8 of 12 series · 32 of 48 positions shown · IV contrast (7ml gadavist)
Comparison: No previous breast MRI.

CLINICAL DATA: Family history of breast cancer.

LABS:  Not performed at imaging site.
EXAM:
BILATERAL BREAST MRI WITH AND WITHOUT CONTRAST
TECHNIQUE: Multiplanar, multisequence MR images of both breasts were obtained
prior to and following the intravenous administration of 7 ml of
Gadavist

[Series 2: t2_tirm_tra ipat (a-p) · axial · 3.0mm · 0.70mm/px · 1 of 55 slices shown]
[im 1/55]
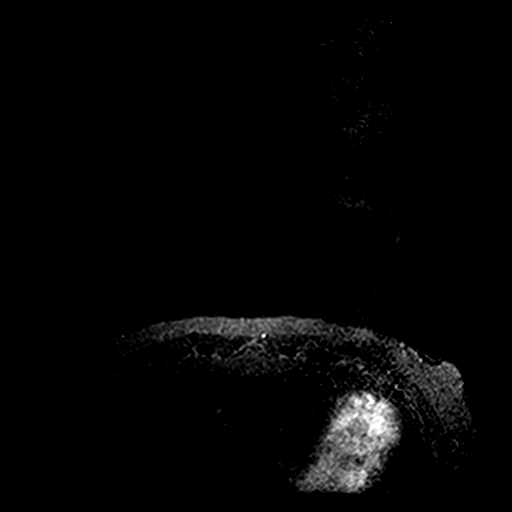

[Series 3: fl3d pre-cm no · axial · non-contrast · 1.2mm · 0.89mm/px · z∈[-63,+109]mm · 5 of 144 slices shown]
[im 1/144]
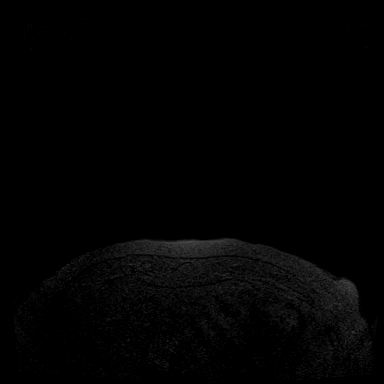
[im 36/144]
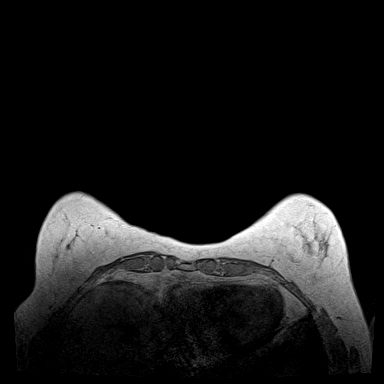
[im 72/144]
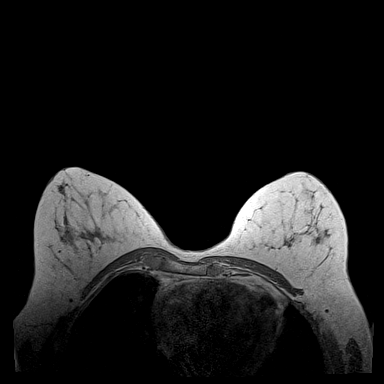
[im 108/144]
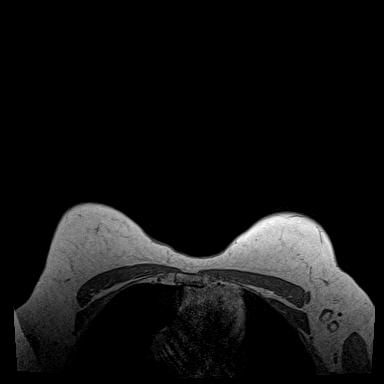
[im 144/144]
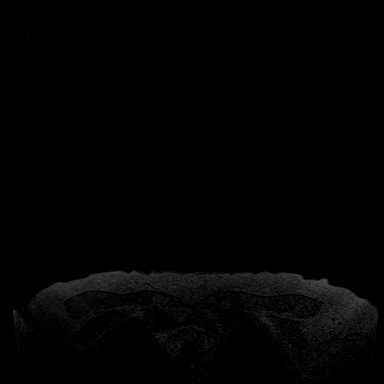

[Series 4: fl3d pre-cm · axial · non-contrast · 1.2mm · 0.89mm/px · z∈[-63,+109]mm · 5 of 144 slices shown]
[im 1/144]
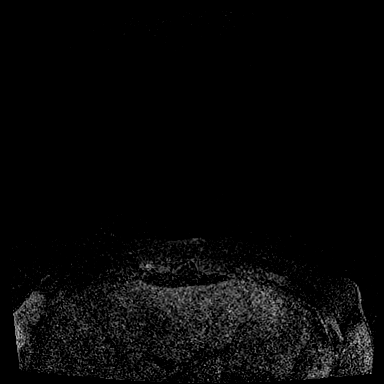
[im 36/144]
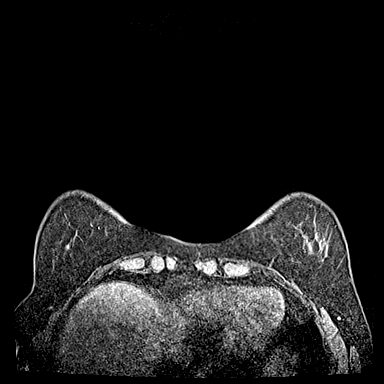
[im 72/144]
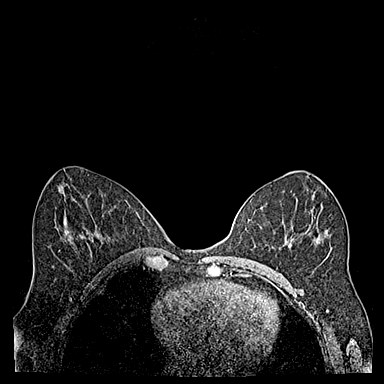
[im 108/144]
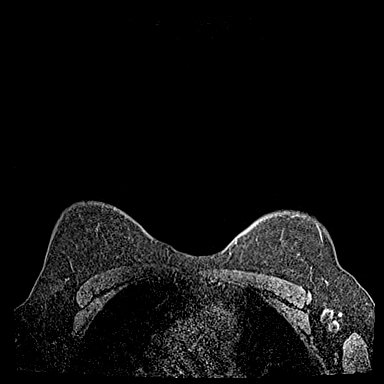
[im 144/144]
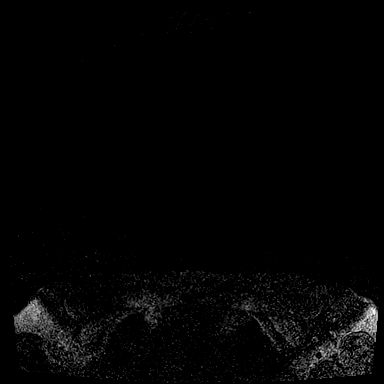

[Series 5: fl3d post-cm 20 · axial · 1.2mm · 0.89mm/px · z∈[-63,+109]mm · 5 of 144 slices shown (1 of 3)]
[im 1/144]
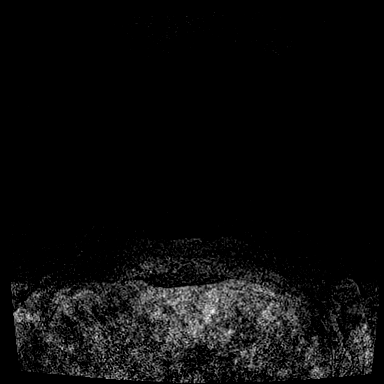
[im 36/144]
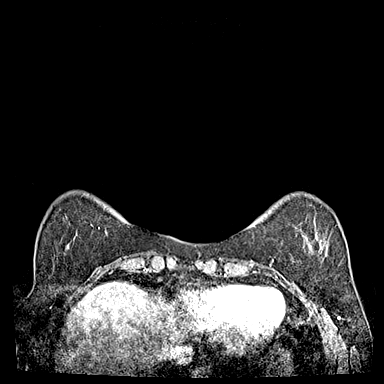
[im 72/144]
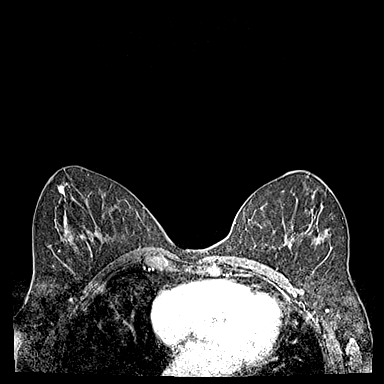
[im 108/144]
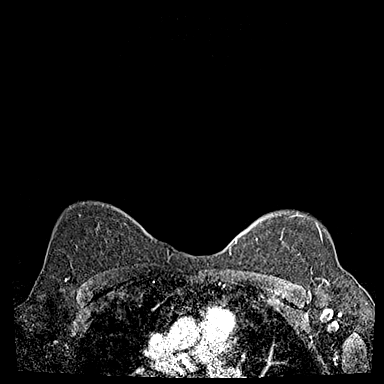
[im 144/144]
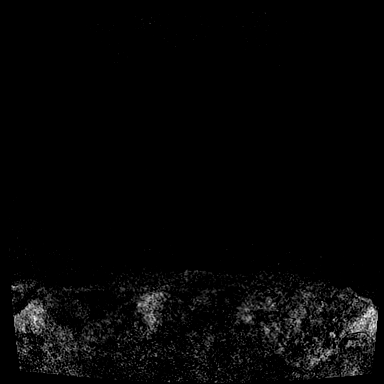

[Series 6: fl3d post-cm 20 · axial · 1.2mm · 0.89mm/px · z∈[-63,+109]mm · 5 of 144 slices shown (2 of 3)]
[im 1/144]
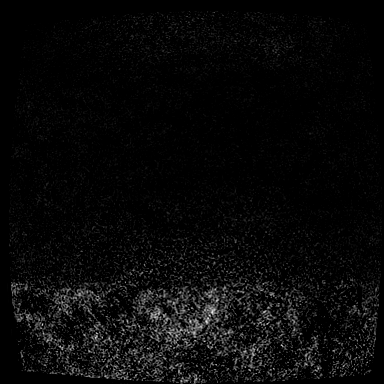
[im 36/144]
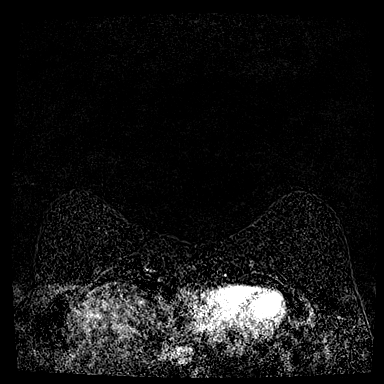
[im 72/144]
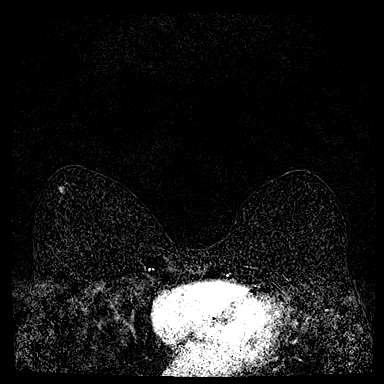
[im 108/144]
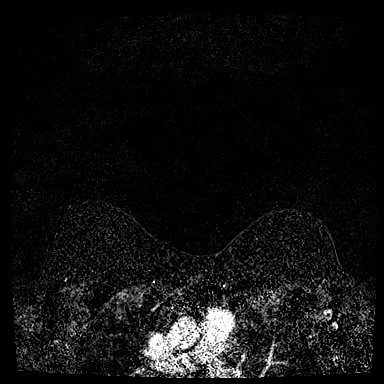
[im 144/144]
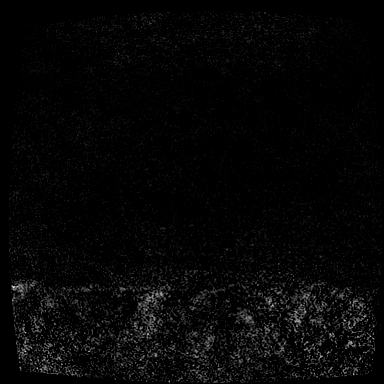

[Series 7: fl3d post-cm 20 · axial · 172.8mm · 0.89mm/px · 1 of 1 slices shown (3 of 3)]
[im 1/1]
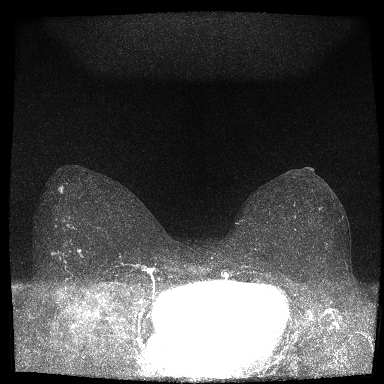

[Series 8: fl3d post-cm 3 · axial · 1.2mm · 0.89mm/px · z∈[-63,+109]mm · 6 of 144 slices shown (1 of 2)]
[im 1/144]
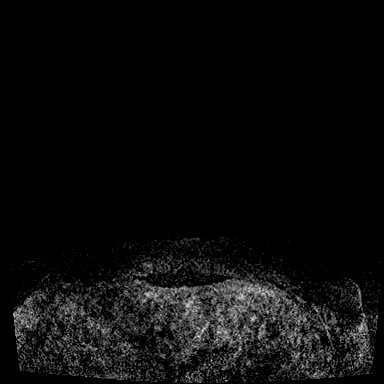
[im 29/144]
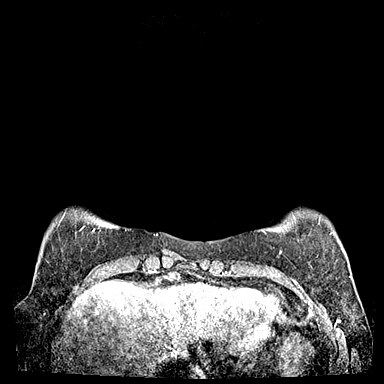
[im 58/144]
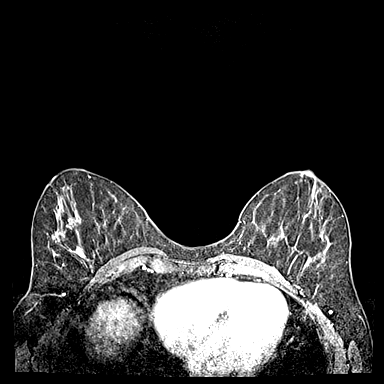
[im 86/144]
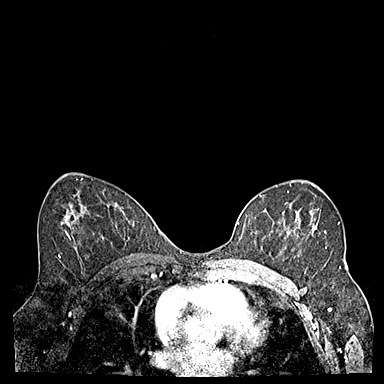
[im 115/144]
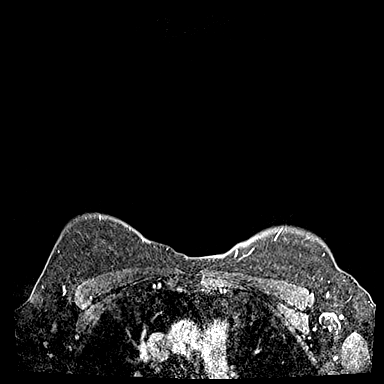
[im 144/144]
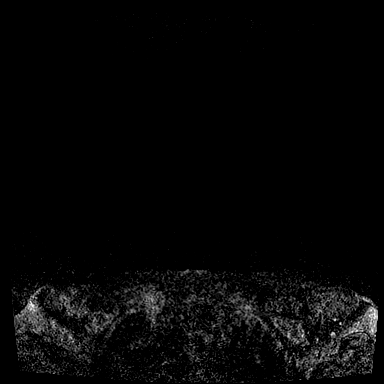

[Series 9: fl3d post-cm 3 · axial · 1.2mm · 0.89mm/px · z∈[-63,+39]mm · 4 of 144 slices shown (2 of 2)]
[im 1/144]
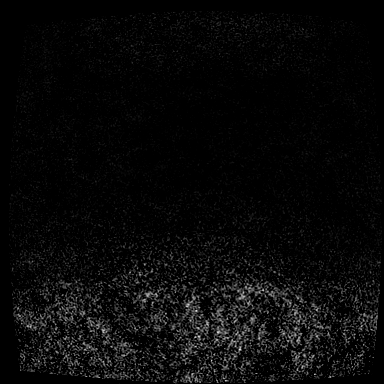
[im 29/144]
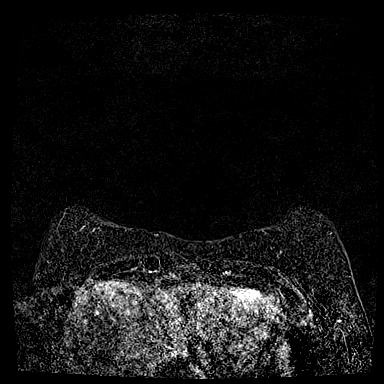
[im 58/144]
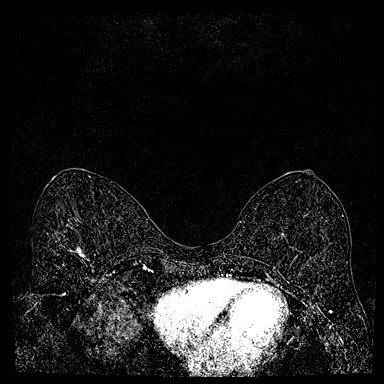
[im 86/144]
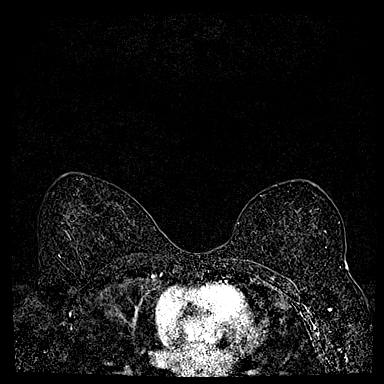

[32 of 48 positions shown; findings below may reference images not displayed]

Three-dimensional MR images were rendered by post-processing of the
original MR data on an independent workstation. The
three-dimensional MR images were interpreted, and findings are
reported in the following complete MRI report for this study. Three
dimensional images were evaluated at the independent interpreting
workstation using the DynaCAD thin client.
FINDINGS: Breast composition: b. Scattered fibroglandular tissue.

Background parenchymal enhancement: Mild

Right breast: Irregular enhancing mass within the upper-outer
quadrant of the RIGHT breast, at anterior depth, measuring 8 mm,
with progressive enhancement kinetics (series 9, image 74).

Additional scattered benign-appearing enhancing foci within the
lower RIGHT breast, at middle and posterior depth.

Left breast: Scattered benign-appearing enhancing foci with central
and outer LEFT breast.

No suspicious enhancing mass, suspicious non-mass or secondary signs
of malignancy within the LEFT breast.

Lymph nodes: No abnormal appearing lymph nodes.

Ancillary findings:  None.
IMPRESSION: 1. Irregular enhancing mass within the upper-outer quadrant of the
RIGHT breast, at anterior depth, measuring 8 mm (series 9, image
74). This is a suspicious finding for which MRI-guided biopsy is
recommended.
2. Additional scattered benign-appearing enhancing foci within each
breast.

RECOMMENDATION:
1. MRI-guided biopsy of the irregular enhancing mass within the
upper-outer quadrant of the RIGHT breast, at anterior depth,
measuring 8 mm.
2. If the MRI-guided biopsy provides a benign and concordant
pathology result, recommend six-month follow-up breast MRI per
protocol with attention to the additional scattered benign-appearing
foci within each breast to ensure stability.
3. If the MRI-guided biopsy provides a neoplastic result or a high
risk breast lesion requiring surgical excision, would then recommend
additional MRI-guided biopsies for the dominant enhancing foci
within the posterior RIGHT breast and outer LEFT breast.

BI-RADS CATEGORY  4: Suspicious.

## 2020-10-21 MED ORDER — GADOBUTROL 1 MMOL/ML IV SOLN
7.0000 mL | Freq: Once | INTRAVENOUS | Status: AC | PRN
  Administered 2020-10-21: 7 mL via INTRAVENOUS

## 2020-10-22 ENCOUNTER — Other Ambulatory Visit: Payer: Self-pay | Admitting: Obstetrics and Gynecology

## 2020-10-22 DIAGNOSIS — R9389 Abnormal findings on diagnostic imaging of other specified body structures: Secondary | ICD-10-CM

## 2020-11-03 ENCOUNTER — Other Ambulatory Visit: Payer: Self-pay | Admitting: Diagnostic Radiology

## 2020-11-03 ENCOUNTER — Ambulatory Visit
Admission: RE | Admit: 2020-11-03 | Discharge: 2020-11-03 | Disposition: A | Discharge status: Home / Self Care | Payer: BC Managed Care – PPO | Source: Ambulatory Visit | Attending: Obstetrics and Gynecology | Admitting: Obstetrics and Gynecology

## 2020-11-03 ENCOUNTER — Other Ambulatory Visit: Payer: Self-pay

## 2020-11-03 DIAGNOSIS — R9389 Abnormal findings on diagnostic imaging of other specified body structures: Secondary | ICD-10-CM

## 2020-11-03 HISTORY — PX: BREAST BIOPSY: SHX20

## 2020-11-03 IMAGING — MG MM BREAST LOCALIZATION CLIP
4 series · 4 of 12 positions shown · non-contrast
Comparison: Previous exam(s).

CLINICAL DATA: Status post MRI guided biopsy of the right breast.

EXAM:
3D DIAGNOSTIC RIGHT MAMMOGRAM POST MRI BIOPSY

[R ML synth-2D]
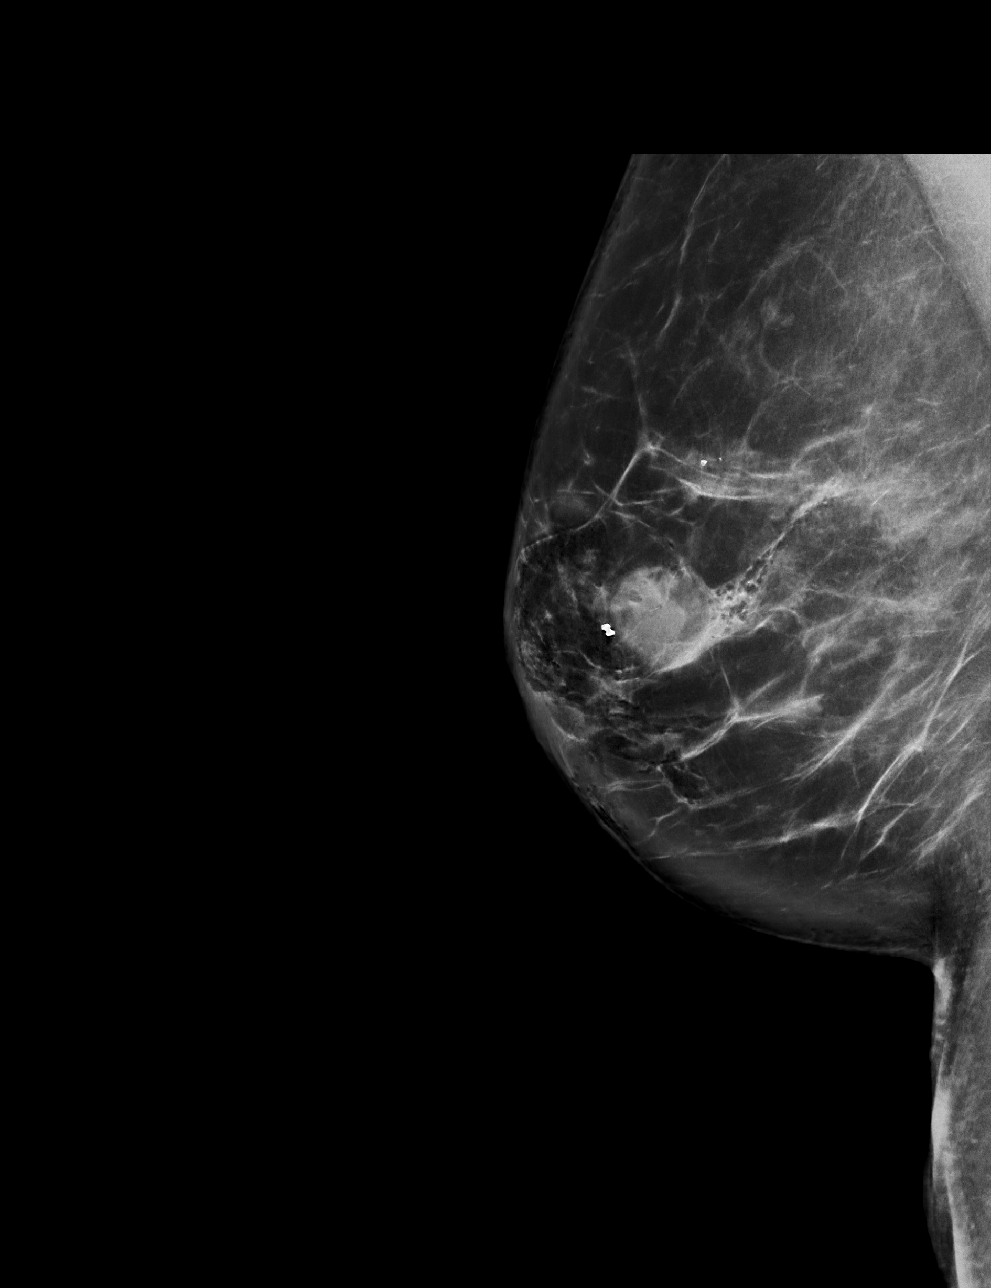

[R CC synth-2D]
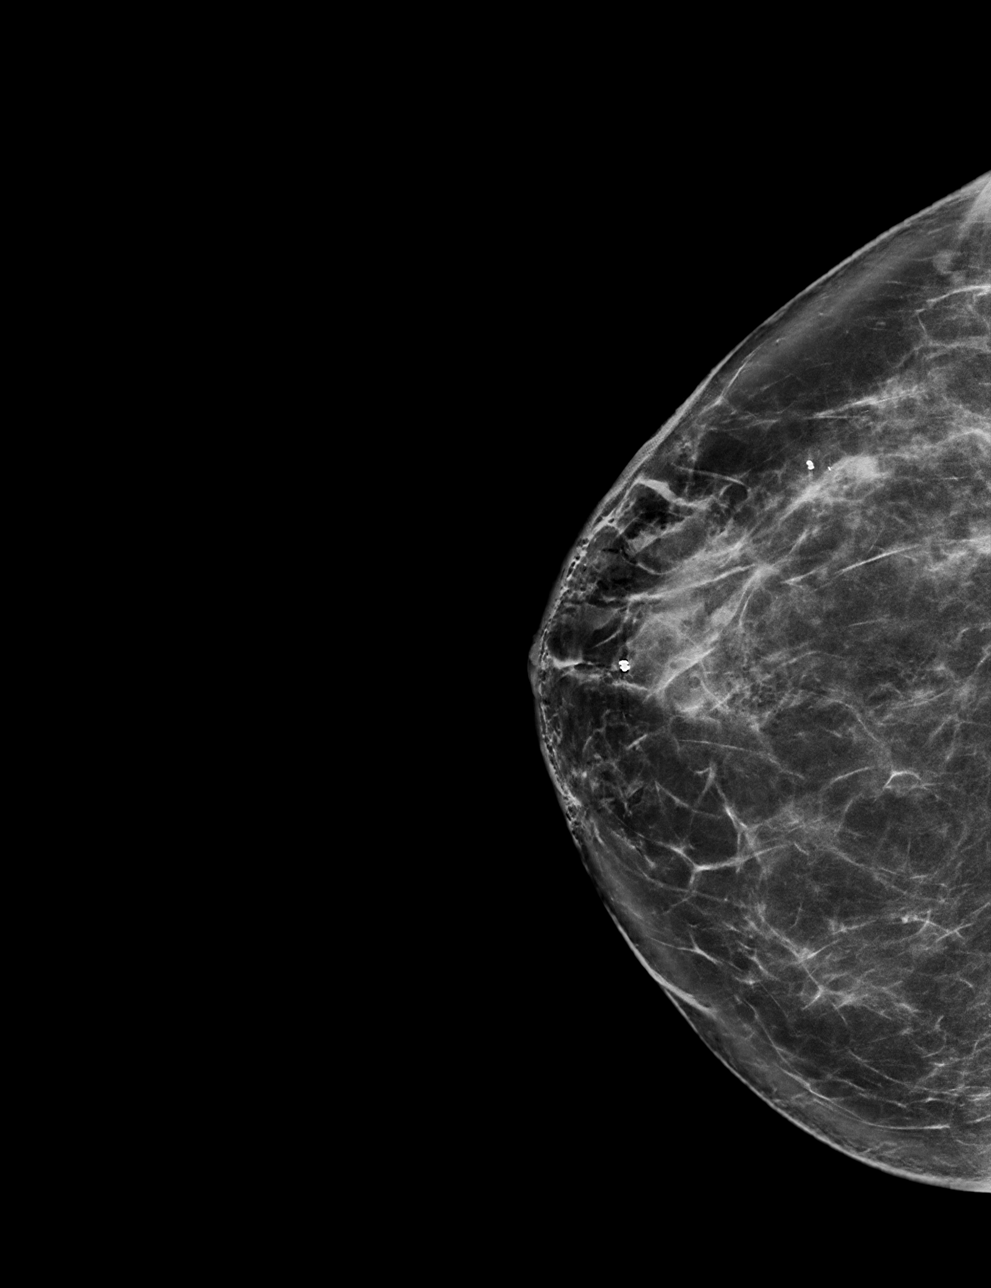

[R CC tomo · tomo slice 40/79.0]
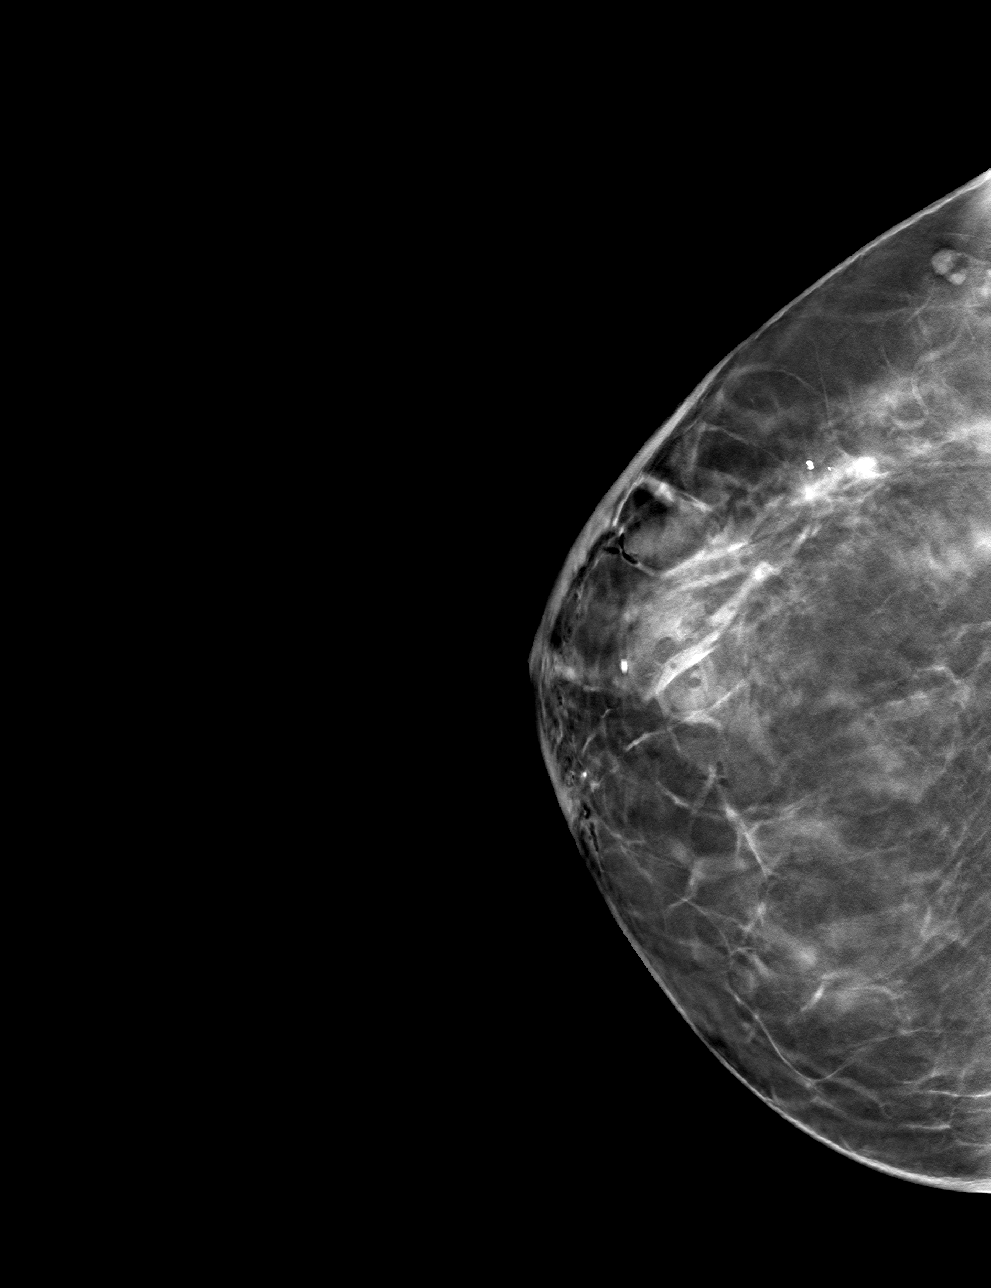

[R ML tomo · tomo slice 49/96.0]
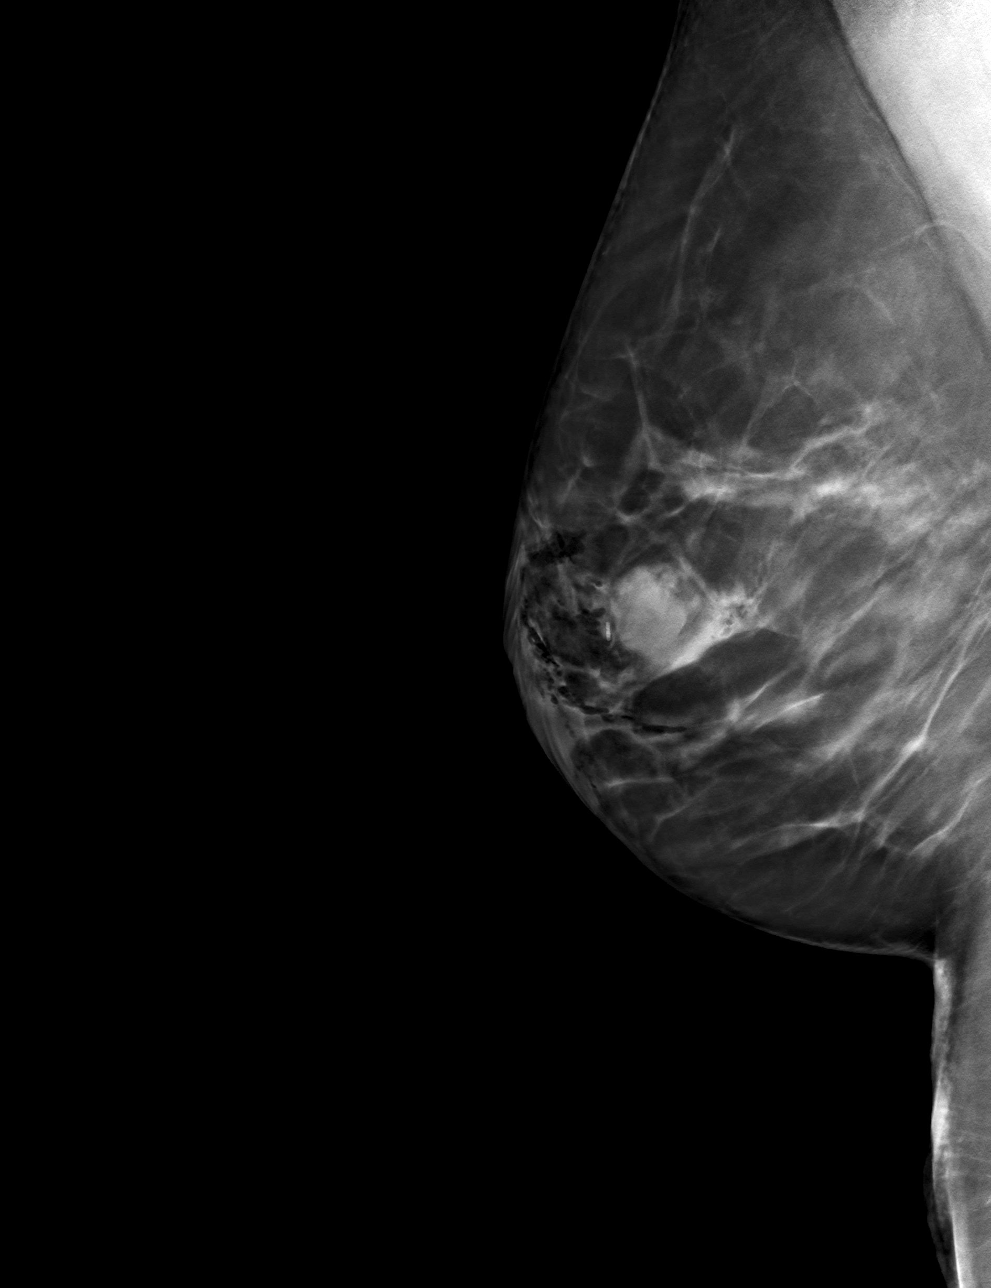

[4 of 12 positions shown; findings below may reference images not displayed]

FINDINGS: 3D Mammographic images were obtained following MRI guided biopsy of
the right breast. The biopsy marking clip is in expected position at
the site of biopsy. Note is made of an associated 1.8 cm post biopsy
hematoma.
IMPRESSION: Appropriate positioning of the barbell shaped biopsy marking clip at
the site of biopsy in the anterior right breast. Note is made of a
1.8 cm post biopsy hematoma.

Final Assessment: Post Procedure Mammograms for Marker Placement

## 2020-11-03 IMAGING — MR MR BREAST BX W/ LOC DEV 1ST LEASION IMAGE BX SPEC MR GUIDE*R*
8 of 11 series · 33 of 48 positions shown · IV contrast (7ml gadavist)
Comparison: Previous exams.
COMPARISON: Previous exams.

Addendum:
CLINICAL DATA: 65-year-old female with an irregular, enhancing
right breast.

EXAM:
MRI GUIDED CORE NEEDLE BIOPSY OF THE RIGHT BREAST
TECHNIQUE: Multiplanar, multisequence MR imaging of the right breast was
performed both before and after administration of intravenous
contrast.
CONTRAST:  7mL GADAVIST GADOBUTROL 1 MMOL/ML IV SOLN

[Series 2: fiducial unilateral · sagittal · 2.0mm · 1.33mm/px · 1 of 52 slices shown (1 of 2)]
[im 1/52]
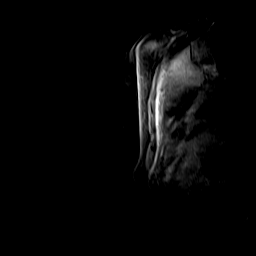

[Series 5: fiducial unilateral · sagittal · 2.0mm · 1.33mm/px · 2 of 52 slices shown (2 of 2)]
[im 1/52]
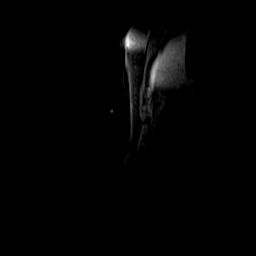
[im 52/52]
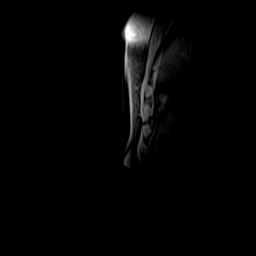

[Series 6: dynamic pre · axial · non-contrast · 1.3mm · 0.73mm/px · z∈[-92,+93]mm · 5 of 144 slices shown]
[im 1/144]
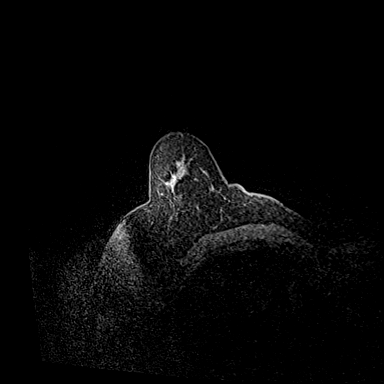
[im 36/144]
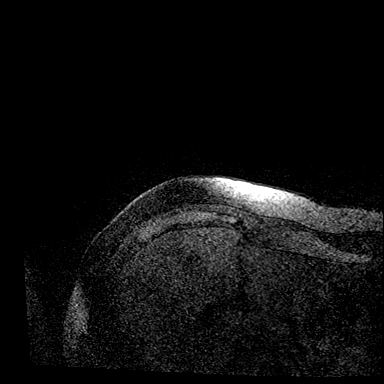
[im 72/144]
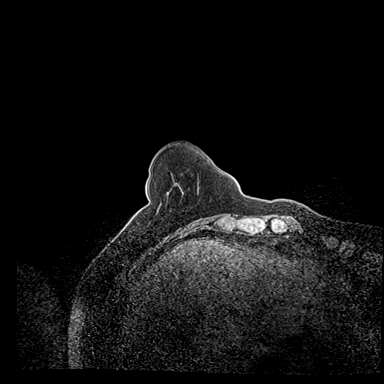
[im 108/144]
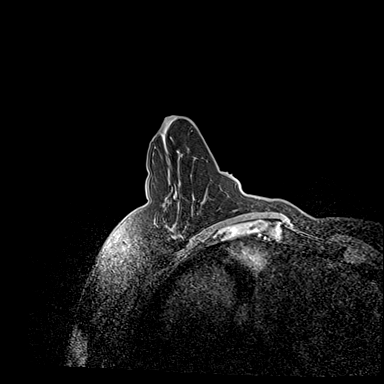
[im 144/144]
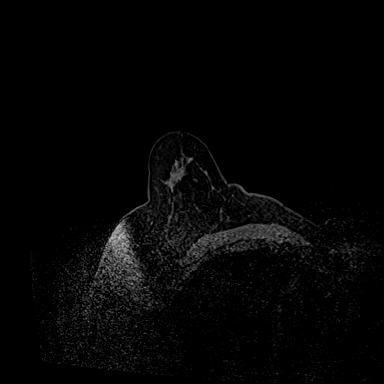

[Series 7: dynamic post 20 · axial · 1.3mm · 0.73mm/px · z∈[-92,+93]mm · 5 of 144 slices shown (1 of 2)]
[im 1/144]
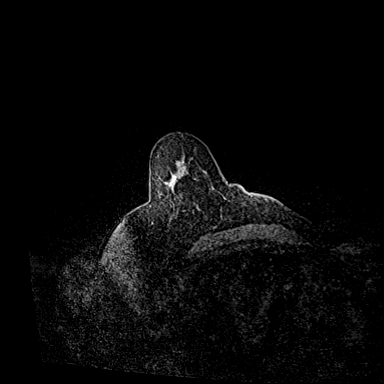
[im 36/144]
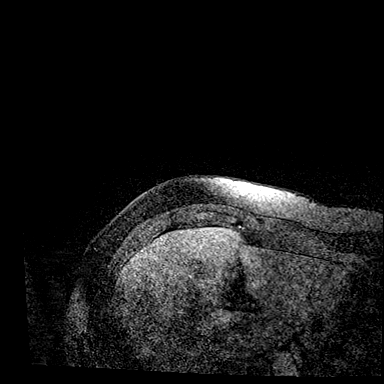
[im 72/144]
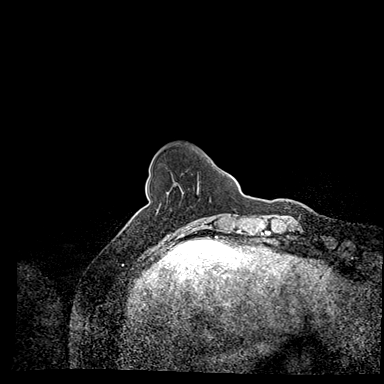
[im 108/144]
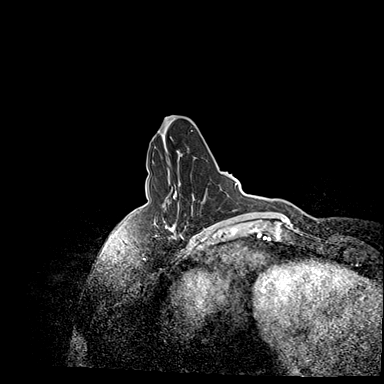
[im 144/144]
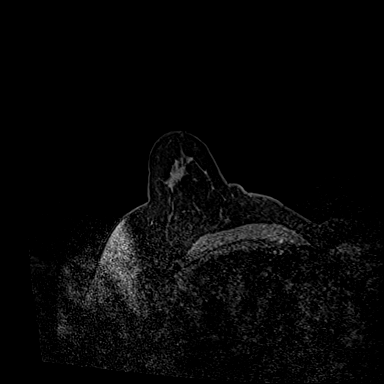

[Series 8: dynamic post 20 · axial · 1.3mm · 0.73mm/px · z∈[-92,+93]mm · 5 of 144 slices shown (2 of 2)]
[im 1/144]
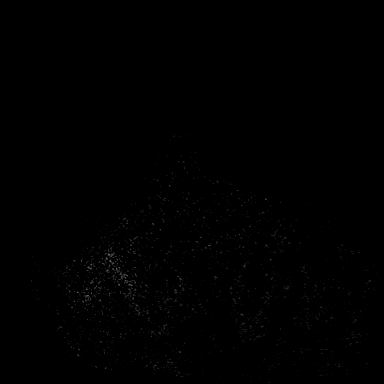
[im 36/144]
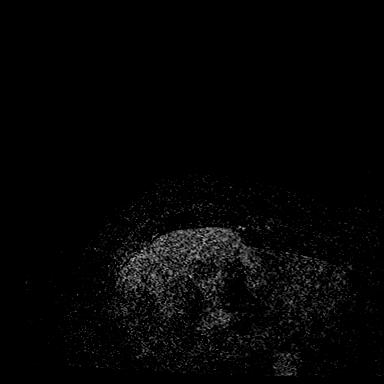
[im 72/144]
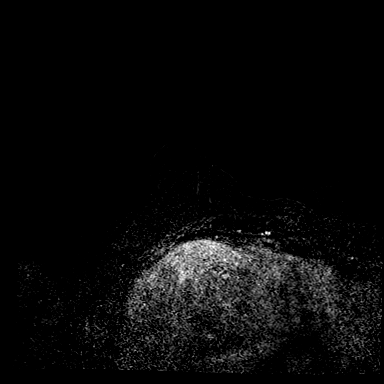
[im 108/144]
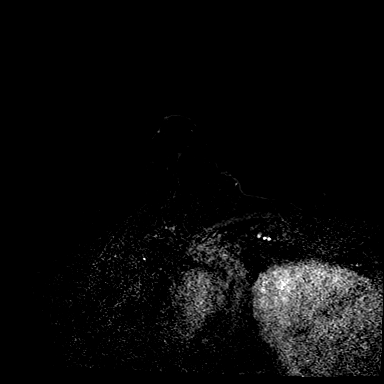
[im 144/144]
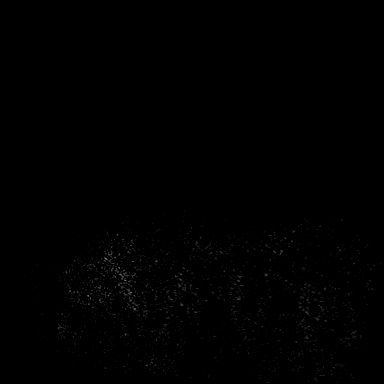

[Series 9: dynamic post 3 · axial · 1.3mm · 0.73mm/px · z∈[-92,+93]mm · 5 of 144 slices shown (1 of 2)]
[im 1/144]
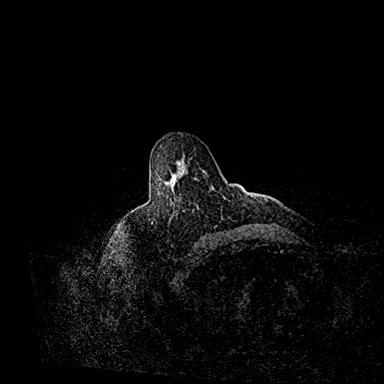
[im 36/144]
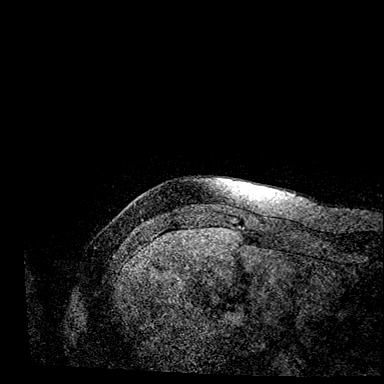
[im 72/144]
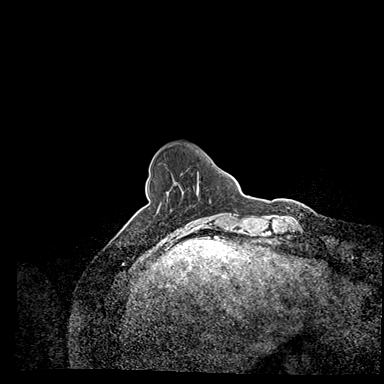
[im 108/144]
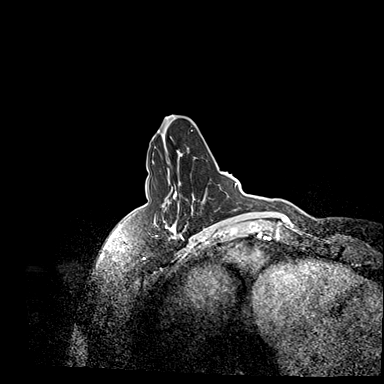
[im 144/144]
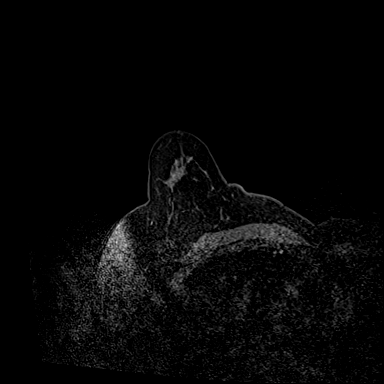

[Series 10: dynamic post 3 · axial · 1.3mm · 0.73mm/px · z∈[-92,+93]mm · 5 of 144 slices shown (2 of 2)]
[im 1/144]
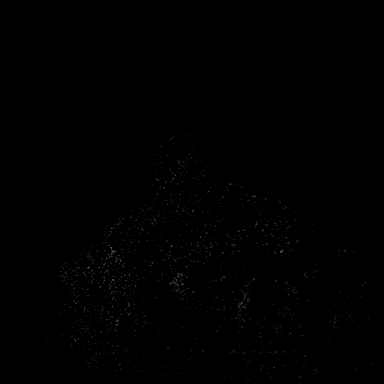
[im 36/144]
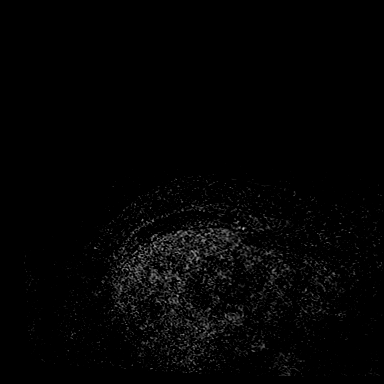
[im 72/144]
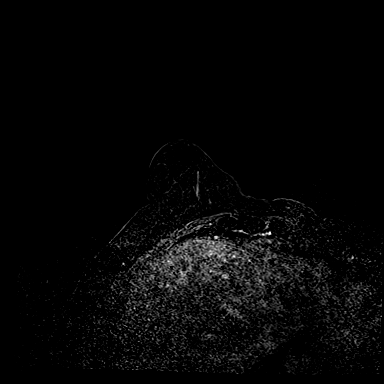
[im 108/144]
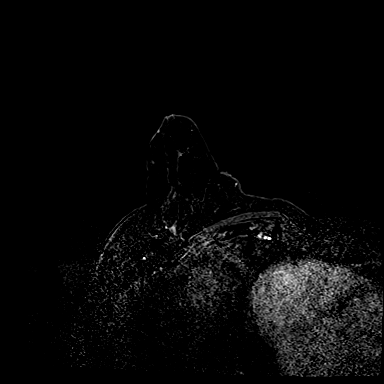
[im 144/144]
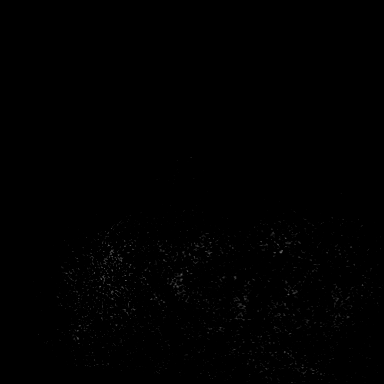

[Series 11: needle confirmation · axial · 1.3mm · 0.73mm/px · z∈[-92,+93]mm · 5 of 144 slices shown]
[im 1/144]
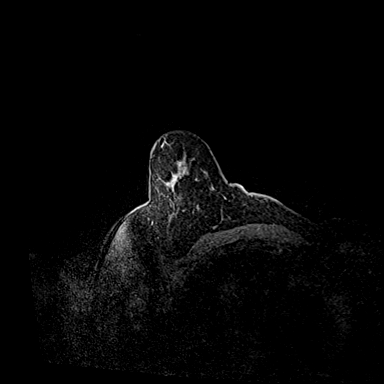
[im 36/144]
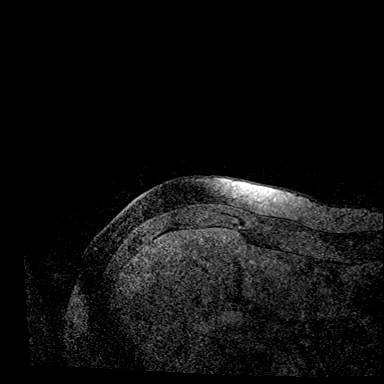
[im 72/144]
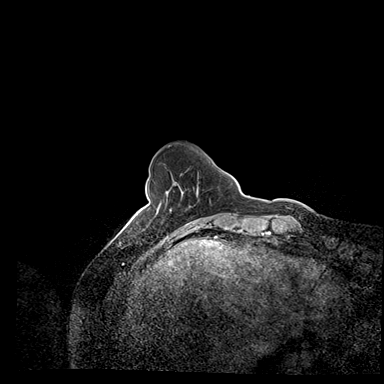
[im 108/144]
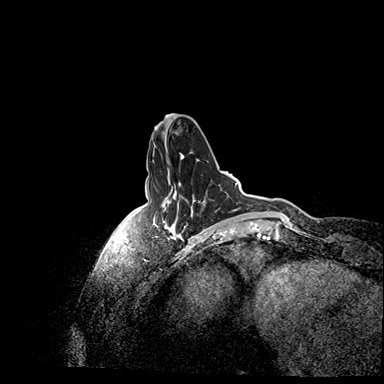
[im 144/144]
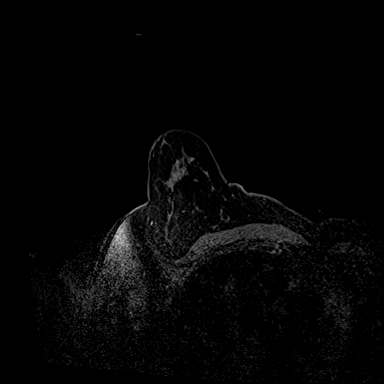

[33 of 48 positions shown; findings below may reference images not displayed]

FINDINGS: I met with the patient, and we discussed the procedure of MRI guided
biopsy, including risks, benefits, and alternatives. Specifically,
we discussed the risks of infection, bleeding, tissue injury, clip
migration, and inadequate sampling. Informed, written consent was
given. The usual time out protocol was performed immediately prior
to the procedure.

Using sterile technique, 1% Lidocaine, MRI guidance, and a 9 gauge
vacuum assisted device, biopsy was performed of an enhancing mass in
the upper-outer quadrant of the right breast using a lateral
approach. At the conclusion of the procedure, a barbell shaped
tissue marker clip was deployed into the biopsy cavity. Follow-up
2-view mammogram was performed and dictated separately.
IMPRESSION: MRI guided biopsy of the right breast.  No apparent complications.

ADDENDUM:
Pathology revealed COMPLEX SCLEROSING LESION WITH USUAL DUCTAL
HYPERPLASIA AND APOCRINE METAPLASIA, FIBROCYSTIC CHANGE WITH USUAL
DUCTAL HYPERPLASIA AND APOCRINE METAPLASIA- NEGATIVE FOR CARCINOMA
of the RIGHT breast, upper outer quadrant, anterior, barbell clip.
This was found to be concordant by Dr. YOEL, with surgical
consultation for excision recommended.

Pathology results were discussed with the patient by telephone. The
patient reported doing well after the biopsy with tenderness at the
site. Post biopsy instructions and care were reviewed and questions
were answered. The patient was encouraged to call The [REDACTED]

Patient is scheduled for additional LEFT and RIGHT MRI-guided
biopsies on [DATE] as recommended in diagnostic report.

Surgical consultation has been arranged with Dr. YOEL at
[REDACTED] on [DATE].

Pathology results reported by YOEL RN on [DATE].

*** End of Addendum ***
FINDINGS: I met with the patient, and we discussed the procedure of MRI guided
biopsy, including risks, benefits, and alternatives. Specifically,
we discussed the risks of infection, bleeding, tissue injury, clip
migration, and inadequate sampling. Informed, written consent was
given. The usual time out protocol was performed immediately prior
to the procedure.

Using sterile technique, 1% Lidocaine, MRI guidance, and a 9 gauge
vacuum assisted device, biopsy was performed of an enhancing mass in
the upper-outer quadrant of the right breast using a lateral
approach. At the conclusion of the procedure, a barbell shaped
tissue marker clip was deployed into the biopsy cavity. Follow-up
2-view mammogram was performed and dictated separately.
IMPRESSION: MRI guided biopsy of the right breast.  No apparent complications.

## 2020-11-03 MED ORDER — GADOBUTROL 1 MMOL/ML IV SOLN
7.0000 mL | Freq: Once | INTRAVENOUS | Status: AC | PRN
  Administered 2020-11-03: 7 mL via INTRAVENOUS

## 2020-11-04 ENCOUNTER — Other Ambulatory Visit: Payer: Self-pay | Admitting: Obstetrics and Gynecology

## 2020-11-04 DIAGNOSIS — N649 Disorder of breast, unspecified: Secondary | ICD-10-CM

## 2020-11-15 HISTORY — PX: BREAST BIOPSY: SHX20

## 2020-12-01 ENCOUNTER — Ambulatory Visit
Admission: RE | Admit: 2020-12-01 | Discharge: 2020-12-01 | Disposition: A | Discharge status: Home / Self Care | Payer: BC Managed Care – PPO | Source: Ambulatory Visit | Attending: Obstetrics and Gynecology | Admitting: Obstetrics and Gynecology

## 2020-12-01 ENCOUNTER — Other Ambulatory Visit (HOSPITAL_COMMUNITY): Payer: Self-pay | Admitting: Diagnostic Radiology

## 2020-12-01 ENCOUNTER — Other Ambulatory Visit: Payer: Self-pay

## 2020-12-01 DIAGNOSIS — N649 Disorder of breast, unspecified: Secondary | ICD-10-CM

## 2020-12-01 IMAGING — MR MR BREAST BX W/ LOC DEV 1ST LEASION IMAGE BX SPEC MR GUIDE*R*
8 of 12 series · 31 of 48 positions shown · IV contrast (gadavist)
Comparison: Previous exams.
COMPARISON: Previous exams.

Addendum:
CLINICAL DATA: Patient presents for MR guided core biopsy of area
of enhancement in the RIGHT breast and area of enhancement in the
LEFT breast.

EXAM:
MRI GUIDED CORE NEEDLE BIOPSY OF THE RIGHTAND LEFT BREAST
TECHNIQUE: Multiplanar, multisequence MR imaging of both breasts was performed
both before and after administration of intravenous contrast.
CONTRAST:  7mL GADAVIST GADOBUTROL 1 MMOL/ML IV SOLN

[Series 2: fiducial bilateral · sagittal · 2.0mm · 1.33mm/px · 4 of 160 slices shown]
[im 1/160]
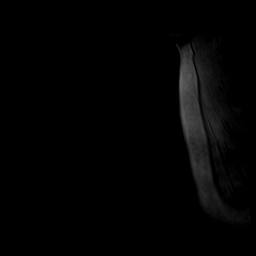
[im 54/160]
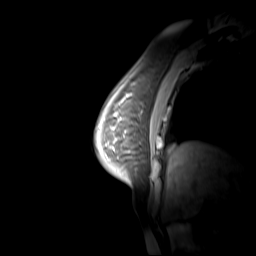
[im 107/160]
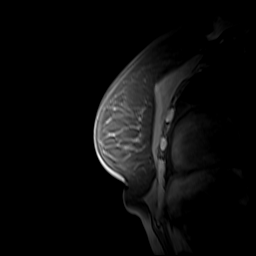
[im 160/160]
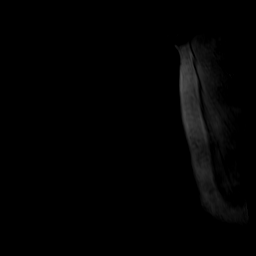

[Series 3: dynamic pre · axial · non-contrast · 1.3mm · 0.73mm/px · z∈[-67,+119]mm · 4 of 144 slices shown]
[im 1/144]
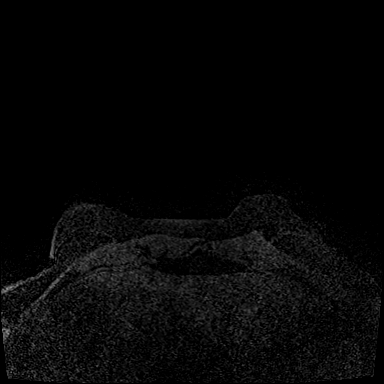
[im 48/144]
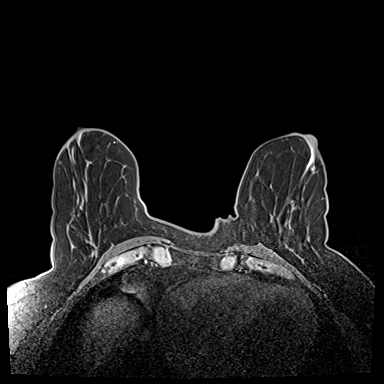
[im 96/144]
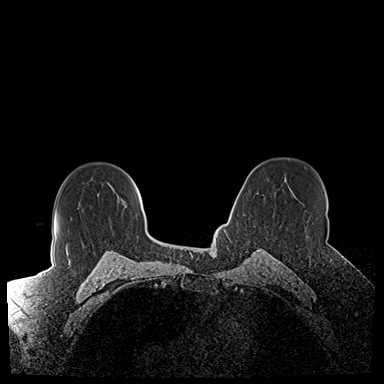
[im 144/144]
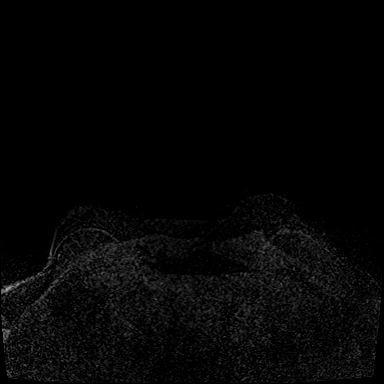

[Series 4: dynamic post 20 · axial · 1.3mm · 0.73mm/px · z∈[-67,+119]mm · 4 of 144 slices shown (1 of 2)]
[im 1/144]
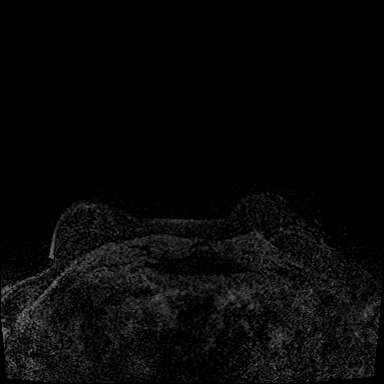
[im 48/144]
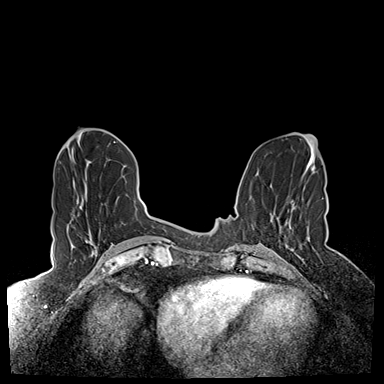
[im 96/144]
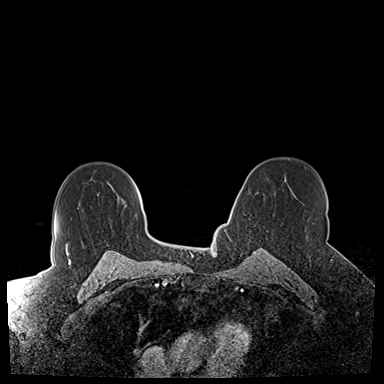
[im 144/144]
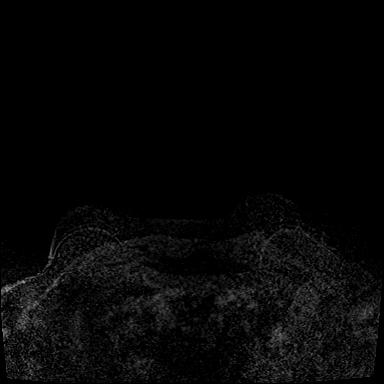

[Series 5: dynamic post 20 · axial · 1.3mm · 0.73mm/px · z∈[-67,+119]mm · 4 of 144 slices shown (2 of 2)]
[im 1/144]
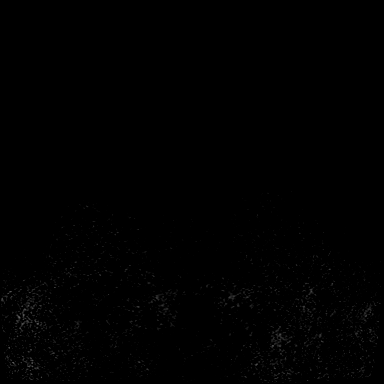
[im 48/144]
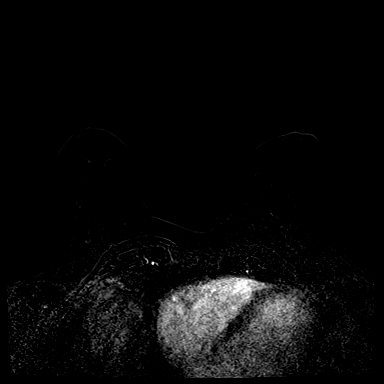
[im 96/144]
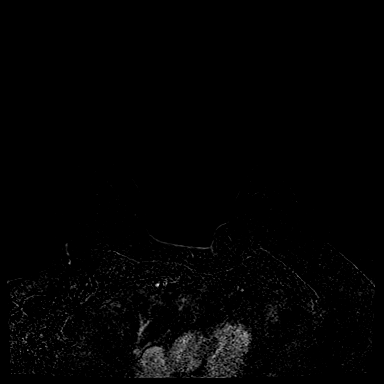
[im 144/144]
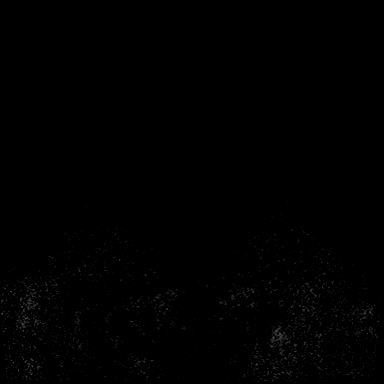

[Series 6: dynamic post 3 · axial · 1.3mm · 0.73mm/px · z∈[-67,+119]mm · 4 of 144 slices shown (1 of 2)]
[im 1/144]
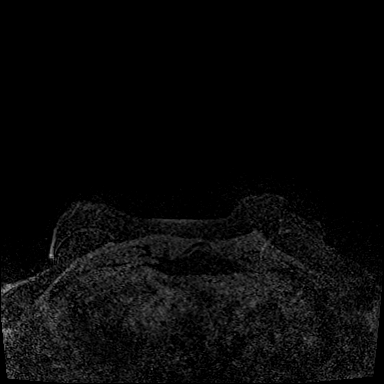
[im 48/144]
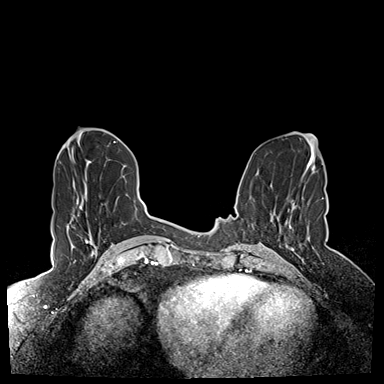
[im 96/144]
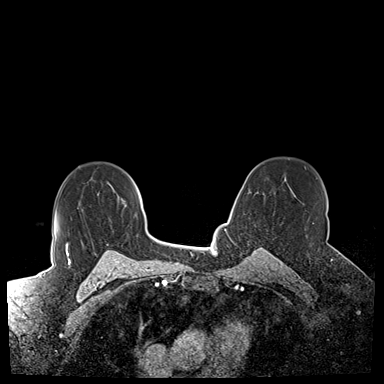
[im 144/144]
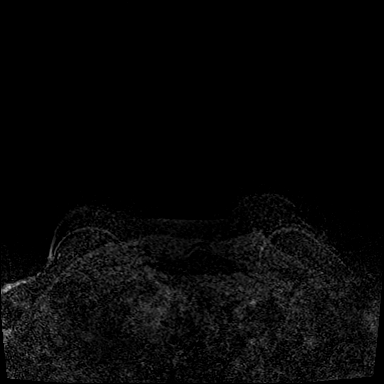

[Series 7: dynamic post 3 · axial · 1.3mm · 0.73mm/px · z∈[-67,+119]mm · 4 of 144 slices shown (2 of 2)]
[im 1/144]
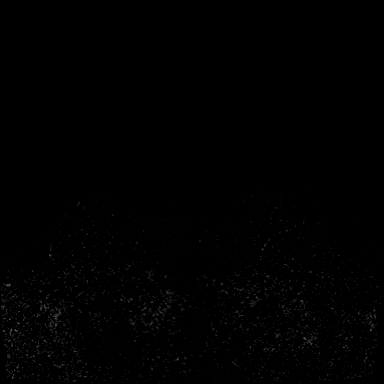
[im 48/144]
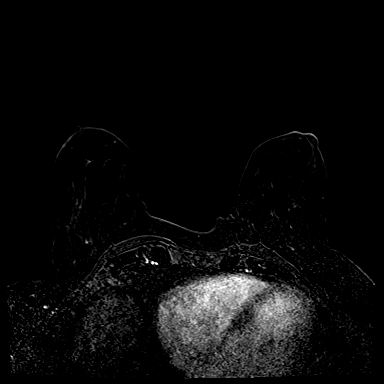
[im 96/144]
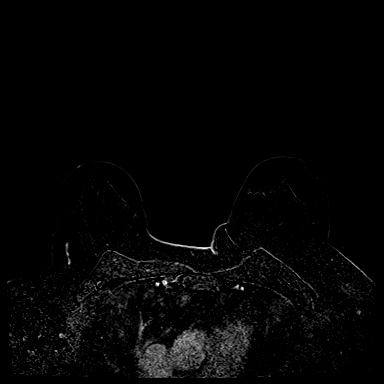
[im 144/144]
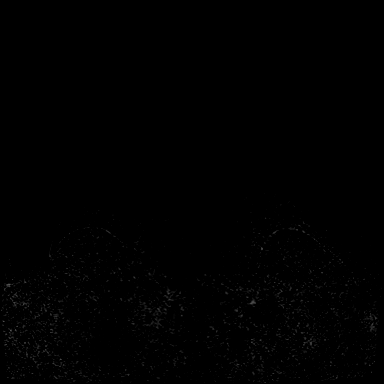

[Series 8: dynamic post 5min · axial · 1.3mm · 0.73mm/px · z∈[-67,+119]mm · 4 of 144 slices shown]
[im 1/144]
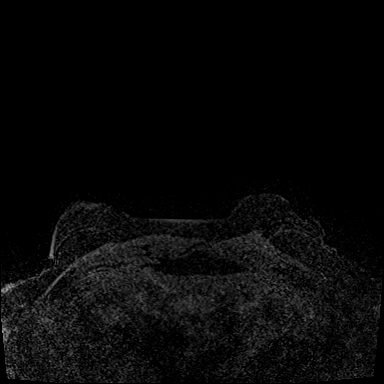
[im 48/144]
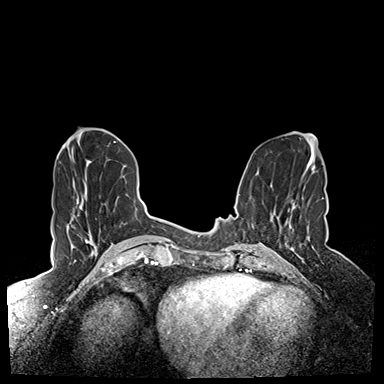
[im 96/144]
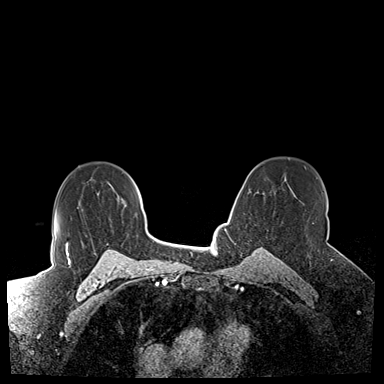
[im 144/144]
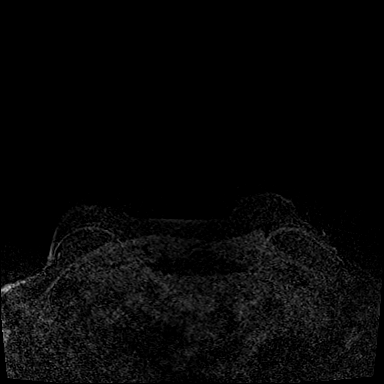

[Series 9: dynamic post 5min_sub · axial · 1.3mm · 0.73mm/px · z∈[-67,+56]mm · 3 of 144 slices shown]
[im 1/144]
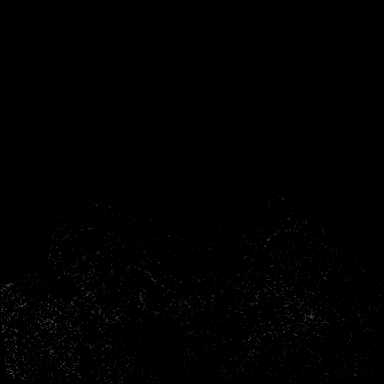
[im 48/144]
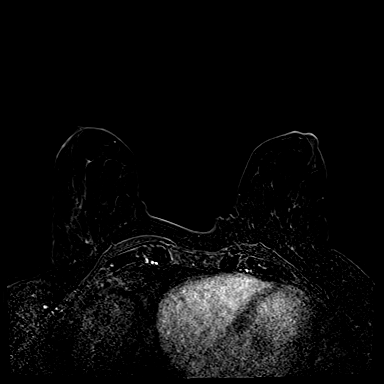
[im 96/144]
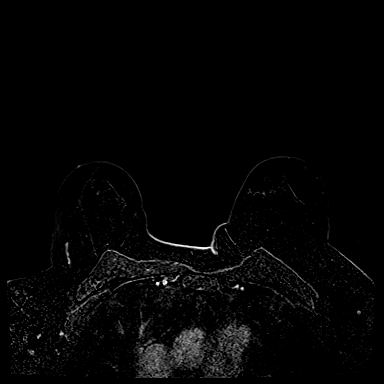

[31 of 48 positions shown; findings below may reference images not displayed]

FINDINGS: I met with the patient, and we discussed the procedure of MRI guided
biopsy, including risks, benefits, and alternatives. Specifically,
we discussed the risks of infection, bleeding, tissue injury, clip
migration, and inadequate sampling. Informed, written consent was
given. The usual time out protocol was performed immediately prior
to the procedure.

Site 1: Lesion quadrant: LEFT breast LATERAL, central, cylinder
shaped clip

Using sterile technique and 1% lidocaine as local anesthetic, under
MRI guidance, a 9 gauge vacuum assisted device was used to perform
core needle biopsy of focus of non mass enhancement in the LATERAL
central portion of the LEFT breast using a LATERAL approach.

At the conclusion of the procedure, cylinder shaped tissue marker
clip was deployed into the biopsy cavity.

Site 2: Lesion quadrant: Posterior LOWER portion of the RIGHT
breast, cylinder-shaped clip

Using sterile technique and 1% lidocaine as local anesthetic, under
MRI guidance, a 9 gauge vacuum assisted device was used to perform
core needle biopsy of focus of non mass enhancement in the posterior
LOWER RIGHT breast using a LATERAL approach.

At the conclusion of the procedure, cylinder shaped tissue marker
clip was deployed into the biopsy cavity.

Follow-up bilateral 2-view mammogram was performed and dictated
separately.
IMPRESSION: MRI guided biopsy of bilateral breast lesions. No apparent
complications.

ADDENDUM:
Pathology revealed ADENOSIS AND FIBROCYSTIC CHANGES of the LEFT
breast, lateral central, (cylinder clip). This was found to be
concordant by Dr. SHERLIN.

Pathology revealed COMPLEX SCLEROSING LESION WITH USUAL DUCTAL
HYPERPLASIA of the RIGHT breast, posterior lower, (cylinder clip).
This was found to be concordant by Dr. SHERLIN, with
surgical consultation for consideration of excision recommended.

Pathology results were discussed with the patient by telephone. The
patient reported doing well after the biopsies with tenderness at
the sites. Post biopsy instructions and care were reviewed and
questions were answered. The patient was encouraged to call The
direct phone number was provided.

The patient was instructed to return for a bilateral breast MRI in 6
months, per protocol.

Surgical consultation has been arranged with Dr. SHERLIN at
[REDACTED] on [DATE].

NOTE; The patient has an additional biopsy proven COMPLEX SCLEROSING
LESION WITH USUAL DUCTAL HYPERPLASIA AND APOCRINE METAPLASIA,
FIBROCYSTIC CHANGE WITH USUAL DUCTAL HYPERPLASIA AND APOCRINE
METAPLASIA of the RIGHT breast, upper outer quadrant, anterior,
(barbell clip) from [DATE].

Pathology results reported by SHERLIN, RN on [DATE].

*** End of Addendum ***
FINDINGS: I met with the patient, and we discussed the procedure of MRI guided
biopsy, including risks, benefits, and alternatives. Specifically,
we discussed the risks of infection, bleeding, tissue injury, clip
migration, and inadequate sampling. Informed, written consent was
given. The usual time out protocol was performed immediately prior
to the procedure.

Site 1: Lesion quadrant: LEFT breast LATERAL, central, cylinder
shaped clip

Using sterile technique and 1% lidocaine as local anesthetic, under
MRI guidance, a 9 gauge vacuum assisted device was used to perform
core needle biopsy of focus of non mass enhancement in the LATERAL
central portion of the LEFT breast using a LATERAL approach.

At the conclusion of the procedure, cylinder shaped tissue marker
clip was deployed into the biopsy cavity.

Site 2: Lesion quadrant: Posterior LOWER portion of the RIGHT
breast, cylinder-shaped clip

Using sterile technique and 1% lidocaine as local anesthetic, under
MRI guidance, a 9 gauge vacuum assisted device was used to perform
core needle biopsy of focus of non mass enhancement in the posterior
LOWER RIGHT breast using a LATERAL approach.

At the conclusion of the procedure, cylinder shaped tissue marker
clip was deployed into the biopsy cavity.

Follow-up bilateral 2-view mammogram was performed and dictated
separately.
IMPRESSION: MRI guided biopsy of bilateral breast lesions. No apparent
complications.

## 2020-12-01 IMAGING — MG MM BREAST LOCALIZATION CLIP
4 series · 4 of 12 positions shown · non-contrast
Comparison: Previous exam(s).

CLINICAL DATA: Status post MR guided core biopsy of the RIGHT
breast.

EXAM:
3D DIAGNOSTIC RIGHT MAMMOGRAM POST MRI BIOPSY

[R ML synth-2D]
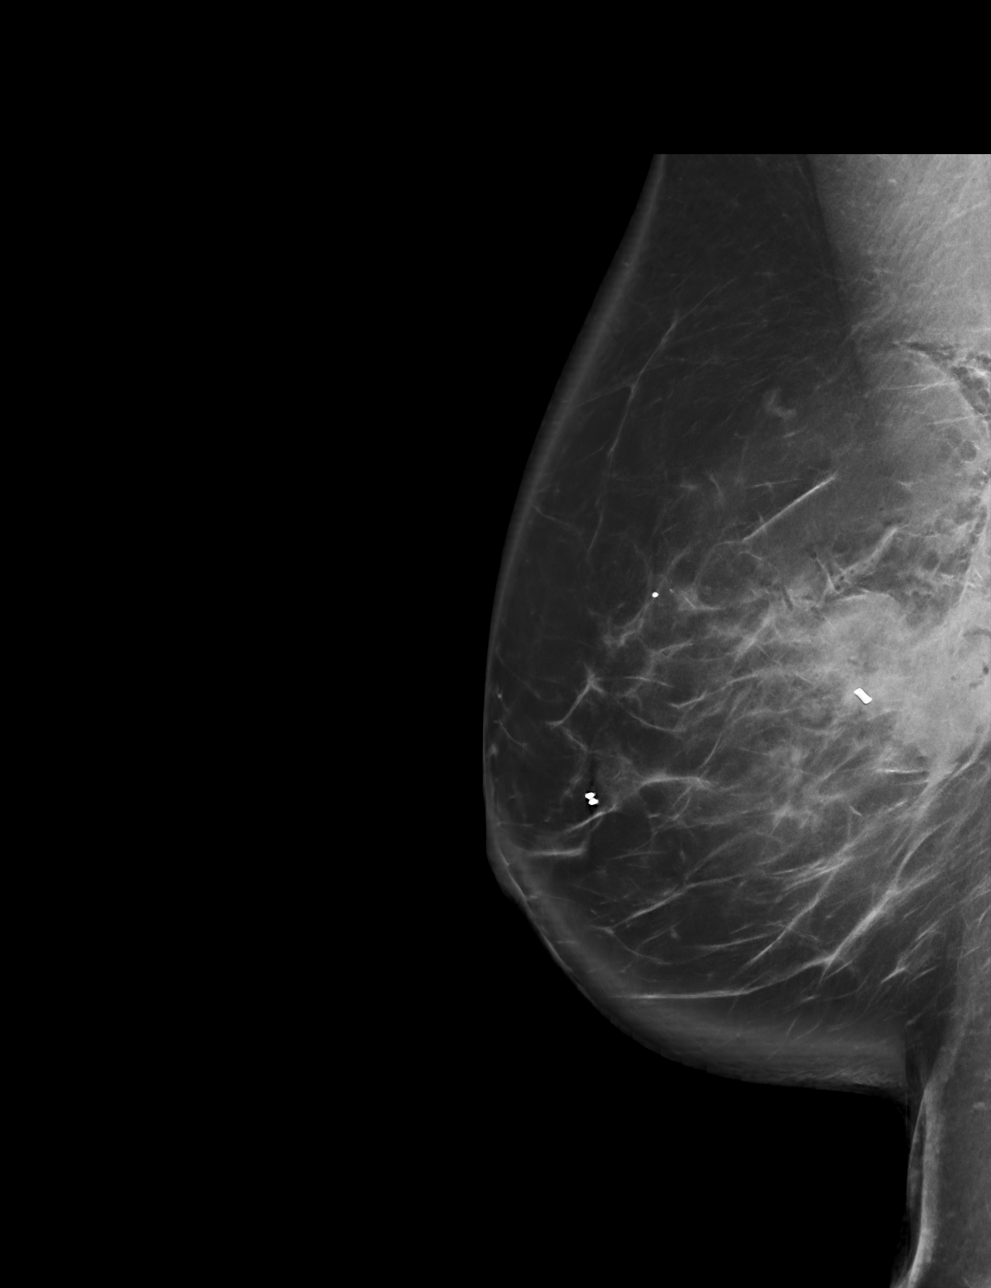

[R CC synth-2D]
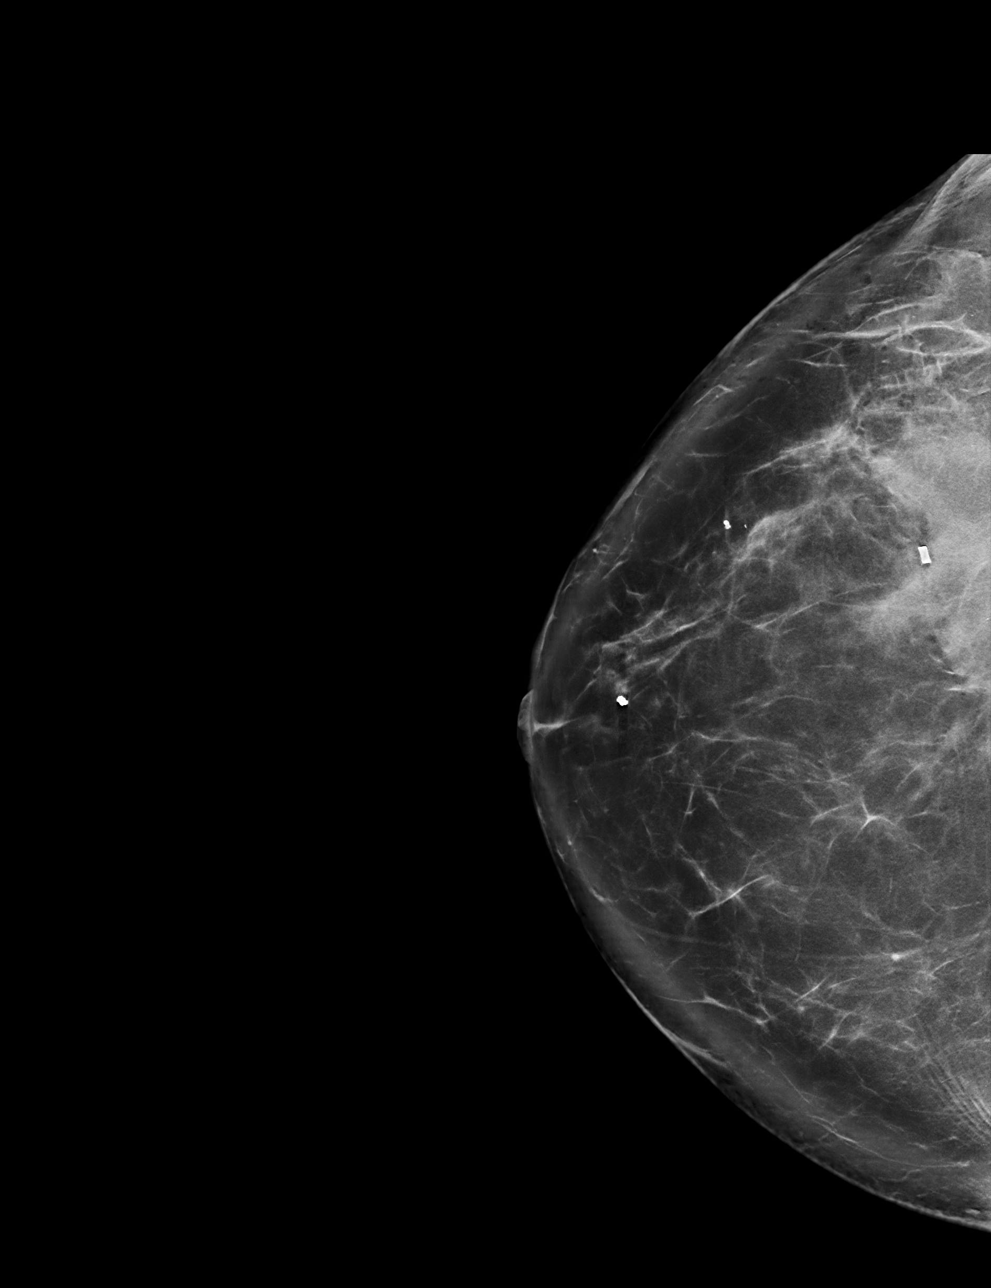

[R ML tomo · tomo slice 53/104.0]
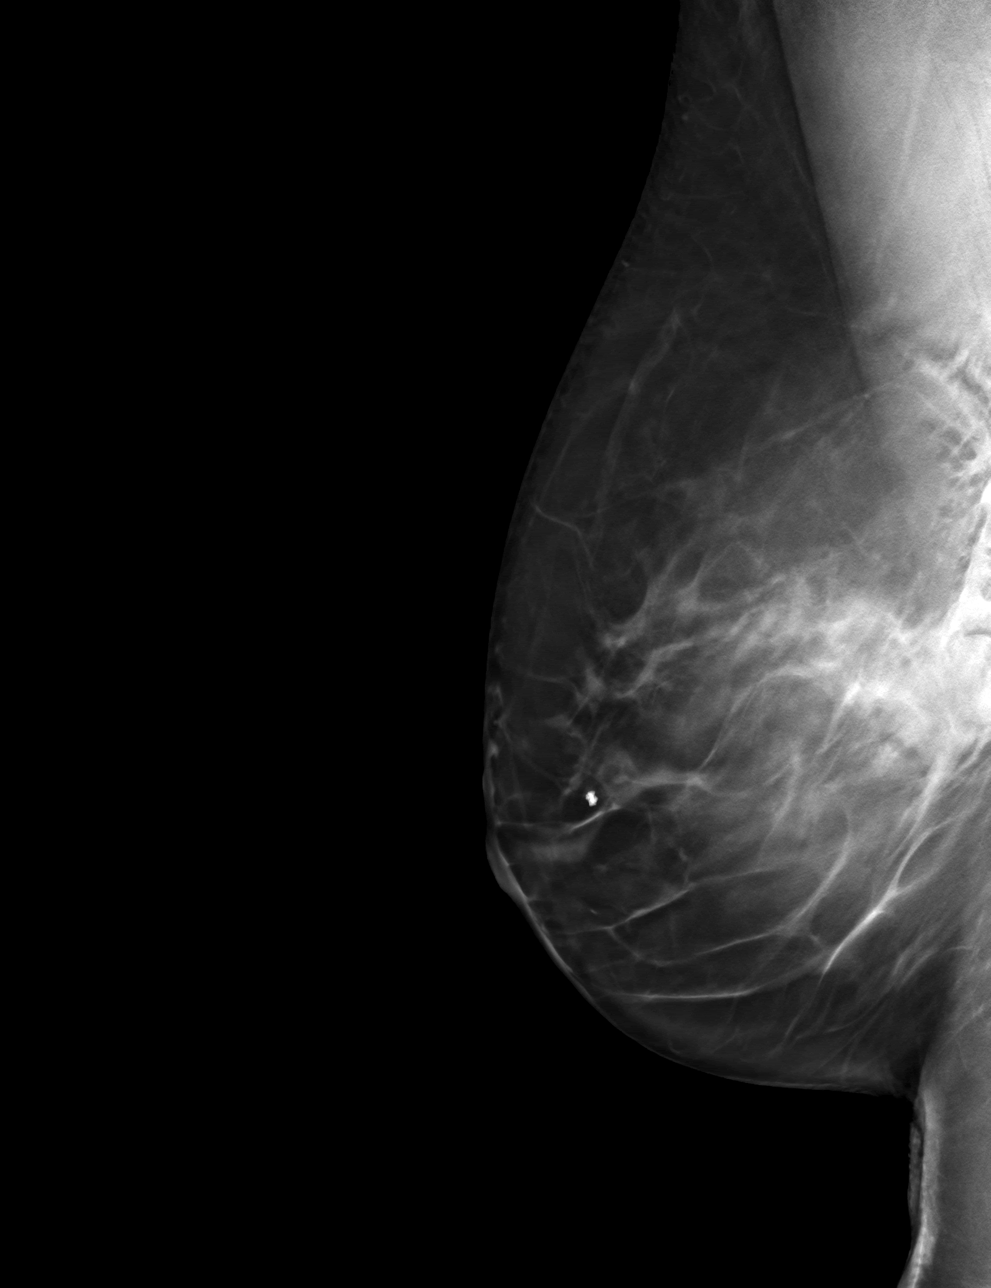

[R CC tomo · tomo slice 48/95.0]
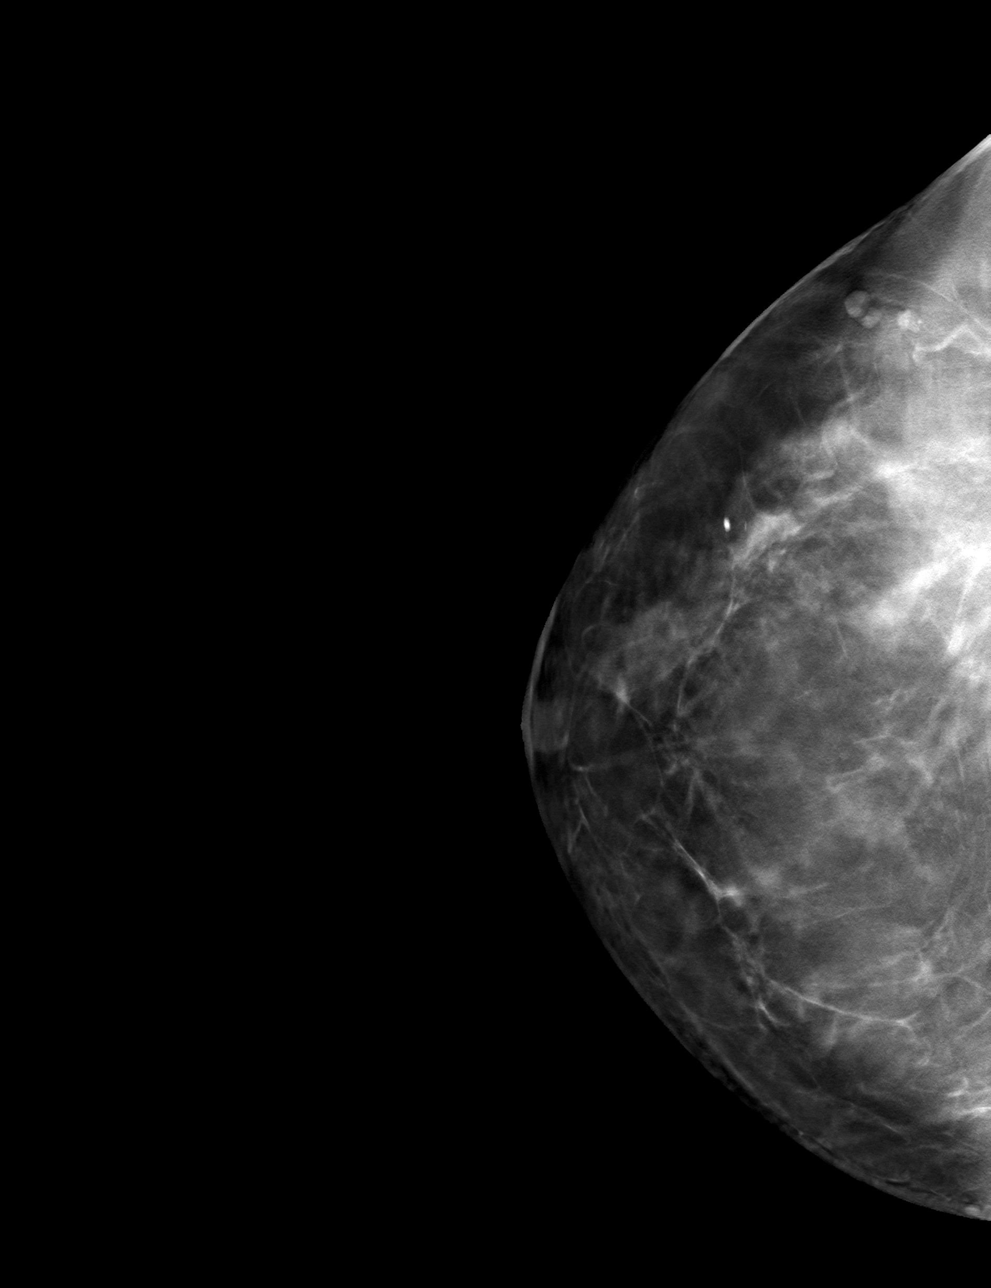

[4 of 12 positions shown; findings below may reference images not displayed]

FINDINGS: 3D Mammographic images were obtained following MRI guided biopsy of
focal enhancement in the posterior LOWER RIGHT breast and placement
of a cylinder-shaped. The biopsy marking clip is in expected
position at the site of biopsy. Post biopsy hematoma is present,
measuring approximately 3.1 centimeters.
IMPRESSION: Appropriate positioning of the cylinder shaped biopsy marking clip
at the site of biopsy in the posterior LOWER RIGHT breast.

Final Assessment: Post Procedure Mammograms for Marker Placement

## 2020-12-01 IMAGING — MG MM BREAST LOCALIZATION CLIP
4 series · 4 of 12 positions shown · non-contrast
Comparison: Previous exam(s).

CLINICAL DATA: Status post MR guided core biopsy of the LEFT
breast.

EXAM:
3D DIAGNOSTIC LEFT MAMMOGRAM POST MRI BIOPSY

[L ML synth-2D]
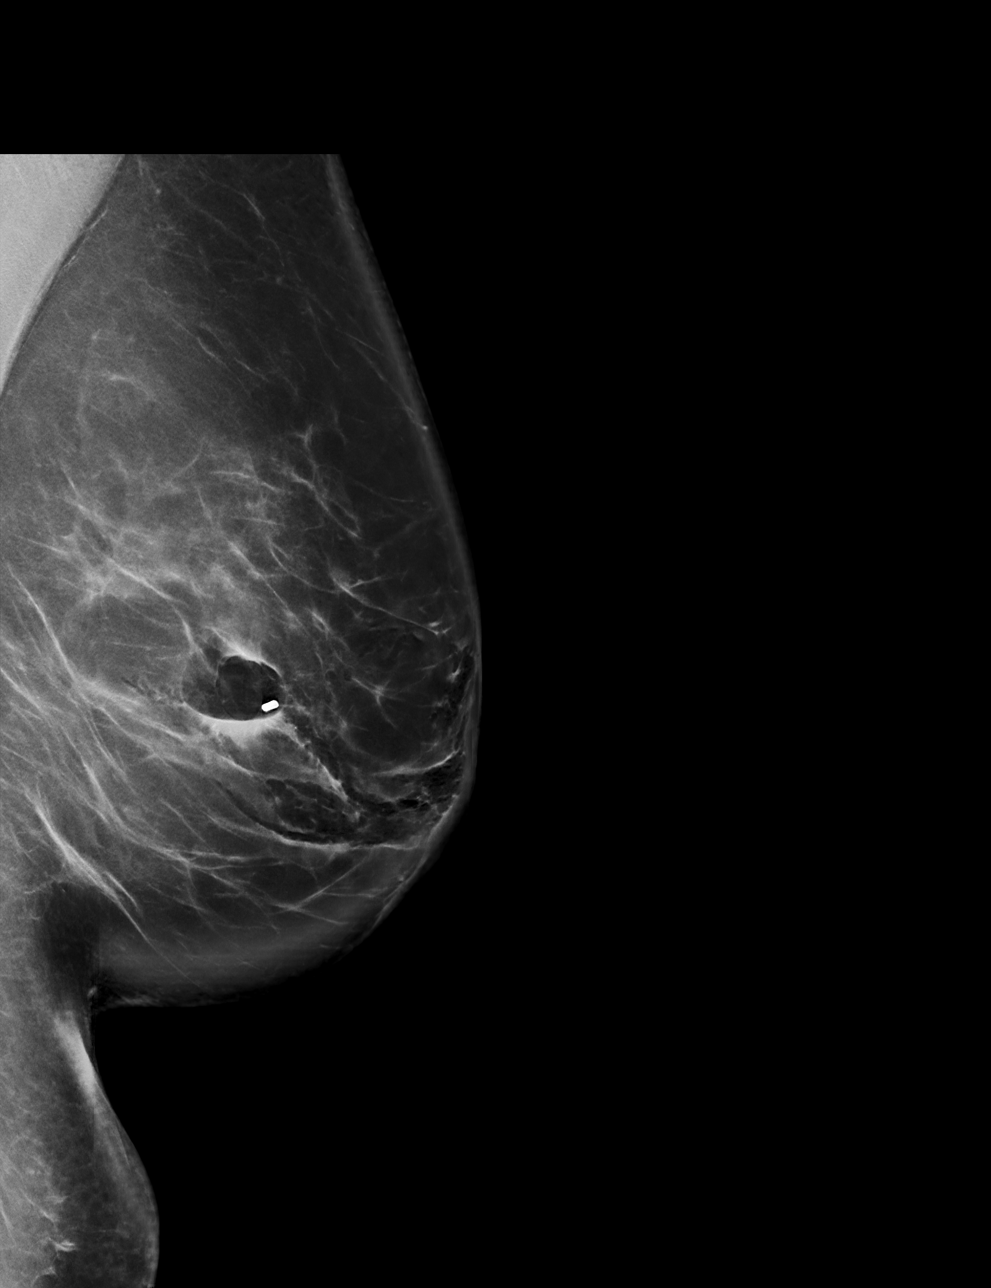

[L CC synth-2D]
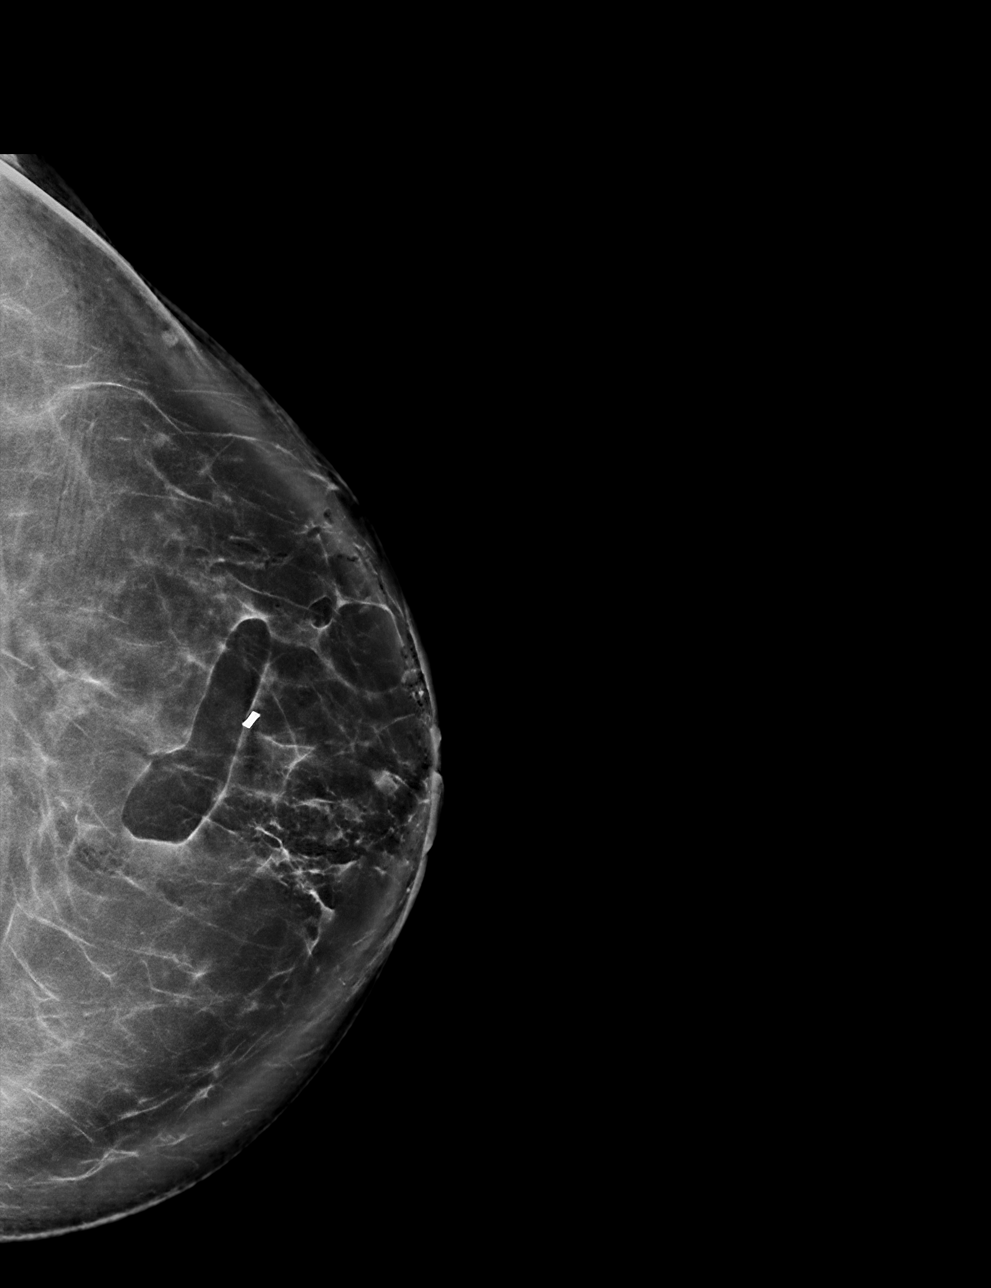

[L ML tomo · tomo slice 53/106.0]
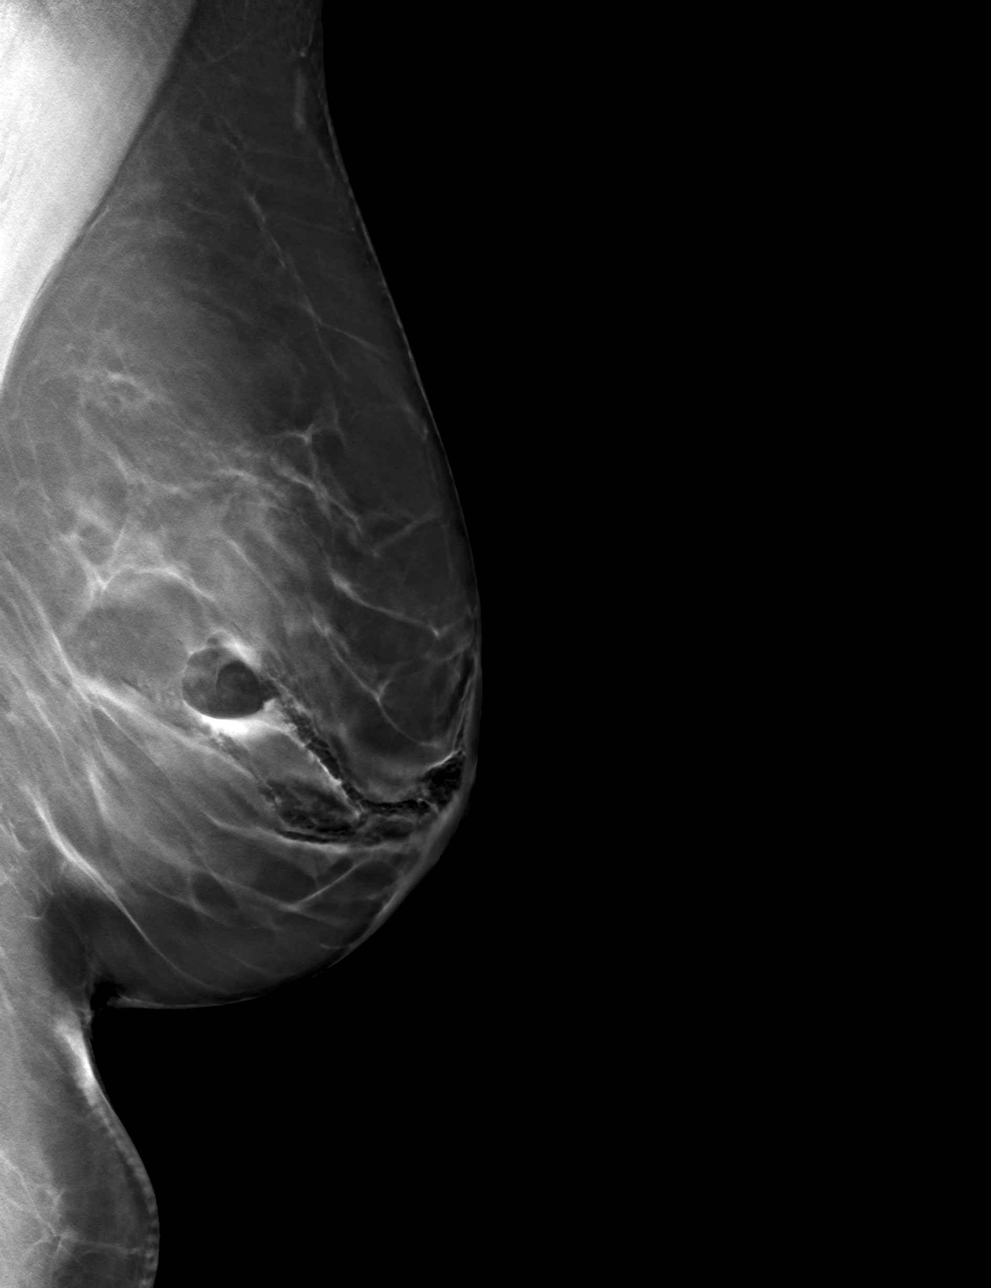

[L CC tomo · tomo slice 52/103.0]
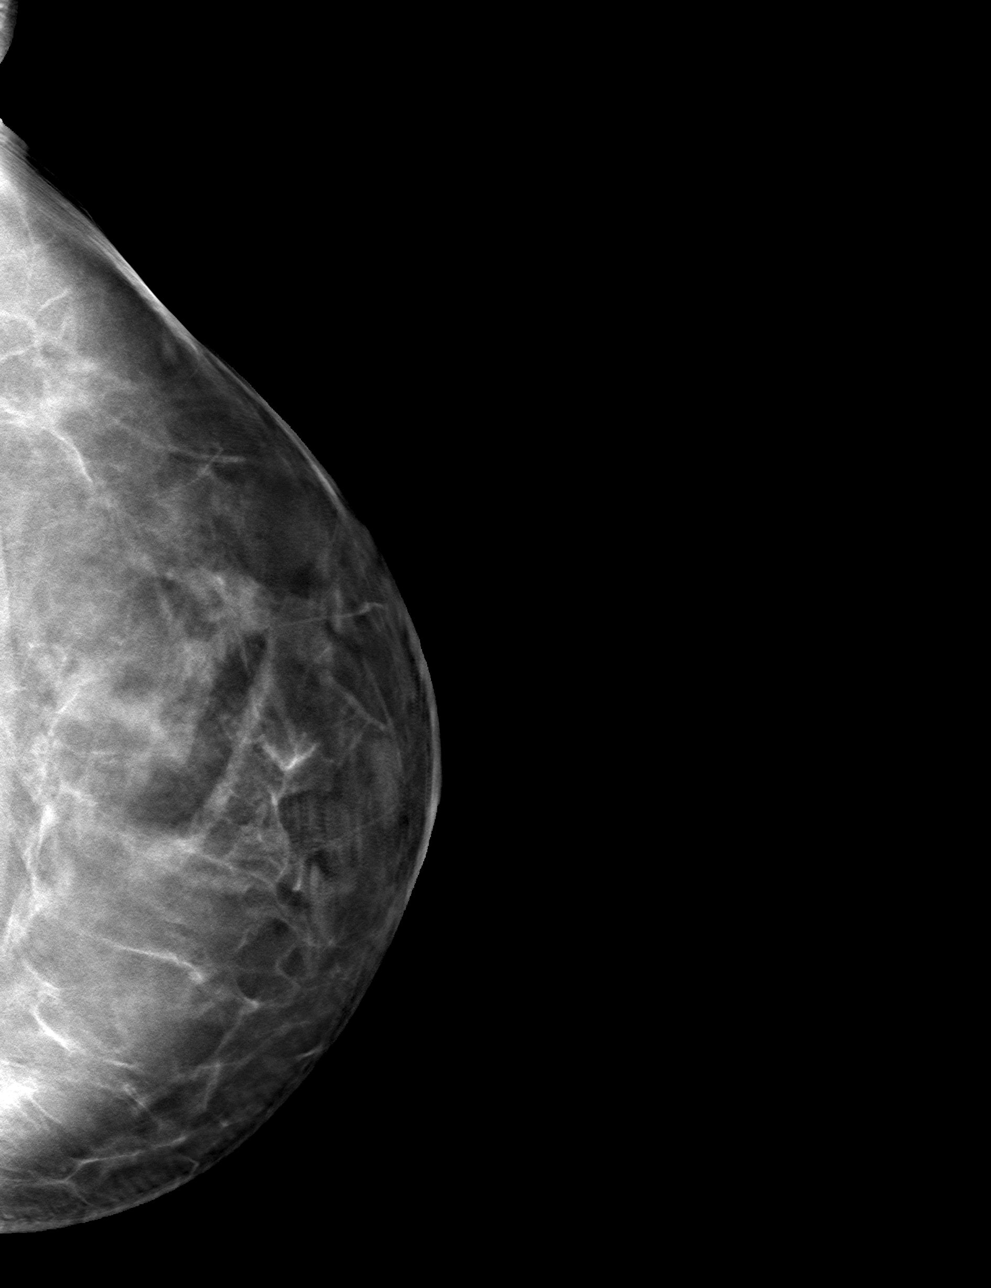

[4 of 12 positions shown; findings below may reference images not displayed]

FINDINGS: 3D Mammographic images were obtained following MRI guided biopsy of
focal enhancement in the LATERAL central portion of the LEFT breast
and placement of a cylinder-shaped clip. The biopsy marking clip is
in expected position at the site of biopsy.
IMPRESSION: Appropriate positioning of the cylinder shaped biopsy marking clip
at the site of biopsy in the LATERAL central LEFT breast.

Final Assessment: Post Procedure Mammograms for Marker Placement

## 2020-12-01 MED ORDER — GADOBUTROL 1 MMOL/ML IV SOLN
7.0000 mL | Freq: Once | INTRAVENOUS | Status: AC | PRN
  Administered 2020-12-01: 7 mL via INTRAVENOUS

## 2020-12-08 ENCOUNTER — Ambulatory Visit: Payer: Self-pay | Admitting: Surgery

## 2020-12-08 DIAGNOSIS — N6489 Other specified disorders of breast: Secondary | ICD-10-CM

## 2020-12-09 ENCOUNTER — Other Ambulatory Visit: Payer: Self-pay | Admitting: Surgery

## 2020-12-09 DIAGNOSIS — N6489 Other specified disorders of breast: Secondary | ICD-10-CM

## 2020-12-16 HISTORY — PX: BREAST EXCISIONAL BIOPSY: SUR124

## 2020-12-30 ENCOUNTER — Other Ambulatory Visit: Payer: Self-pay

## 2020-12-30 ENCOUNTER — Encounter (HOSPITAL_BASED_OUTPATIENT_CLINIC_OR_DEPARTMENT_OTHER): Payer: Self-pay | Admitting: Surgery

## 2021-01-05 ENCOUNTER — Other Ambulatory Visit: Payer: Self-pay

## 2021-01-05 ENCOUNTER — Ambulatory Visit
Admission: RE | Admit: 2021-01-05 | Discharge: 2021-01-05 | Disposition: A | Discharge status: Home / Self Care | Payer: BC Managed Care – PPO | Source: Ambulatory Visit | Attending: Surgery | Admitting: Surgery

## 2021-01-05 IMAGING — MG MM PLC BREAST LOC DEV 1ST LESION INC*R*
8 of 15 series · 8 of 15 positions shown · non-contrast
Comparison: Previous exam(s).

CLINICAL DATA: 65-year-old female for radioactive seed localization
of 2 separate complex sclerosing lesions within the RIGHT breast.

EXAM:
MAMMOGRAPHIC GUIDED RADIOACTIVE SEED LOCALIZATION OF THE RIGHT
BREAST X 2

[R CC (1 of 4)]
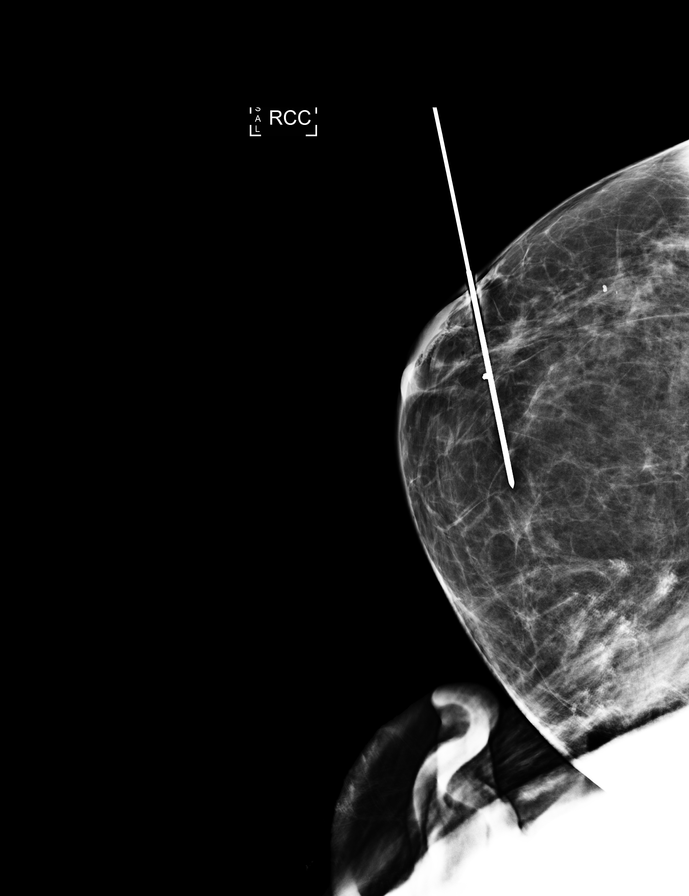

[R CC (2 of 4)]
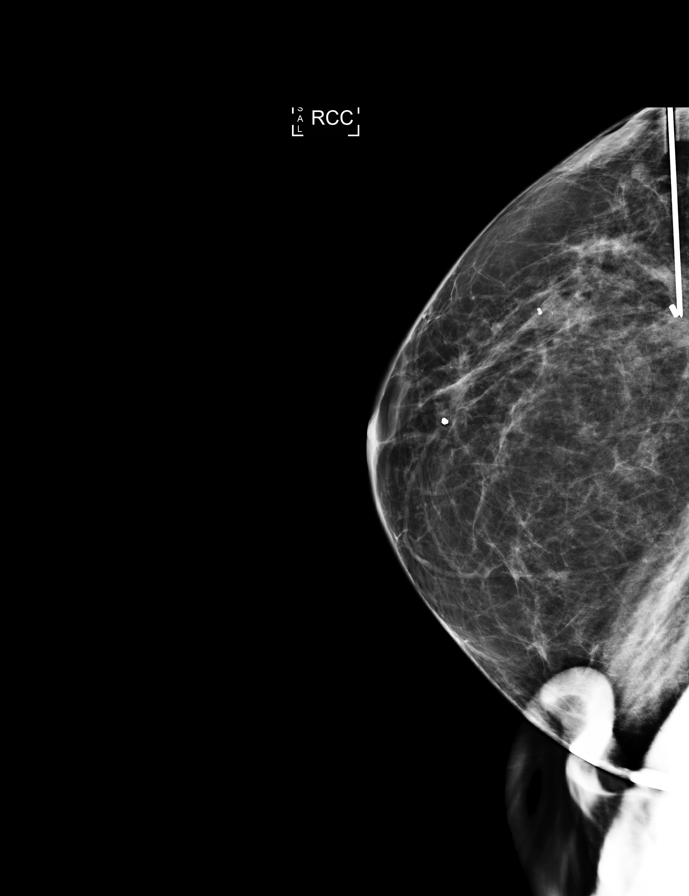

[R CC (3 of 4)]
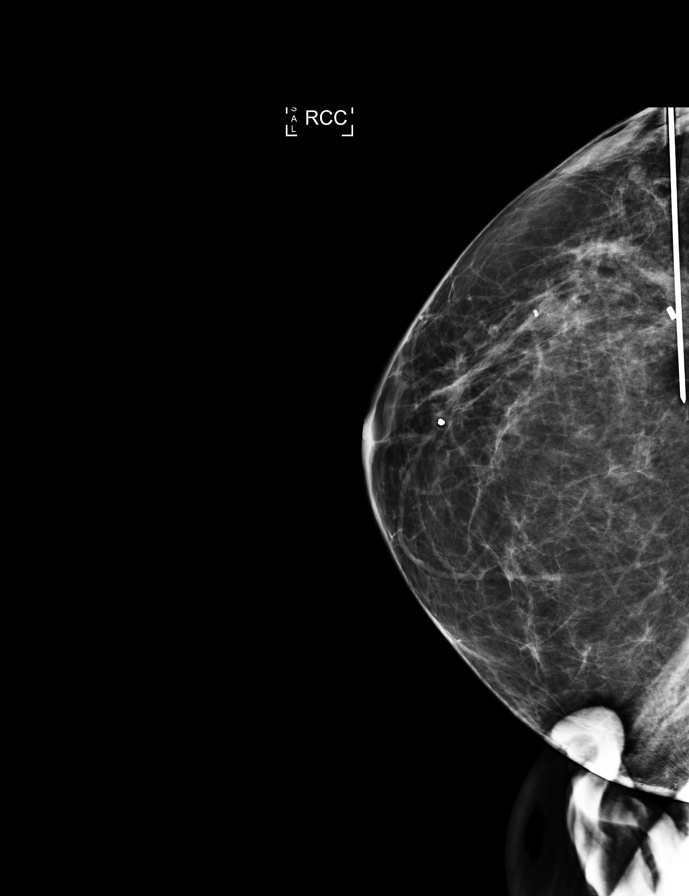

[R LM (1 of 4)]
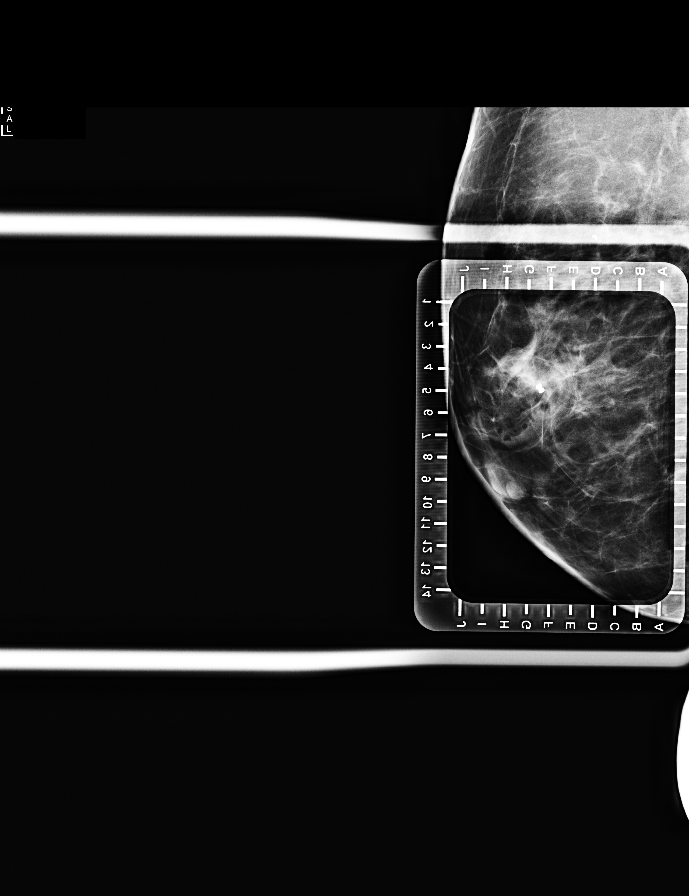

[R LM (2 of 4)]
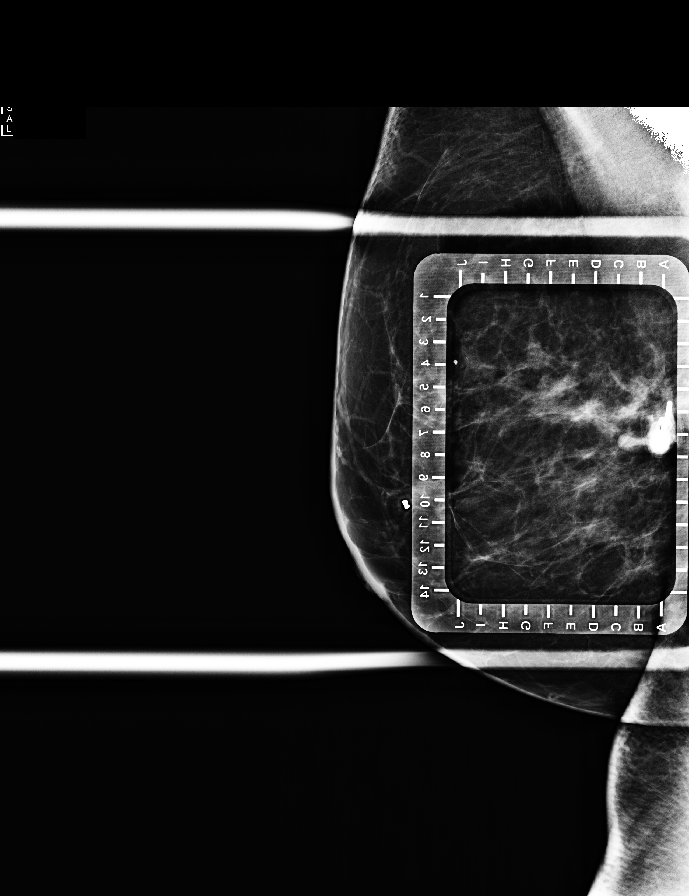

[R LM (3 of 4)]
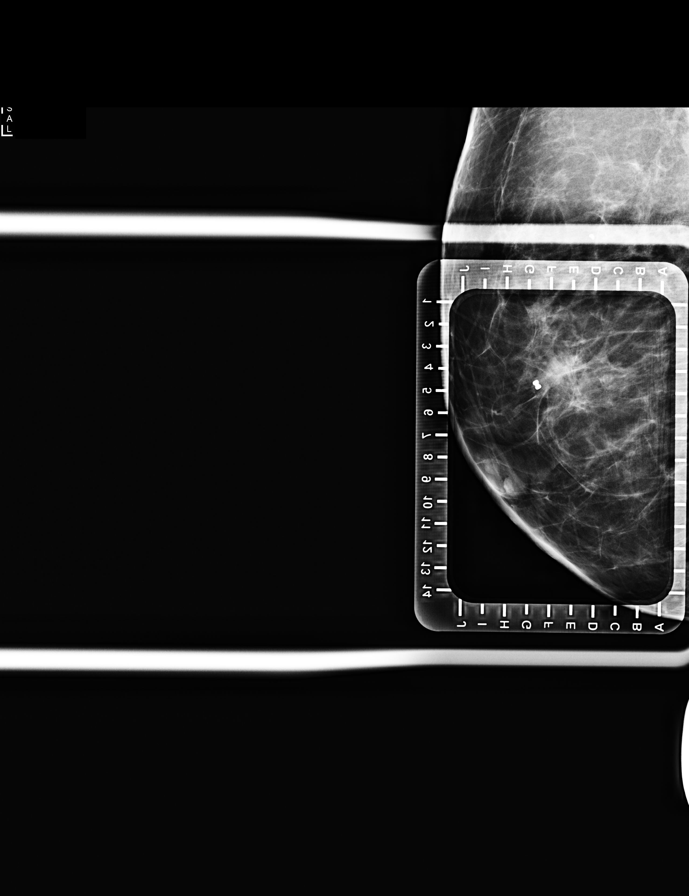

[R CC (4 of 4)]
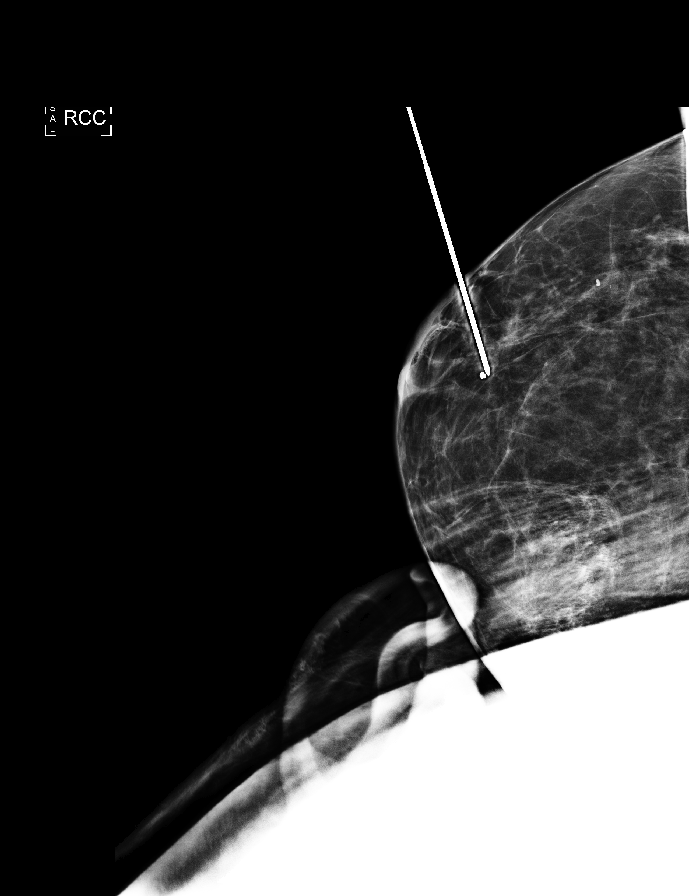

[R LM (4 of 4)]
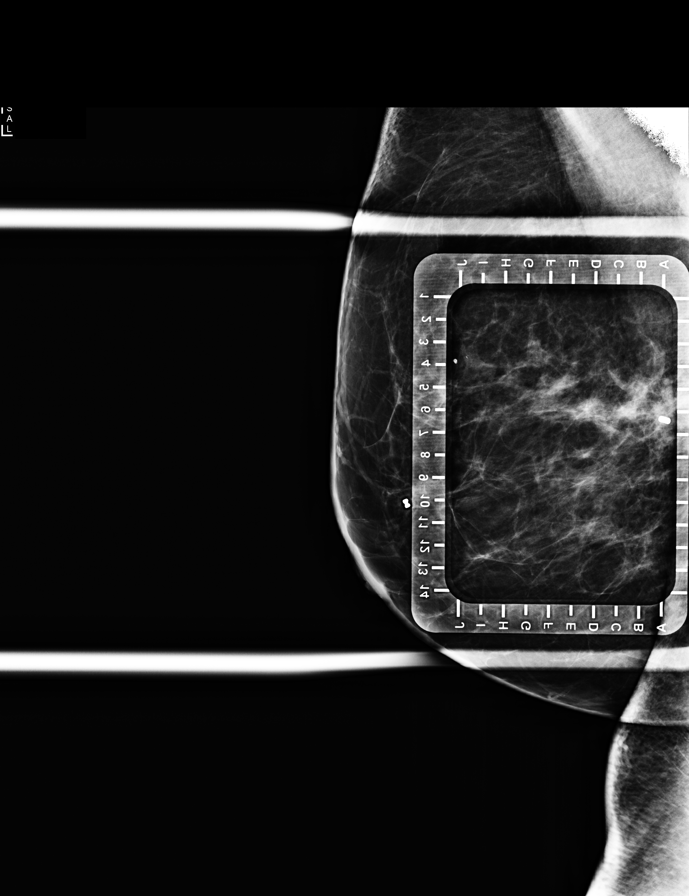

[8 of 15 positions shown; findings below may reference images not displayed]

FINDINGS: Patient presents for radioactive seed localizations prior to RIGHT
breast excisions. I met with the patient and we discussed the
procedure of seed localization including benefits and alternatives.
We discussed the high likelihood of a successful procedure. We
discussed the risks of the procedure including infection, bleeding,
tissue injury and further surgery. We discussed the low dose of
radioactivity involved in the procedure. Informed, written consent
was given.

The usual time-out protocol was performed immediately prior to the
procedure.

MAMMOGRAPHIC GUIDED RADIOACTIVE SEED LOCALIZATION OF THE RIGHT
BREAST

Using mammographic guidance, sterile technique, 1% lidocaine and an
[C0] radioactive seed, the CYLINDER clip was localized using a
LATERAL approach. The follow-up mammogram images confirm the seed in
the expected location and were marked for Dr. MARIE MATHILDE.

Follow-up survey of the patient confirms presence of the radioactive
seed.

Order number of [C0] seed:  [PHONE_NUMBER].

Total activity:  0.261 millicuries.  Reference Date: [DATE].

MAMMOGRAPHIC GUIDED RADIOACTIVE SEED LOCALIZATION OF THE RIGHT
BREAST

Using mammographic guidance, sterile technique, 1% lidocaine and an
[C0] radioactive seed, the BARBELL clip was localized using a
LATERAL approach. The follow-up mammogram images confirm the seed in
the expected location and were marked for Dr. MARIE MATHILDE.

Follow-up survey of the patient confirms presence of the radioactive
seed.

Order number of [C0] seed:  [PHONE_NUMBER].

Total activity:  0.260 millicuries.  Reference Date: [DATE].

The patient tolerated the procedure well and was released from the
[REDACTED]. She was given instructions regarding seed removal.
IMPRESSION: Radioactive seed localizations of 2 separate RIGHT breast complex
sclerosing lesions. No apparent complications.

## 2021-01-05 NOTE — Progress Notes (Signed)

## 2021-01-05 NOTE — Anesthesia Preprocedure Evaluation (Addendum)
Anesthesia Evaluation  Patient identified by MRN, date of birth, ID band Patient awake    Reviewed: Allergy & Precautions, NPO status , Patient's Chart, lab work & pertinent test results  History of Anesthesia Complications Negative for: history of anesthetic complications  Airway Mallampati: II  TM Distance: >3 FB Neck ROM: Full    Dental no notable dental hx.    Pulmonary sleep apnea and Continuous Positive Airway Pressure Ventilation ,    Pulmonary exam normal        Cardiovascular negative cardio ROS Normal cardiovascular exam     Neuro/Psych Depression negative neurological ROS     GI/Hepatic Neg liver ROS, GERD  Medicated and Controlled,  Endo/Other  negative endocrine ROS  Renal/GU negative Renal ROS  negative genitourinary   Musculoskeletal  (+) Arthritis ,   Abdominal   Peds  Hematology negative hematology ROS (+)   Anesthesia Other Findings Day of surgery medications reviewed with patient.  Reproductive/Obstetrics negative OB ROS                            Anesthesia Physical Anesthesia Plan  ASA: 2  Anesthesia Plan: General   Post-op Pain Management: Tylenol PO (pre-op)   Induction: Intravenous  PONV Risk Score and Plan: 3 and Treatment may vary due to age or medical condition, Ondansetron, Dexamethasone and Midazolam  Airway Management Planned: LMA  Additional Equipment: None  Intra-op Plan:   Post-operative Plan: Extubation in OR  Informed Consent: I have reviewed the patients History and Physical, chart, labs and discussed the procedure including the risks, benefits and alternatives for the proposed anesthesia with the patient or authorized representative who has indicated his/her understanding and acceptance.     Dental advisory given  Plan Discussed with: CRNA  Anesthesia Plan Comments:        Anesthesia Quick Evaluation

## 2021-01-06 ENCOUNTER — Ambulatory Visit
Admission: RE | Admit: 2021-01-06 | Discharge: 2021-01-06 | Disposition: A | Discharge status: Home / Self Care | Payer: BC Managed Care – PPO | Source: Ambulatory Visit | Attending: Surgery | Admitting: Surgery

## 2021-01-06 ENCOUNTER — Ambulatory Visit (HOSPITAL_BASED_OUTPATIENT_CLINIC_OR_DEPARTMENT_OTHER): Payer: BC Managed Care – PPO | Admitting: Anesthesiology

## 2021-01-06 ENCOUNTER — Encounter (HOSPITAL_BASED_OUTPATIENT_CLINIC_OR_DEPARTMENT_OTHER): Payer: Self-pay | Admitting: Surgery

## 2021-01-06 ENCOUNTER — Ambulatory Visit (HOSPITAL_BASED_OUTPATIENT_CLINIC_OR_DEPARTMENT_OTHER)
Admission: RE | Admit: 2021-01-06 | Discharge: 2021-01-06 | Disposition: A | Discharge status: Home / Self Care | Payer: BC Managed Care – PPO | Attending: Surgery | Admitting: Surgery

## 2021-01-06 ENCOUNTER — Other Ambulatory Visit: Payer: Self-pay

## 2021-01-06 ENCOUNTER — Encounter (HOSPITAL_BASED_OUTPATIENT_CLINIC_OR_DEPARTMENT_OTHER)
Admission: RE | Disposition: A | Discharge status: Home / Self Care | Payer: Self-pay | Source: Home / Self Care | Attending: Surgery

## 2021-01-06 DIAGNOSIS — R921 Mammographic calcification found on diagnostic imaging of breast: Secondary | ICD-10-CM | POA: Insufficient documentation

## 2021-01-06 DIAGNOSIS — G4730 Sleep apnea, unspecified: Secondary | ICD-10-CM | POA: Diagnosis not present

## 2021-01-06 DIAGNOSIS — K219 Gastro-esophageal reflux disease without esophagitis: Secondary | ICD-10-CM | POA: Diagnosis not present

## 2021-01-06 DIAGNOSIS — N6489 Other specified disorders of breast: Secondary | ICD-10-CM | POA: Insufficient documentation

## 2021-01-06 HISTORY — DX: Palpitations: R00.2

## 2021-01-06 HISTORY — DX: Depression, unspecified: F32.A

## 2021-01-06 HISTORY — DX: Gastro-esophageal reflux disease without esophagitis: K21.9

## 2021-01-06 HISTORY — DX: Sleep apnea, unspecified: G47.30

## 2021-01-06 HISTORY — DX: Unspecified osteoarthritis, unspecified site: M19.90

## 2021-01-06 HISTORY — PX: BREAST LUMPECTOMY WITH RADIOACTIVE SEED LOCALIZATION: SHX6424

## 2021-01-06 IMAGING — MG MM BREAST SURGICAL SPECIMEN
1 series · 2 of 2 positions shown · non-contrast
Comparison: Previous exam(s).

CLINICAL DATA: Two specimen radiograph status post right breast
excisional biopsies for complex sclerosing lesions.

EXAM:
SPECIMEN RADIOGRAPH OF THE RIGHT BREAST

[Series 2: R · right · 0.07mm/px · 2 of 2 slices shown]
[im 1/2]
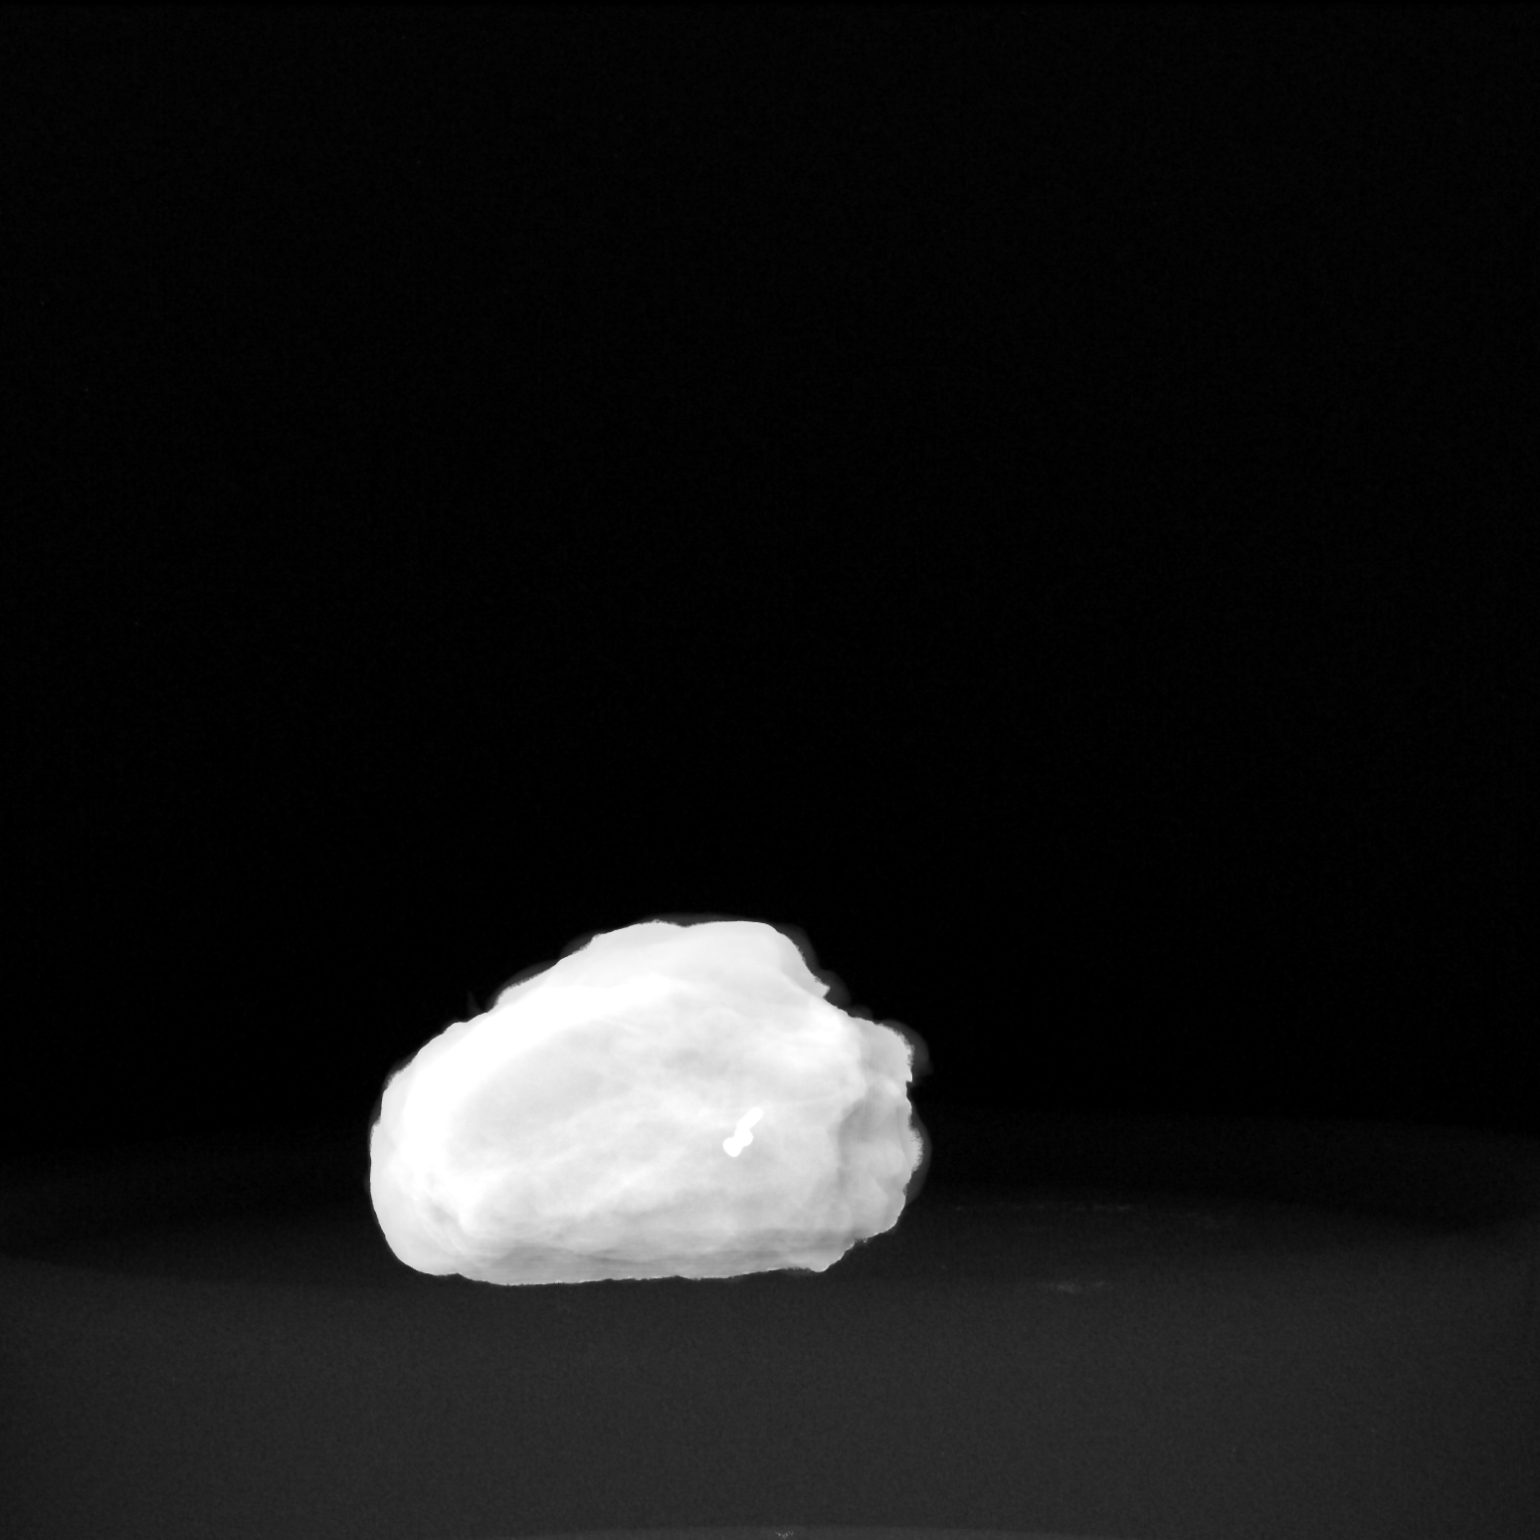
[im 2/2]
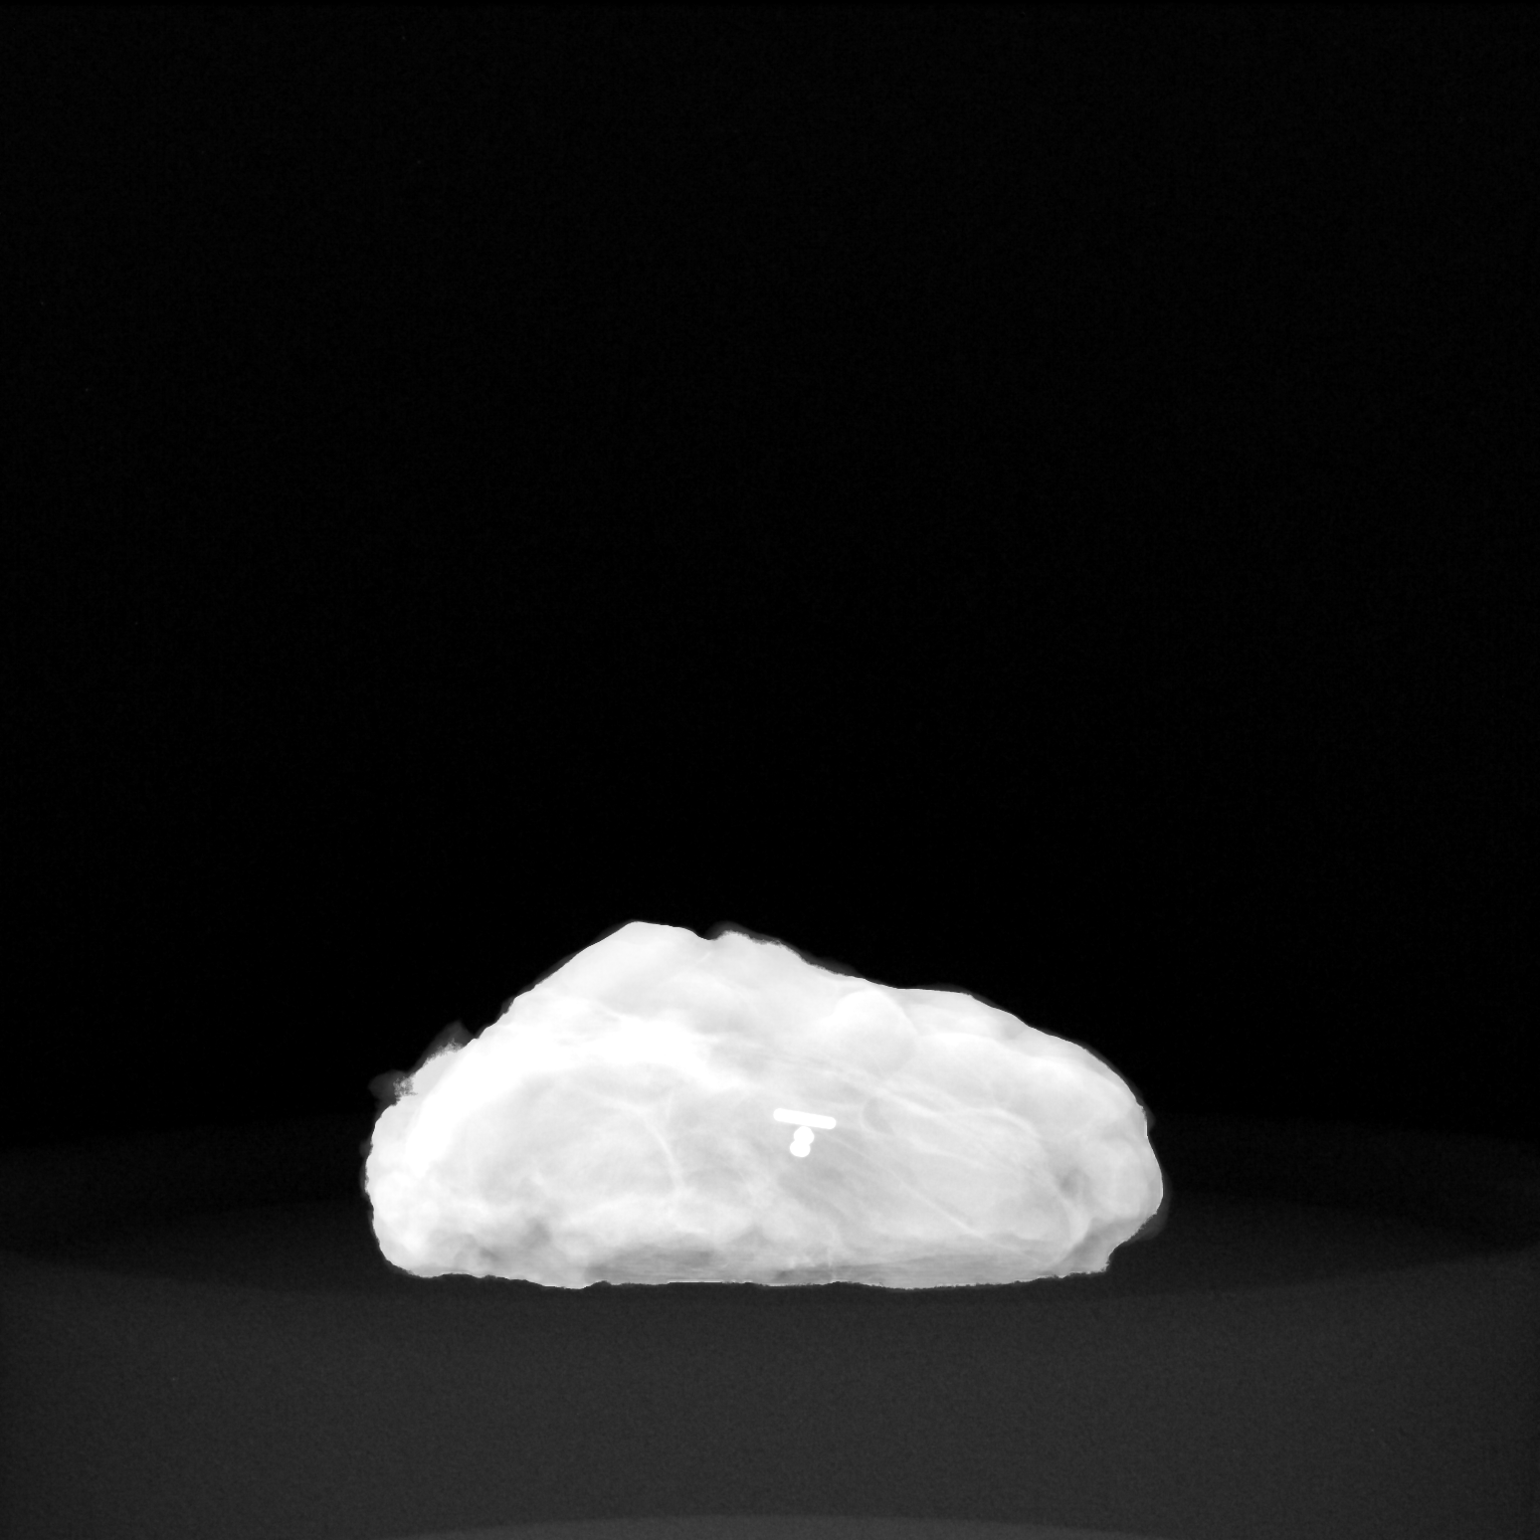

[2 of 2 positions shown; findings below may reference images not displayed]

FINDINGS: Status post excision of the right breast. The specimen from the
anterior aspect of the right breast demonstrates a dumbbell-shaped
biopsy marking clip and a radioactive seed which are completely
intact. The specimen from the posterior right breast demonstrates a
cylinder shaped biopsy marking clip and a radioactive seed, which
are completely intact. These findings were communicated with the OR
at [DATE] a.m.
IMPRESSION: Two specimen radiographs of the right breast.

## 2021-01-06 IMAGING — MG MM BREAST SURGICAL SPECIMEN
1 series · 2 of 2 positions shown · non-contrast
Comparison: Previous exam(s).

CLINICAL DATA: Two specimen radiograph status post right breast
excisional biopsies for complex sclerosing lesions.

EXAM:
SPECIMEN RADIOGRAPH OF THE RIGHT BREAST

[Series 1: R · right · 0.07mm/px · 2 of 2 slices shown]
[im 1/2]
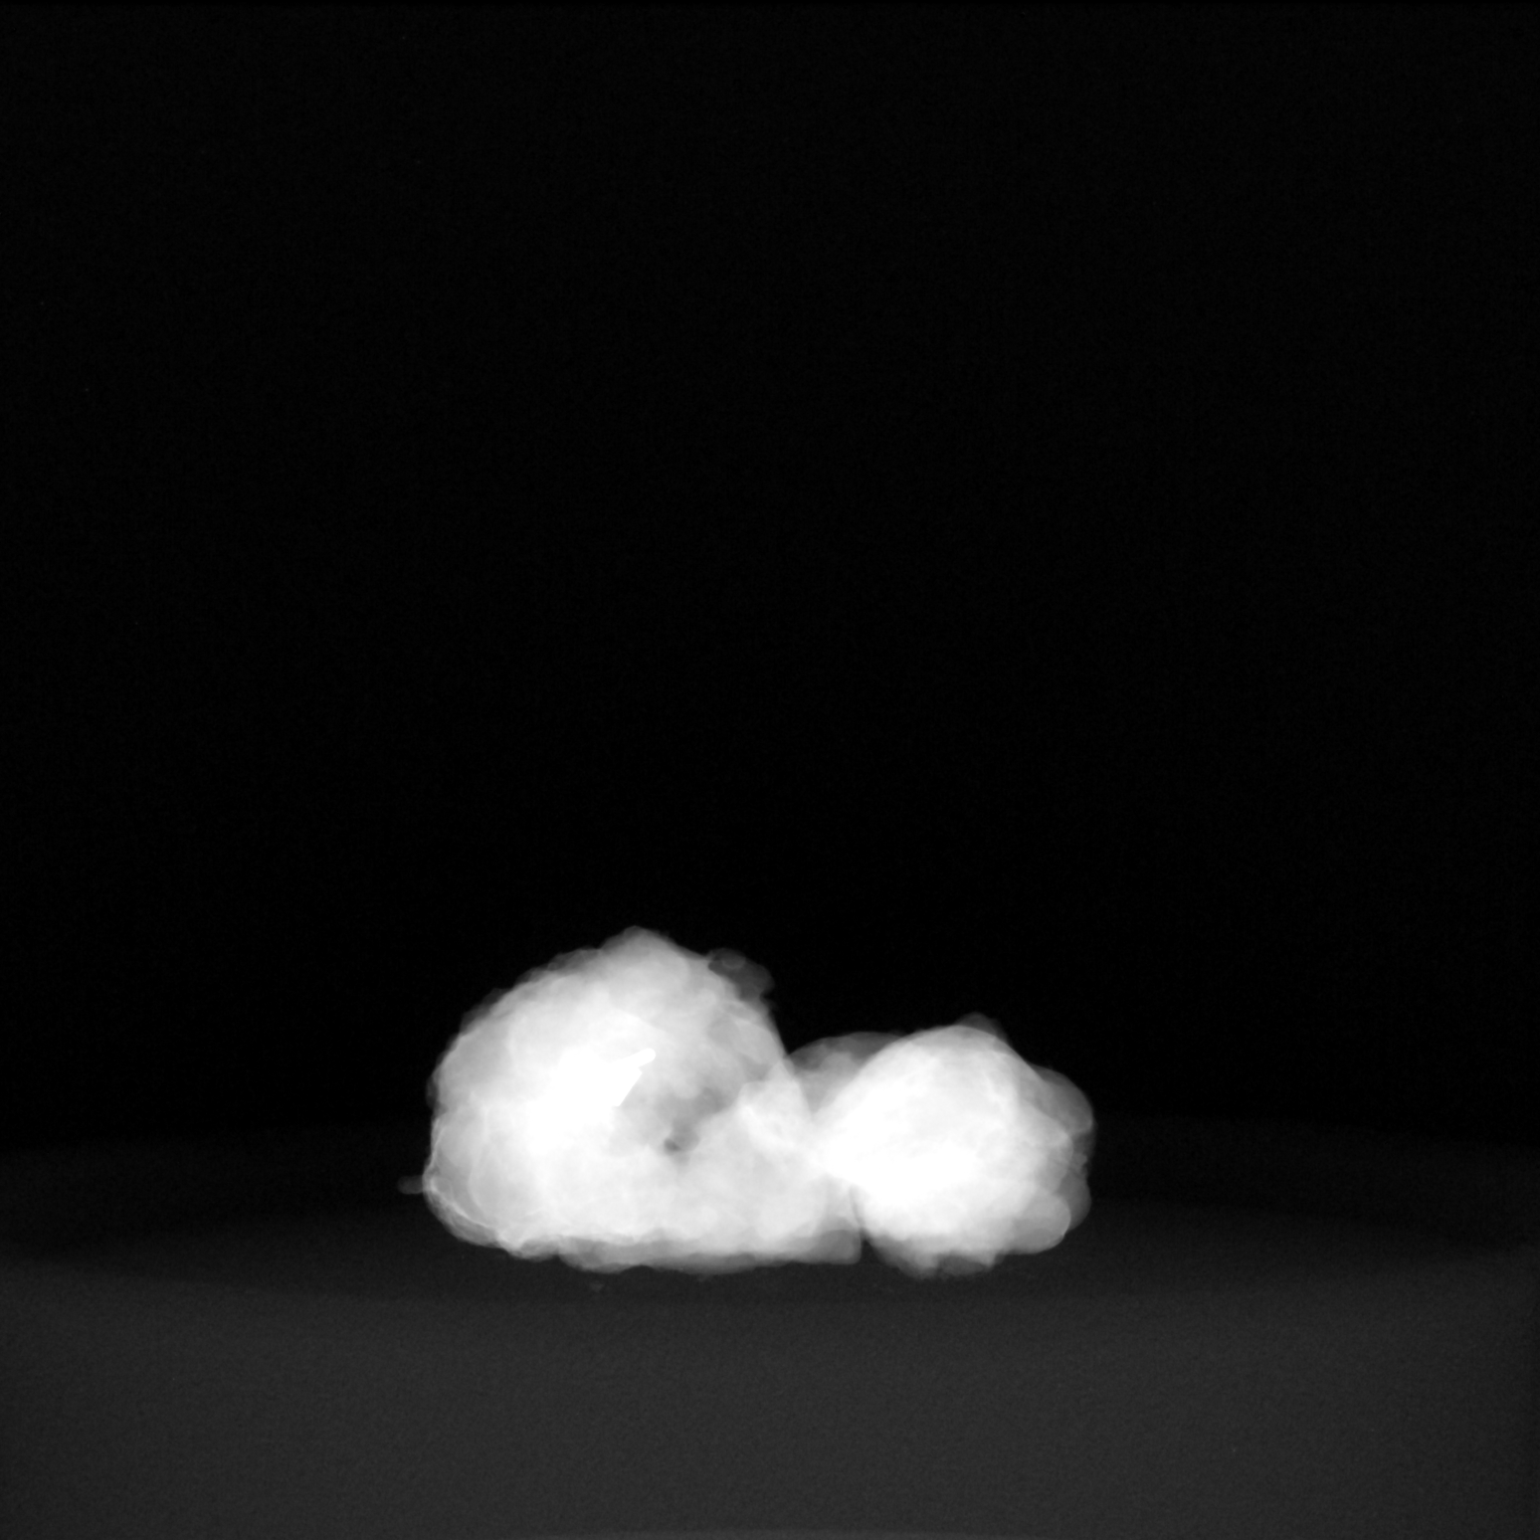
[im 2/2]
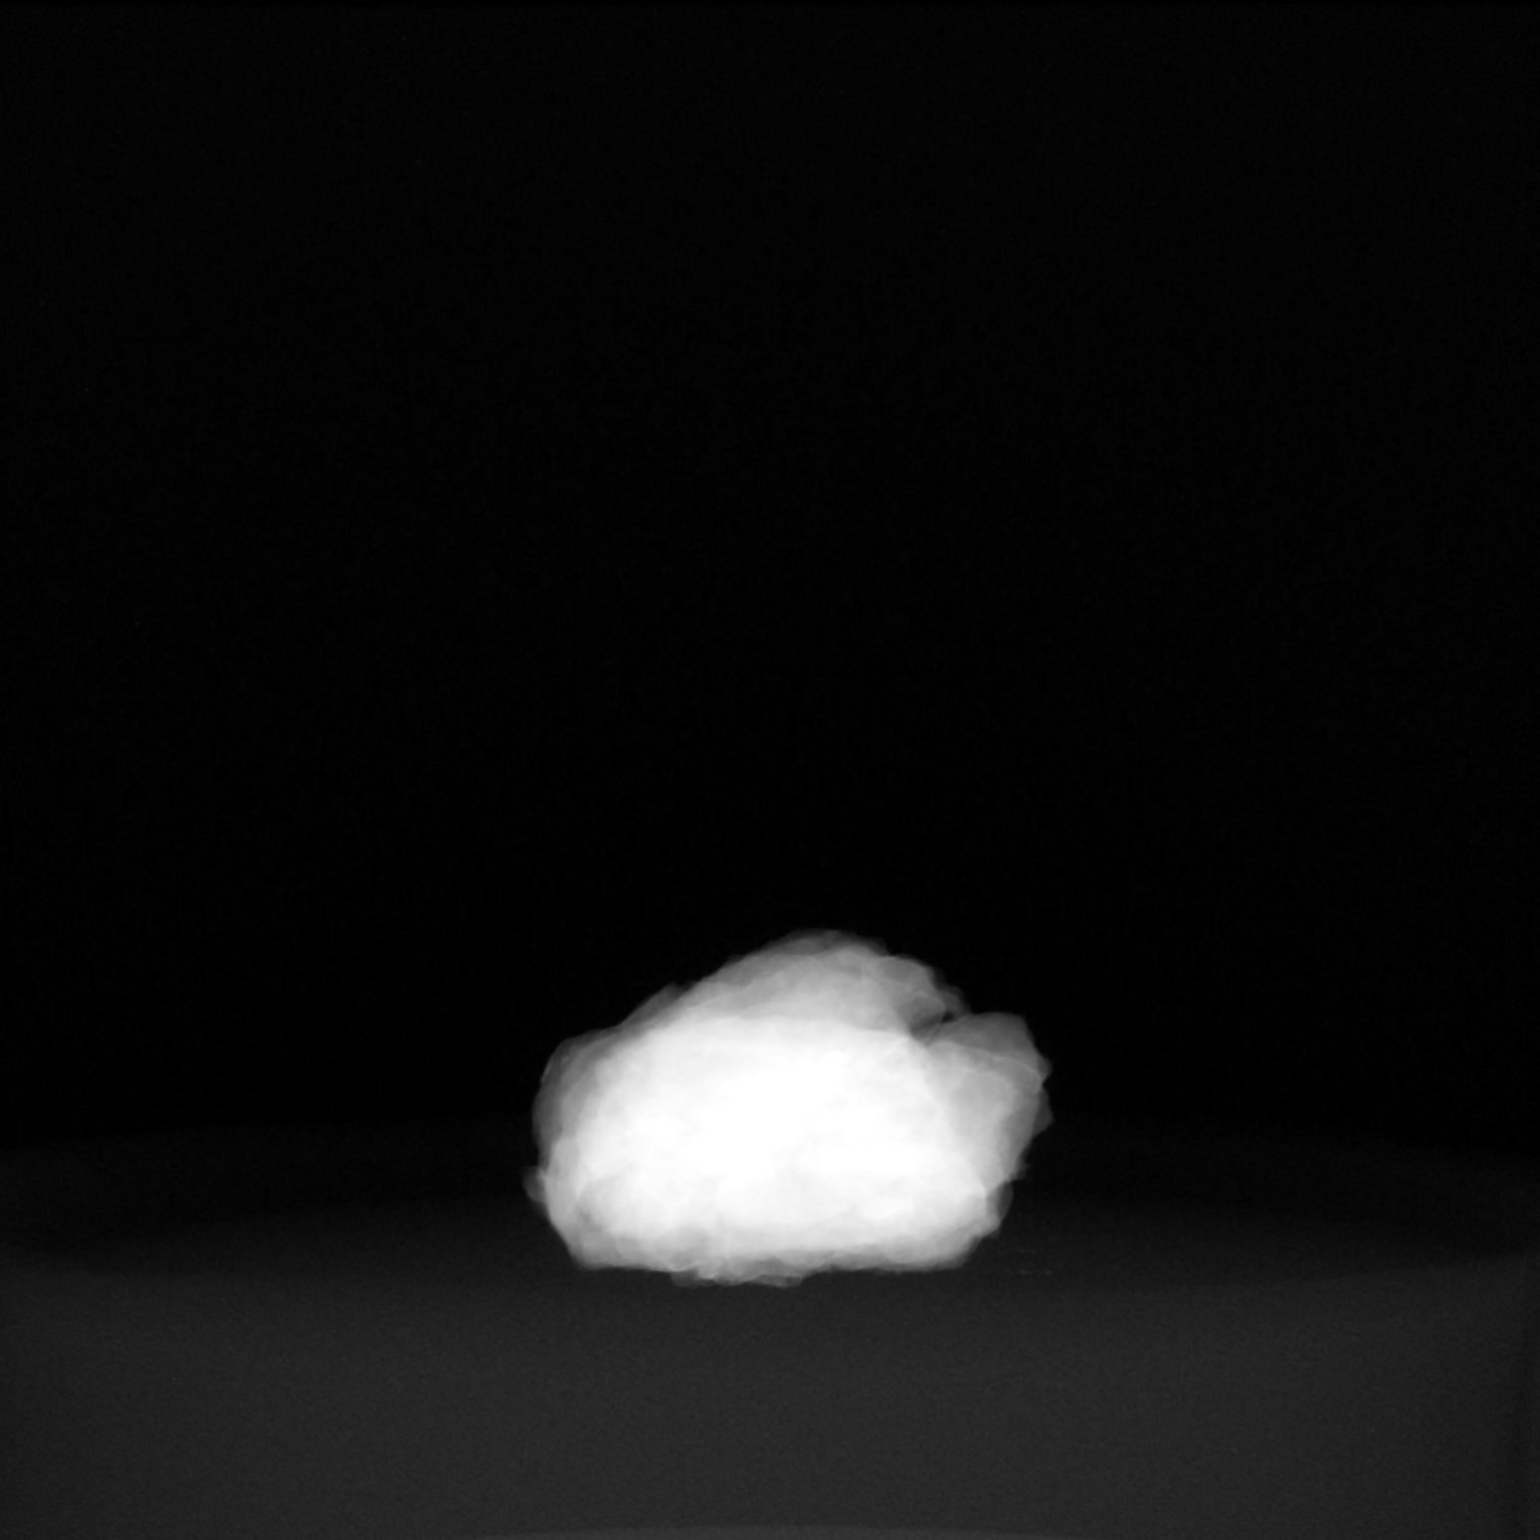

[2 of 2 positions shown; findings below may reference images not displayed]

FINDINGS: Status post excision of the right breast. The specimen from the
anterior aspect of the right breast demonstrates a dumbbell-shaped
biopsy marking clip and a radioactive seed which are completely
intact. The specimen from the posterior right breast demonstrates a
cylinder shaped biopsy marking clip and a radioactive seed, which
are completely intact. These findings were communicated with the OR
at [DATE] a.m.
IMPRESSION: Two specimen radiographs of the right breast.

## 2021-01-06 SURGERY — BREAST LUMPECTOMY WITH RADIOACTIVE SEED LOCALIZATION
Anesthesia: General | Site: Breast | Laterality: Right

## 2021-01-06 MED ORDER — FENTANYL CITRATE (PF) 100 MCG/2ML IJ SOLN: 25.0000 ug | INTRAMUSCULAR | Status: DC | PRN

## 2021-01-06 MED ORDER — ACETAMINOPHEN 500 MG PO TABS: 1000.0000 mg | ORAL_TABLET | Freq: Once | ORAL | Status: AC

## 2021-01-06 MED ORDER — CEFAZOLIN SODIUM-DEXTROSE 2-4 GM/100ML-% IV SOLN
INTRAVENOUS | Status: AC
  Filled 2021-01-06: qty 100

## 2021-01-06 MED ORDER — EPHEDRINE SULFATE 50 MG/ML IJ SOLN
INTRAMUSCULAR | Status: DC | PRN
  Administered 2021-01-06: 15 mg via INTRAVENOUS
  Administered 2021-01-06: 10 mg via INTRAVENOUS
  Administered 2021-01-06: 5 mg via INTRAVENOUS

## 2021-01-06 MED ORDER — SODIUM CHLORIDE 0.9 % IV SOLN
INTRAVENOUS | Status: AC
  Filled 2021-01-06: qty 10

## 2021-01-06 MED ORDER — PROMETHAZINE HCL 25 MG/ML IJ SOLN: 6.2500 mg | INTRAMUSCULAR | Status: DC | PRN

## 2021-01-06 MED ORDER — OXYCODONE HCL 5 MG PO TABS: 5.0000 mg | ORAL_TABLET | Freq: Once | ORAL | Status: DC | PRN

## 2021-01-06 MED ORDER — VANCOMYCIN HCL 500 MG IV SOLR
INTRAVENOUS | Status: AC
  Filled 2021-01-06: qty 10

## 2021-01-06 MED ORDER — LIDOCAINE HCL (CARDIAC) PF 100 MG/5ML IV SOSY
PREFILLED_SYRINGE | INTRAVENOUS | Status: DC | PRN
  Administered 2021-01-06: 80 mg via INTRAVENOUS

## 2021-01-06 MED ORDER — DEXAMETHASONE SODIUM PHOSPHATE 10 MG/ML IJ SOLN
INTRAMUSCULAR | Status: DC | PRN
  Administered 2021-01-06: 5 mg via INTRAVENOUS

## 2021-01-06 MED ORDER — ACETAMINOPHEN 500 MG PO TABS
1000.0000 mg | ORAL_TABLET | ORAL | Status: AC
  Administered 2021-01-06: 1000 mg via ORAL

## 2021-01-06 MED ORDER — BUPIVACAINE-EPINEPHRINE (PF) 0.25% -1:200000 IJ SOLN
INTRAMUSCULAR | Status: AC
  Filled 2021-01-06: qty 30

## 2021-01-06 MED ORDER — ONDANSETRON HCL 4 MG/2ML IJ SOLN
INTRAMUSCULAR | Status: DC | PRN
  Administered 2021-01-06: 4 mg via INTRAVENOUS

## 2021-01-06 MED ORDER — SODIUM CHLORIDE 0.9 % IV SOLN
INTRAVENOUS | Status: DC | PRN
  Administered 2021-01-06: 500 mL

## 2021-01-06 MED ORDER — MIDAZOLAM HCL 2 MG/2ML IJ SOLN
INTRAMUSCULAR | Status: DC | PRN
  Administered 2021-01-06: 2 mg via INTRAVENOUS

## 2021-01-06 MED ORDER — CEFAZOLIN SODIUM-DEXTROSE 2-4 GM/100ML-% IV SOLN
2.0000 g | INTRAVENOUS | Status: AC
  Administered 2021-01-06: 2 g via INTRAVENOUS

## 2021-01-06 MED ORDER — MIDAZOLAM HCL 2 MG/2ML IJ SOLN
INTRAMUSCULAR | Status: AC
  Filled 2021-01-06: qty 2

## 2021-01-06 MED ORDER — HYDROCODONE-ACETAMINOPHEN 5-325 MG PO TABS: 1.0000 | ORAL_TABLET | Freq: Four times a day (QID) | ORAL | 0 refills | Status: DC | PRN

## 2021-01-06 MED ORDER — VANCOMYCIN HCL 500 MG IV SOLR
INTRAVENOUS | Status: DC | PRN
  Administered 2021-01-06: 500 mg via TOPICAL

## 2021-01-06 MED ORDER — FENTANYL CITRATE (PF) 100 MCG/2ML IJ SOLN
INTRAMUSCULAR | Status: DC | PRN
  Administered 2021-01-06 (×2): 25 ug via INTRAVENOUS
  Administered 2021-01-06: 50 ug via INTRAVENOUS

## 2021-01-06 MED ORDER — OXYCODONE HCL 5 MG/5ML PO SOLN: 5.0000 mg | Freq: Once | ORAL | Status: DC | PRN

## 2021-01-06 MED ORDER — FENTANYL CITRATE (PF) 100 MCG/2ML IJ SOLN
INTRAMUSCULAR | Status: AC
  Filled 2021-01-06: qty 2

## 2021-01-06 MED ORDER — CHLORHEXIDINE GLUCONATE CLOTH 2 % EX PADS: 6.0000 | MEDICATED_PAD | Freq: Once | CUTANEOUS | Status: DC

## 2021-01-06 MED ORDER — BUPIVACAINE HCL (PF) 0.25 % IJ SOLN
INTRAMUSCULAR | Status: AC
  Filled 2021-01-06: qty 30

## 2021-01-06 MED ORDER — ACETAMINOPHEN 500 MG PO TABS
ORAL_TABLET | ORAL | Status: AC
  Filled 2021-01-06: qty 2

## 2021-01-06 MED ORDER — BUPIVACAINE-EPINEPHRINE (PF) 0.25% -1:200000 IJ SOLN
INTRAMUSCULAR | Status: DC | PRN
  Administered 2021-01-06: 20 mL

## 2021-01-06 MED ORDER — LIDOCAINE 2% (20 MG/ML) 5 ML SYRINGE
INTRAMUSCULAR | Status: AC
  Filled 2021-01-06: qty 5

## 2021-01-06 MED ORDER — DEXAMETHASONE SODIUM PHOSPHATE 10 MG/ML IJ SOLN
INTRAMUSCULAR | Status: AC
  Filled 2021-01-06: qty 1

## 2021-01-06 MED ORDER — PROPOFOL 10 MG/ML IV BOLUS
INTRAVENOUS | Status: DC | PRN
  Administered 2021-01-06: 150 mg via INTRAVENOUS

## 2021-01-06 MED ORDER — PROPOFOL 10 MG/ML IV BOLUS
INTRAVENOUS | Status: AC
  Filled 2021-01-06: qty 20

## 2021-01-06 MED ORDER — CEFAZOLIN SODIUM-DEXTROSE 2-3 GM-%(50ML) IV SOLR: INTRAVENOUS | Status: DC | PRN

## 2021-01-06 MED ORDER — LACTATED RINGERS IV SOLN: INTRAVENOUS | Status: DC

## 2021-01-06 MED ORDER — ONDANSETRON HCL 4 MG/2ML IJ SOLN
INTRAMUSCULAR | Status: AC
  Filled 2021-01-06: qty 2

## 2021-01-06 SURGICAL SUPPLY — 51 items
ADH SKN CLS APL DERMABOND .7 (GAUZE/BANDAGES/DRESSINGS) ×1
APPLIER CLIP 9.375 MED OPEN (MISCELLANEOUS)
APR CLP MED 9.3 20 MLT OPN (MISCELLANEOUS)
BINDER BREAST LRG (GAUZE/BANDAGES/DRESSINGS) ×2 IMPLANT
BINDER BREAST MEDIUM (GAUZE/BANDAGES/DRESSINGS) IMPLANT
BINDER BREAST XLRG (GAUZE/BANDAGES/DRESSINGS) IMPLANT
BINDER BREAST XXLRG (GAUZE/BANDAGES/DRESSINGS) IMPLANT
BLADE SURG 15 STRL LF DISP TIS (BLADE) ×1 IMPLANT
BLADE SURG 15 STRL SS (BLADE) ×2
CANISTER SUC SOCK COL 7IN (MISCELLANEOUS) IMPLANT
CANISTER SUCT 1200ML W/VALVE (MISCELLANEOUS) IMPLANT
CHLORAPREP W/TINT 26 (MISCELLANEOUS) ×2 IMPLANT
CLIP APPLIE 9.375 MED OPEN (MISCELLANEOUS) IMPLANT
COVER BACK TABLE 60X90IN (DRAPES) ×2 IMPLANT
COVER MAYO STAND STRL (DRAPES) ×2 IMPLANT
COVER PROBE W GEL 5X96 (DRAPES) ×2 IMPLANT
DECANTER SPIKE VIAL GLASS SM (MISCELLANEOUS) IMPLANT
DERMABOND ADVANCED (GAUZE/BANDAGES/DRESSINGS) ×1
DERMABOND ADVANCED .7 DNX12 (GAUZE/BANDAGES/DRESSINGS) ×1 IMPLANT
DRAPE LAPAROSCOPIC ABDOMINAL (DRAPES) IMPLANT
DRAPE LAPAROTOMY 100X72 PEDS (DRAPES) ×2 IMPLANT
DRAPE UTILITY XL STRL (DRAPES) ×2 IMPLANT
ELECT COATED BLADE 2.86 ST (ELECTRODE) ×2 IMPLANT
ELECT REM PT RETURN 9FT ADLT (ELECTROSURGICAL) ×2
ELECTRODE REM PT RTRN 9FT ADLT (ELECTROSURGICAL) ×1 IMPLANT
GLOVE SRG 8 PF TXTR STRL LF DI (GLOVE) ×1 IMPLANT
GLOVE SURG LTX SZ8 (GLOVE) ×2 IMPLANT
GLOVE SURG POLYISO LF SZ7 (GLOVE) ×2 IMPLANT
GLOVE SURG UNDER POLY LF SZ7 (GLOVE) ×2 IMPLANT
GLOVE SURG UNDER POLY LF SZ8 (GLOVE) ×2
GOWN STRL REUS W/ TWL LRG LVL3 (GOWN DISPOSABLE) ×2 IMPLANT
GOWN STRL REUS W/ TWL XL LVL3 (GOWN DISPOSABLE) ×1 IMPLANT
GOWN STRL REUS W/TWL LRG LVL3 (GOWN DISPOSABLE) ×4
GOWN STRL REUS W/TWL XL LVL3 (GOWN DISPOSABLE) ×2
HEMOSTAT ARISTA ABSORB 3G PWDR (HEMOSTASIS) IMPLANT
HEMOSTAT SNOW SURGICEL 2X4 (HEMOSTASIS) IMPLANT
KIT MARKER MARGIN INK (KITS) ×2 IMPLANT
NEEDLE HYPO 25X1 1.5 SAFETY (NEEDLE) ×2 IMPLANT
NS IRRIG 1000ML POUR BTL (IV SOLUTION) ×2 IMPLANT
PACK BASIN DAY SURGERY FS (CUSTOM PROCEDURE TRAY) ×2 IMPLANT
PENCIL SMOKE EVACUATOR (MISCELLANEOUS) ×2 IMPLANT
SLEEVE SCD COMPRESS KNEE MED (STOCKING) ×2 IMPLANT
SPONGE T-LAP 4X18 ~~LOC~~+RFID (SPONGE) ×4 IMPLANT
SUT MNCRL AB 4-0 PS2 18 (SUTURE) ×2 IMPLANT
SUT SILK 2 0 SH (SUTURE) IMPLANT
SUT VICRYL 3-0 CR8 SH (SUTURE) ×2 IMPLANT
SYR CONTROL 10ML LL (SYRINGE) ×2 IMPLANT
TOWEL GREEN STERILE FF (TOWEL DISPOSABLE) ×2 IMPLANT
TRAY FAXITRON CT DISP (TRAY / TRAY PROCEDURE) ×4 IMPLANT
TUBE CONNECTING 20X1/4 (TUBING) IMPLANT
YANKAUER SUCT BULB TIP NO VENT (SUCTIONS) IMPLANT

## 2021-01-06 NOTE — Discharge Instructions (Addendum)
[facility] Office Phone Number [phone]  BREAST BIOPSY/ PARTIAL MASTECTOMY: POST OP INSTRUCTIONS  Always review your discharge instruction sheet given to you by the facility where your surgery was performed.  IF YOU HAVE DISABILITY OR FAMILY LEAVE FORMS, YOU MUST BRING THEM TO THE OFFICE FOR PROCESSING.  DO NOT GIVE THEM TO YOUR DOCTOR.  A prescription for pain medication may be given to you upon discharge.  Take your pain medication as prescribed, if needed.  If narcotic pain medicine is not needed, then you may take acetaminophen (Tylenol) or ibuprofen (Advil) as needed. Take your usually prescribed medications unless otherwise directed If you need a refill on your pain medication, please contact your pharmacy.  They will contact our office to request authorization.  Prescriptions will not be filled after 5pm or on week-ends. You should eat very light the first 24 hours after surgery, such as soup, crackers, pudding, etc.  Resume your normal diet the day after surgery. Most patients will experience some swelling and bruising in the breast.  Ice packs and a good support bra will help.  Swelling and bruising can take several days to resolve.  It is common to experience some constipation if taking pain medication after surgery.  Increasing fluid intake and taking a stool softener will usually help or prevent this problem from occurring.  A mild laxative (Milk of Magnesia or Miralax) should be taken according to package directions if there are no bowel movements after 48 hours. Unless discharge instructions indicate otherwise, you may remove your bandages 24-48 hours after surgery, and you may shower at that time.  You may have steri-strips (small skin tapes) in place directly over the incision.  These strips should be left on the skin for 7-10 days.  If your surgeon used skin glue on the incision, you may shower in 24 hours.  The glue will flake off over the next 2-3 weeks.  Any  sutures or staples will be removed at the office during your follow-up visit. ACTIVITIES:  You may resume regular daily activities (gradually increasing) beginning the next day.  Wearing a good support bra or sports bra minimizes pain and swelling.  You may have sexual intercourse when it is comfortable. You may drive when you no longer are taking prescription pain medication, you can comfortably wear a seatbelt, and you can safely maneuver your car and apply brakes. RETURN TO WORK:  ______________________________________________________________________________________ Laura Shields should see your doctor in the office for a follow-up appointment approximately two weeks after your surgery.  Your doctor's nurse will typically make your follow-up appointment when she calls you with your pathology report.  Expect your pathology report 2-3 business days after your surgery.  You may call to check if you do not hear from [place] after three days. OTHER INSTRUCTIONS: _______________________________________________________________________________________________ _____________________________________________________________________________________________________________________________________ _____________________________________________________________________________________________________________________________________ _____________________________________________________________________________________________________________________________________  WHEN TO CALL YOUR DOCTOR: Fever over 101.0 Nausea and/or vomiting. Extreme swelling or bruising. Continued bleeding from incision. Increased pain, redness, or drainage from the incision.  The clinic staff is available to answer your questions during regular business hours.  Please don't hesitate to call and ask to speak to one of the nurses for clinical concerns.  If you have a medical emergency, go to the nearest emergency room or call 911.  A surgeon from [facility] is always on call at the hospital.  For further questions, please visit [email or web address]    Next dose of Tylenol at home after 12:45pm as needed for pain.   Post  Anesthesia Home Care Instructions  Activity: Get plenty of rest for the remainder of the day. A responsible individual must stay with you for 24 hours following the procedure.  For the next 24 hours, DO NOT: -Drive a car -Paediatric nurse -Drink alcoholic beverages -Take any medication unless instructed by your physician -Make any legal decisions or sign important papers.  Meals: Start with liquid foods such as gelatin or soup. Progress to regular foods as tolerated. Avoid greasy, spicy, heavy foods. If nausea and/or vomiting occur, drink only clear liquids until the nausea and/or vomiting subsides. Call your physician if vomiting continues.  Special Instructions/Symptoms: Your throat may feel dry or sore from the anesthesia or the breathing tube placed in your throat during surgery. If this causes discomfort, gargle with warm salt water. The discomfort should disappear within 24 hours.  If you had a scopolamine patch placed behind your ear for the management of post- operative nausea and/or vomiting:  1. The medication in the patch is effective for 72 hours, after which it should be removed.  Wrap patch in a tissue and discard in the trash. Wash hands thoroughly with soap and water. 2. You may remove the patch earlier than 72 hours if you experience unpleasant side effects which may include dry mouth, dizziness or visual disturbances. 3. Avoid touching the patch. Wash your hands with soap and water after contact with the patch.

## 2021-01-06 NOTE — Anesthesia Procedure Notes (Signed)
Procedure Name: LMA Insertion Date/Time: 01/06/2021 7:40 AM Performed by: Karen Kitchens, CRNA Pre-anesthesia Checklist: Patient identified, Emergency Drugs available, Suction available and Patient being monitored Patient Re-evaluated:Patient Re-evaluated prior to induction Oxygen Delivery Method: Circle system utilized Preoxygenation: Pre-oxygenation with 100% oxygen Induction Type: IV induction Ventilation: Mask ventilation without difficulty LMA: LMA inserted LMA Size: 4.0 Number of attempts: 1 Airway Equipment and Method: Bite block Placement Confirmation: positive ETCO2, breath sounds checked- equal and bilateral and CO2 detector Tube secured with: Tape Dental Injury: Teeth and Oropharynx as per pre-operative assessment

## 2021-01-06 NOTE — Transfer of Care (Signed)
Immediate Anesthesia Transfer of Care Note  Patient: Laura Shields  Procedure(s) Performed: RIGHT BREAST LUMPECTOMY WITH RADIOACTIVE SEED LOCALIZATION X 2 (Right: Breast)  Patient Location: PACU  Anesthesia Type:General  Level of Consciousness: awake, alert  and oriented  Airway & Oxygen Therapy: Patient Spontanous Breathing and Patient connected to face mask oxygen  Post-op Assessment: Report given to RN and Post -op Vital signs reviewed and stable  Post vital signs: Reviewed and stable  Last Vitals:  Vitals Value Taken Time  BP    Temp    Pulse 78 01/06/21 0833  Resp 13 01/06/21 0833  SpO2 100 % 01/06/21 0833  Vitals shown include unvalidated device data.  Last Pain:  Vitals:   01/06/21 0631  TempSrc: Oral  PainSc: 0-No pain      Patients Stated Pain Goal: 2 (01/06/21 0631)  Complications: No notable events documented.

## 2021-01-06 NOTE — Interval H&P Note (Signed)
History and Physical Interval Note:  01/06/2021 7:16 AM  Laura Shields  has presented today for surgery, with the diagnosis of RADIAL SCAR RIGHT BREAST X2.  The various methods of treatment have been discussed with the patient and family. After consideration of risks, benefits and other options for treatment, the patient has consented to  Procedure(s): RIGHT BREAST LUMPECTOMY WITH RADIOACTIVE SEED LOCALIZATION X2 (Right) as a surgical intervention.  The patient's history has been reviewed, patient examined, no change in status, stable for surgery.  I have reviewed the patient's chart and labs.  Questions were answered to the patient's satisfaction.   The procedure has been discussed with the patient. Alternatives to surgery have been discussed with the patient.  Risks of surgery include bleeding,  Infection,  Seroma formation, death,  and the need for further surgery.   The patient understands and wishes to proceed.   Sher Shampine A Elio Haden

## 2021-01-06 NOTE — Op Note (Signed)
Preoperative diagnosis: Right breast radial scar x2  Postoperative diagnosis: Same  Procedure: Right breast seed localized lumpectomy x2  Surgeon: Erroll Luna, MD  Anesthesia: LMA with 0.25% Marcaine plain  EBL: 10 cc  Drains: None  Specimens: Anterior breast lesion oriented with ink as well as a posterior breast lesion oriented with ink sent as separate specimens.  Gross margins were negative indications for procedure: The patient is a 65 year old female with a history of high risk breast cancer.  She is monitored with MRI and actually had a recent MRI which showed 2 lesions on the right side.  Core biopsy showed radial scar.  Excision recommended due to potential upgrade risk as well as her elevated lifetime risk of breast cancer being over 20%.  Risks and benefits of lumpectomy discussed with the patient.  She agreed to proceed.The procedure has been discussed with the patient. Alternatives to surgery have been discussed with the patient.  Risks of surgery include bleeding,  Infection,  Seroma formation, death,  and the need for further surgery.   The patient understands and wishes to proceed.    Description of procedure: The patient was met in the holding area and questions answered.  Films were available for review and the right breast was marked as the correct site.  She was taken back to the operating room.  After induction of general anesthesia, right breast was prepped and draped in a sterile fashion timeout performed.  Proper patient, site and procedure were verified.  She received appropriate preoperative antibiotics.  Neoprobe used and both seeds were in the central to upper breast.  Curvilinear incision was made along the superior border of the nipple-areolar complex.  Dissection carried down the anterior lesion was removed with grossly negative margins.  The Faxitron revealed the seed and clip to be present.  This was oriented with ink and sent to pathology.  The deep lesion was  then tolerated.  This was in the exact same mid midline position.  But is more posterior.  This was excised in its entirety.  It was oriented with ink.  The Faxitron image revealed the seed and clip to be present.  Hemostasis achieved with cautery.  Irrigation used.  Local anesthetic infiltrated around the lumpectomy cavity.  Vancomycin powder placed.  Wound then closed with a deep layer of 3-0 Vicryl.  4 Monocryl used to close the skin.  Hemostasis was complete.  Dermabond applied.  Breast binder placed.  All counts were found to be correct.  The patient was awoke extubated taken to recovery in satisfactory condition.

## 2021-01-06 NOTE — H&P (Signed)
History of Present Illness: Laura Shields is a 65 y.o. female who is seen today as an office consultation at the request of Dr. Marcelle Overlie for evaluation of No chief complaint on file. .   Patient underwent MRI due to high risk state which showed 3 areas 2 on the right 1 left. The 2 on the right were radial scars while adenosis was found in the left. She also has a lifetime risk of 23% of breast cancer based on the TC model and has negative genetics. She is here today to discuss lumpectomy for the radial scar is but also to discuss high risk reduction strategies.  Review of Systems: A complete review of systems was obtained from the patient. I have reviewed this information and discussed as appropriate with the patient. See HPI as well for other ROS.    Medical History: Past Medical History:  Diagnosis Date   GERD (gastroesophageal reflux disease)   Hyperlipidemia   Sleep apnea   There is no problem list on file for this patient.  Past Surgical History:  Procedure Laterality Date   arthroscopy of knee   BREAST EXCISIONAL BIOPSY   ENDOMETRIAL ABLATION W/ NOVASURE   tubligation    No Known Allergies  Current Outpatient Medications on File Prior to Visit  Medication Sig Dispense Refill   ascorbic acid, vitamin C, (VITAMIN C) 500 MG tablet daily   buPROPion (WELLBUTRIN XL) 150 MG XL tablet   cholecalciferol (VITAMIN D3) 2,000 unit capsule daily   glucosamine sulfate 500 mg Tab daily   naproxen (NAPROSYN) 500 MG tablet   omeprazole (PRILOSEC) 20 MG DR capsule   rosuvastatin (CRESTOR) 20 MG tablet   No current facility-administered medications on file prior to visit.   Family History  Problem Relation Age of Onset   Hyperlipidemia (Elevated cholesterol) Mother   Colon cancer Mother   Breast cancer Mother   Skin cancer Father   Hyperlipidemia (Elevated cholesterol) Father    Social History   Tobacco Use  Smoking Status Never Smoker  Smokeless Tobacco Not on file     Social History   Socioeconomic History   Marital status: Married  Tobacco Use   Smoking status: Never Smoker  Vaping Use   Vaping Use: Never used  Substance and Sexual Activity   Alcohol use: Never   Drug use: Never   Objective:   Vitals:  12/08/20 0929  BP: 122/64  Pulse: 89  Temp: 36.8 C (98.2 F)  SpO2: 94%  Weight: 74.8 kg (164 lb 12.8 oz)  Height: 167.6 cm (5\' 6" )   Body mass index is 26.6 kg/m.  Physical Exam Constitutional:  Appearance: Normal appearance.  HENT:  Head: Normocephalic.  Eyes:  General: No scleral icterus. Pupils: Pupils are equal, round, and reactive to light.  Cardiovascular:  Rate and Rhythm: Normal rate.  Pulmonary:  Effort: Pulmonary effort is normal.  Breath sounds: No stridor.  Chest:  Breasts:  Right: Normal. No mass.  Left: Normal. No mass.   Comments: Breasts are a C/D density Musculoskeletal:  General: Normal range of motion.  Cervical back: Normal range of motion.  Lymphadenopathy:  Upper Body:  Right upper body: No supraclavicular or axillary adenopathy.  Left upper body: No supraclavicular or axillary adenopathy.  Skin: General: Skin is warm and dry.  Neurological:  General: No focal deficit present.  Mental Status: She is alert.  Psychiatric:  Mood and Affect: Mood normal.  Behavior: Behavior normal.     Labs, Imaging and Diagnostic Testing:  Diagnosis 1. Breast, left, needle core biopsy, lateral central, cylinder clip - ADENOSIS AND FIBROCYSTIC CHANGES - NO MALIGNANCY IDENTIFIED 2. Breast, right, needle core biopsy, posterior lower - COMPLEX SCLEROSING LESION WITH USUAL DUCTAL HYPERPLASIA Microscopic Comment 1. These results were called to [facility] on December 02, 2020. Manning Charity MD Pathologist, Electronic Signature (Case signed 12/02/2020) S Diagnosis Breast, right, needle core biopsy, UOQ anterior - COMPLEX SCLEROSING LESION WITH USUAL DUCTAL HYPERPLASIA AND APOCRINE  METAPLASIA. SEE NOTE - FIBROCYSTIC CHANGE WITH USUAL DUCTAL HYPERPLASIA AND APOCRINE METAPLASIA - NEGATIVE FOR CARCINOMA Diagnosis Note Dr. Luisa Hart reviewed the case and concurs with the diagnosis. [facility] was notified on 11/04/2020. Holley Bouche MD Pathologist, Electronic Signature (Case signed 11/04/2020) CLINICAL DATA: Family history of breast cancer.   LABS: Not performed at imaging site.   EXAM: BILATERAL BREAST MRI WITH AND WITHOUT CONTRAST   TECHNIQUE: Multiplanar, multisequence MR images of both breasts were obtained prior to and following the intravenous administration of 7 ml of Gadavist   Three-dimensional MR images were rendered by post-processing of the original MR data on an independent workstation. The three-dimensional MR images were interpreted, and findings are reported in the following complete MRI report for this study. Three dimensional images were evaluated at the independent interpreting workstation using the DynaCAD thin client.   COMPARISON: No previous breast MRI.   FINDINGS: Breast composition: b. Scattered fibroglandular tissue.   Background parenchymal enhancement: Mild   Right breast: Irregular enhancing mass within the upper-outer quadrant of the RIGHT breast, at anterior depth, measuring 8 mm, with progressive enhancement kinetics (series 9, image 74).   Additional scattered benign-appearing enhancing foci within the lower RIGHT breast, at middle and posterior depth.   Left breast: Scattered benign-appearing enhancing foci with central and outer LEFT breast.   No suspicious enhancing mass, suspicious non-mass or secondary signs of malignancy within the LEFT breast.   Lymph nodes: No abnormal appearing lymph nodes.   Ancillary findings: None.   IMPRESSION: 1. Irregular enhancing mass within the upper-outer quadrant of the RIGHT breast, at anterior depth, measuring 8 mm (series 9, image 74). This is  a suspicious finding for which MRI-guided biopsy is recommended. 2. Additional scattered benign-appearing enhancing foci within each breast.   RECOMMENDATION: 1. MRI-guided biopsy of the irregular enhancing mass within the upper-outer quadrant of the RIGHT breast, at anterior depth, measuring 8 mm. 2. If the MRI-guided biopsy provides a benign and concordant pathology result, recommend six-month follow-up breast MRI per protocol with attention to the additional scattered benign-appearing foci within each breast to ensure stability. 3. If the MRI-guided biopsy provides a neoplastic result or a high risk breast lesion requiring surgical excision, would then recommend additional MRI-guided biopsies for the dominant enhancing foci within the posterior RIGHT breast and outer LEFT breast.   BI-RADS CATEGORY 4: Suspicious.     Electronically Signed By: Bary Richard M.D. On: 10/21/2020 14:20  Assessment and Plan:  Diagnoses and all orders for this visit:  Radial scar of breast Comments: right x 2   Breast cancer screening, high risk patient  Discussed that yearly MRI with mammograms  Discussed use of medication for risk reduction and surgery  Discussed high risk state. She has genetic testing which is negative. She had 4 first-degree relatives with breast cancer though. Her lifetime risk is about 23% by the TC model. I explained this may overestimate slightly but she is still above 20% which makes her high risk. Discussed  high risk reduction strategies of diet modification, exercise, avoidance of excessive alcohol or tobacco, use of tamoxifen and other hormone receptor blocking agents to reduce risk in surgery. I recommended right breast seed lumpectomy x2 with a radial scar as since there is a small but real risk of malignancy. Risks and benefits of surgery discussed. Use of seed discussed. The procedure has been discussed with the patient. Alternatives to surgery have been discussed  with the patient. Risks of surgery include bleeding, Infection, Seroma formation, death, and the need for further surgery. The patient understands and wishes to proceed.  No follow-ups on file.  Hayden Rasmussen, MD

## 2021-01-06 NOTE — Anesthesia Postprocedure Evaluation (Signed)
Anesthesia Post Note  Patient: Laura Shields  Procedure(s) Performed: RIGHT BREAST LUMPECTOMY WITH RADIOACTIVE SEED LOCALIZATION X 2 (Right: Breast)     Patient location during evaluation: PACU Anesthesia Type: General Level of consciousness: awake and alert and oriented Pain management: pain level controlled Vital Signs Assessment: post-procedure vital signs reviewed and stable Respiratory status: spontaneous breathing, nonlabored ventilation and respiratory function stable Cardiovascular status: blood pressure returned to baseline Postop Assessment: no apparent nausea or vomiting Anesthetic complications: no   No notable events documented.  Last Vitals:  Vitals:   01/06/21 0900 01/06/21 0915  BP: 135/65 132/69  Pulse: 72 78  Resp: 10 16  Temp:    SpO2: 98% 100%    Last Pain:  Vitals:   01/06/21 0900  TempSrc:   PainSc: 0-No pain                 Shanda Howells

## 2021-01-07 ENCOUNTER — Encounter (HOSPITAL_BASED_OUTPATIENT_CLINIC_OR_DEPARTMENT_OTHER): Payer: Self-pay | Admitting: Surgery

## 2021-01-09 LAB — SURGICAL PATHOLOGY

## 2021-01-12 ENCOUNTER — Encounter: Payer: Self-pay | Admitting: Surgery

## 2021-02-18 ENCOUNTER — Other Ambulatory Visit: Payer: Self-pay | Admitting: Surgery

## 2021-02-18 DIAGNOSIS — Z9889 Other specified postprocedural states: Secondary | ICD-10-CM

## 2021-04-06 ENCOUNTER — Ambulatory Visit
Admission: RE | Admit: 2021-04-06 | Discharge: 2021-04-06 | Disposition: A | Discharge status: Home / Self Care | Payer: BC Managed Care – PPO | Source: Ambulatory Visit | Attending: Surgery | Admitting: Surgery

## 2021-04-06 DIAGNOSIS — Z9889 Other specified postprocedural states: Secondary | ICD-10-CM

## 2021-04-14 ENCOUNTER — Encounter (HOSPITAL_COMMUNITY): Payer: Self-pay

## 2021-06-15 ENCOUNTER — Other Ambulatory Visit: Payer: Self-pay | Admitting: Obstetrics and Gynecology

## 2021-06-15 DIAGNOSIS — Z803 Family history of malignant neoplasm of breast: Secondary | ICD-10-CM

## 2021-09-15 ENCOUNTER — Ambulatory Visit
Admission: RE | Admit: 2021-09-15 | Discharge: 2021-09-15 | Disposition: A | Discharge status: Home / Self Care | Payer: BC Managed Care – PPO | Source: Ambulatory Visit | Attending: Obstetrics and Gynecology | Admitting: Obstetrics and Gynecology

## 2021-09-15 DIAGNOSIS — Z803 Family history of malignant neoplasm of breast: Secondary | ICD-10-CM

## 2021-09-15 MED ORDER — GADOBUTROL 1 MMOL/ML IV SOLN
8.0000 mL | Freq: Once | INTRAVENOUS | Status: AC | PRN
  Administered 2021-09-15: 8 mL via INTRAVENOUS

## 2021-09-17 ENCOUNTER — Other Ambulatory Visit: Payer: Self-pay | Admitting: Surgery

## 2021-09-17 DIAGNOSIS — Z1239 Encounter for other screening for malignant neoplasm of breast: Secondary | ICD-10-CM

## 2022-01-21 LAB — HM DEXA SCAN

## 2022-01-28 ENCOUNTER — Telehealth: Payer: Self-pay | Admitting: Family Medicine

## 2022-01-28 NOTE — Telephone Encounter (Signed)
That's fine

## 2022-01-28 NOTE — Telephone Encounter (Signed)
Pt came in office stating spouse sees provider Dr Carmelia Roller Marcia Brash) and pt would like to know if ok with Dr Carmelia Roller to be est as new pt. Please advise. Pt tel [phone].

## 2022-02-09 NOTE — Telephone Encounter (Signed)
Pt is schedule for January 2024. Done

## 2022-03-01 ENCOUNTER — Encounter: Payer: Self-pay | Admitting: Family Medicine

## 2022-03-02 ENCOUNTER — Other Ambulatory Visit: Payer: Self-pay | Admitting: Family Medicine

## 2022-03-02 DIAGNOSIS — Z1231 Encounter for screening mammogram for malignant neoplasm of breast: Secondary | ICD-10-CM

## 2022-03-03 ENCOUNTER — Other Ambulatory Visit: Payer: Self-pay | Admitting: Family Medicine

## 2022-03-03 ENCOUNTER — Ambulatory Visit: Payer: BC Managed Care – PPO | Admitting: Family Medicine

## 2022-03-03 ENCOUNTER — Encounter: Payer: Self-pay | Admitting: Family Medicine

## 2022-03-03 VITALS — BP 134/78 | HR 64 | Temp 97.8°F | Ht 66.0 in | Wt 169.1 lb

## 2022-03-03 DIAGNOSIS — Z1159 Encounter for screening for other viral diseases: Secondary | ICD-10-CM | POA: Diagnosis not present

## 2022-03-03 DIAGNOSIS — Z114 Encounter for screening for human immunodeficiency virus [HIV]: Secondary | ICD-10-CM

## 2022-03-03 DIAGNOSIS — K219 Gastro-esophageal reflux disease without esophagitis: Secondary | ICD-10-CM | POA: Diagnosis not present

## 2022-03-03 DIAGNOSIS — E782 Mixed hyperlipidemia: Secondary | ICD-10-CM | POA: Diagnosis not present

## 2022-03-03 DIAGNOSIS — F339 Major depressive disorder, recurrent, unspecified: Secondary | ICD-10-CM | POA: Diagnosis not present

## 2022-03-03 LAB — COMPREHENSIVE METABOLIC PANEL
ALT: 15 U/L (ref 0–35)
AST: 18 U/L (ref 0–37)
Albumin: 5.1 g/dL (ref 3.5–5.2)
Alkaline Phosphatase: 118 U/L — ABNORMAL HIGH (ref 39–117)
BUN: 13 mg/dL (ref 6–23)
CO2: 29 mEq/L (ref 19–32)
Calcium: 10.4 mg/dL (ref 8.4–10.5)
Chloride: 102 mEq/L (ref 96–112)
Creatinine, Ser: 0.88 mg/dL (ref 0.40–1.20)
GFR: 69.33 mL/min (ref >60.00)
Glucose, Bld: 92 mg/dL (ref 70–99)
Potassium: 4.5 mEq/L (ref 3.5–5.1)
Sodium: 140 mEq/L (ref 135–145)
Total Bilirubin: 0.5 mg/dL (ref 0.2–1.2)
Total Protein: 8.4 g/dL — ABNORMAL HIGH (ref 6.0–8.3)

## 2022-03-03 LAB — LIPID PANEL
Cholesterol: 150 mg/dL (ref 0–200)
HDL: 56.6 mg/dL (ref >39.00)
LDL Cholesterol: 60 mg/dL (ref 0–99)
NonHDL: 93.22
Total CHOL/HDL Ratio: 3
Triglycerides: 168 mg/dL — ABNORMAL HIGH (ref 0.0–149.0)
VLDL: 33.6 mg/dL (ref 0.0–40.0)

## 2022-03-03 NOTE — Patient Instructions (Addendum)
Try stopping the omeprazole. If symptoms return, consider Pepcid (famotidine) 20 mg daily as an alternative. Let me know if you are able to come off of it.   Keep the diet clean and stay active.  Foods that may reduce pain: 1) Ginger 2) Blueberries 3) Salmon 4) Pumpkin seeds 5) dark chocolate 6) turmeric 7) tart cherries 8) virgin olive oil 9) chilli peppers 10) mint 11) krill oil  Let us know if you need anything.

## 2022-03-03 NOTE — Progress Notes (Signed)
Chief Complaint  Patient presents with   New Patient (Initial Visit)       New Patient Visit SUBJECTIVE: HPI: Laura Shields is an 67 y.o.female who is being seen for establishing care.  The patient was previously seen at   Hyperlipidemia Patient presents for dyslipidemia follow up. Currently being treated with Crestor 40 mg/d and compliance with treatment thus far has been good. She denies myalgias. She is adhering to a healthy diet. Exercise: lifting wts, swimming, cardio The patient is not known to have coexisting coronary artery disease.  GERD Patient has a history of reflux.  She has been taking omeprazole 40 mg daily for the last several years.  She has never tried coming off of it.  Historically, any food she would eat tended to flare her symptoms.  No difficulty swallowing, unintentional weight loss, bleeding, or throwing up.  Depression Patient has a history of depression currently well-controlled on Wellbutrin XL 150 mg daily.  She reports compliance and no adverse effects.  No homicidal or suicidal ideation.  No self-medication.  She is not following with a therapist or counselor.  She has stressors with elderly parents and an alcoholic brother.  Past Medical History:  Diagnosis Date   Arthritis    bil knees   Atrial fibrillation (HCC)    Depression    GERD (gastroesophageal reflux disease)    Sleep apnea    +Bipap nightly   Past Surgical History:  Procedure Laterality Date   BREAST BIOPSY Right 11/03/2020   BREAST LUMPECTOMY WITH RADIOACTIVE SEED LOCALIZATION Right 01/06/2021   Procedure: RIGHT BREAST LUMPECTOMY WITH RADIOACTIVE SEED LOCALIZATION X 2;  Surgeon: Erroll Luna, MD;  Location: [employer] [facility];  Service: General;  Laterality: Right;   ENDOMETRIAL ABLATION     KNEE ARTHROSCOPY Right    TUBAL LIGATION     Family History  Problem Relation Age of Onset   Breast cancer Mother 38   No Known Allergies  Current Outpatient Medications:     azelastine (ASTELIN) 0.1 % nasal spray, Place into both nostrils 2 (two) times daily. Use in each nostril as directed, Disp: , Rfl:    buPROPion (WELLBUTRIN XL) 150 MG 24 hr tablet, Take 150 mg by mouth daily., Disp: , Rfl:    calcium carbonate (OSCAL) 1500 (600 Ca) MG TABS tablet, Take 600 mg of elemental calcium by mouth 2 (two) times daily with a meal., Disp: , Rfl:    cholecalciferol (VITAMIN D3) 25 MCG (1000 UNIT) tablet, Take 2,000 Units by mouth daily., Disp: , Rfl:    flecainide (TAMBOCOR) 100 MG tablet, Take 100 mg by mouth 2 (two) times daily., Disp: , Rfl:    glucosamine-chondroitin 500-400 MG tablet, Take 1 tablet by mouth 3 (three) times daily., Disp: , Rfl:    omeprazole (PRILOSEC) 40 MG capsule, Take 40 mg by mouth daily., Disp: , Rfl:    rosuvastatin (CRESTOR) 40 MG tablet, Take 40 mg by mouth daily., Disp: , Rfl:    triamcinolone cream (KENALOG) 0.1 %, Apply 1 Application topically 2 (two) times daily., Disp: , Rfl:   OBJECTIVE: BP 134/78 (BP Location: Left Arm, Cuff Size: Normal)   Pulse 64   Temp 97.8 F (36.6 C) (Oral)   Ht 5\' 6"  (1.676 m)   Wt 169 lb 2 oz (76.7 kg)   SpO2 99%   BMI 27.30 kg/m  General:  well developed, well nourished, in no apparent distress Throat/Pharynx:  lips and gingiva without lesion; tongue and uvula  midline; non-inflamed pharynx; no exudates or postnasal drainage Lungs:  clear to auscultation, breath sounds equal bilaterally, no respiratory distress Cardio:  regular rate and rhythm, no LE edema or bruits Abdomen: Bowel sounds present, soft, nontender, nondistended Neuro:  gait normal Psych: well oriented with normal range of affect and appropriate judgment/insight  ASSESSMENT/PLAN: Mixed hyperlipidemia - Plan: Comprehensive metabolic panel, Lipid panel  Gastroesophageal reflux disease, unspecified whether esophagitis present  Depression, recurrent ([place])  Encounter for hepatitis C screening test for low risk patient - Plan: Hepatitis C  antibody  Screening for HIV without presence of risk factors - Plan: HIV Antibody (routine testing w rflx)  Chronic, stable.  Follow-up on cholesterol after Crestor was increased to 40 mg daily.  Will currently continue this pending results and add a fibrate if triglycerides are still not controlled.  Counseled on diet and exercise. Chronic, stable.  We will have her wean off of omeprazole.  If symptoms return, consider Pepcid 20 mg daily.  If she is still having issues, we will resume omeprazole 40 mg daily. Chronic, stable.  Continue Wellbutrin XL 150 mg daily. Patient should return in 6 mo. The patient voiced understanding and agreement to the plan.   [place], DO 03/03/22  10:41 AM

## 2022-03-04 LAB — HEPATITIS C ANTIBODY: Hepatitis C Ab: NONREACTIVE

## 2022-03-04 LAB — HIV ANTIBODY (ROUTINE TESTING W REFLEX): HIV 1&2 Ab, 4th Generation: NONREACTIVE

## 2022-03-09 ENCOUNTER — Other Ambulatory Visit: Payer: Self-pay | Admitting: Family Medicine

## 2022-03-09 ENCOUNTER — Encounter: Payer: Self-pay | Admitting: Family Medicine

## 2022-03-09 MED ORDER — ONDANSETRON 4 MG PO TBDP: 4.0000 mg | ORAL_TABLET | Freq: Three times a day (TID) | ORAL | 0 refills | Status: DC | PRN

## 2022-03-17 ENCOUNTER — Other Ambulatory Visit (INDEPENDENT_AMBULATORY_CARE_PROVIDER_SITE_OTHER): Payer: BC Managed Care – PPO

## 2022-03-17 DIAGNOSIS — E782 Mixed hyperlipidemia: Secondary | ICD-10-CM | POA: Diagnosis not present

## 2022-03-17 LAB — HEPATIC FUNCTION PANEL
ALT: 14 U/L (ref 0–35)
AST: 18 U/L (ref 0–37)
Albumin: 4.7 g/dL (ref 3.5–5.2)
Alkaline Phosphatase: 123 U/L — ABNORMAL HIGH (ref 39–117)
Bilirubin, Direct: 0.1 mg/dL (ref 0.0–0.3)
Total Bilirubin: 0.3 mg/dL (ref 0.2–1.2)
Total Protein: 7.8 g/dL (ref 6.0–8.3)

## 2022-03-17 LAB — GAMMA GT: GGT: 23 U/L (ref 7–51)

## 2022-03-17 NOTE — Addendum Note (Signed)
Addended by: Manuela Schwartz on: 03/17/2022 08:19 AM   Modules accepted: Orders

## 2022-03-22 ENCOUNTER — Other Ambulatory Visit: Payer: Self-pay | Admitting: Family Medicine

## 2022-03-22 DIAGNOSIS — R748 Abnormal levels of other serum enzymes: Secondary | ICD-10-CM

## 2022-03-22 LAB — ALKALINE PHOSPHATASE, ISOENZYMES
Alkaline Phosphatase: 145 IU/L — ABNORMAL HIGH (ref 44–121)
BONE FRACTION: 22 % (ref 14–68)
INTESTINAL FRAC.: 3 % (ref 0–18)
LIVER FRACTION: 75 % (ref 18–85)

## 2022-03-26 ENCOUNTER — Ambulatory Visit (HOSPITAL_BASED_OUTPATIENT_CLINIC_OR_DEPARTMENT_OTHER)
Admission: RE | Admit: 2022-03-26 | Discharge: 2022-03-26 | Disposition: A | Discharge status: Home / Self Care | Payer: BC Managed Care – PPO | Source: Ambulatory Visit | Attending: Family Medicine | Admitting: Family Medicine

## 2022-03-26 ENCOUNTER — Encounter: Payer: Self-pay | Admitting: Family Medicine

## 2022-03-26 DIAGNOSIS — R748 Abnormal levels of other serum enzymes: Secondary | ICD-10-CM | POA: Diagnosis present

## 2022-04-19 ENCOUNTER — Ambulatory Visit
Admission: RE | Admit: 2022-04-19 | Discharge: 2022-04-19 | Disposition: A | Discharge status: Home / Self Care | Payer: BC Managed Care – PPO | Source: Ambulatory Visit | Attending: Family Medicine | Admitting: Family Medicine

## 2022-04-19 DIAGNOSIS — Z1231 Encounter for screening mammogram for malignant neoplasm of breast: Secondary | ICD-10-CM

## 2022-04-29 ENCOUNTER — Encounter: Payer: Self-pay | Admitting: Family Medicine

## 2022-04-30 MED ORDER — BUPROPION HCL ER (XL) 150 MG PO TB24: 150.0000 mg | ORAL_TABLET | Freq: Every day | ORAL | 0 refills | Status: DC

## 2022-04-30 MED ORDER — ROSUVASTATIN CALCIUM 40 MG PO TABS: 40.0000 mg | ORAL_TABLET | Freq: Every day | ORAL | 1 refills | Status: DC

## 2022-06-16 DIAGNOSIS — D2362 Other benign neoplasm of skin of left upper limb, including shoulder: Secondary | ICD-10-CM | POA: Diagnosis not present

## 2022-06-16 DIAGNOSIS — L821 Other seborrheic keratosis: Secondary | ICD-10-CM | POA: Diagnosis not present

## 2022-06-16 DIAGNOSIS — D225 Melanocytic nevi of trunk: Secondary | ICD-10-CM | POA: Diagnosis not present

## 2022-06-16 DIAGNOSIS — L578 Other skin changes due to chronic exposure to nonionizing radiation: Secondary | ICD-10-CM | POA: Diagnosis not present

## 2022-07-14 DIAGNOSIS — G4733 Obstructive sleep apnea (adult) (pediatric): Secondary | ICD-10-CM | POA: Diagnosis not present

## 2022-07-14 DIAGNOSIS — I484 Atypical atrial flutter: Secondary | ICD-10-CM | POA: Diagnosis not present

## 2022-07-15 DIAGNOSIS — I484 Atypical atrial flutter: Secondary | ICD-10-CM | POA: Diagnosis not present

## 2022-07-17 ENCOUNTER — Encounter: Payer: Self-pay | Admitting: Family Medicine

## 2022-07-19 MED ORDER — OMEPRAZOLE 40 MG PO CPDR: 40.0000 mg | DELAYED_RELEASE_CAPSULE | Freq: Every day | ORAL | 3 refills | Status: DC

## 2022-07-21 ENCOUNTER — Other Ambulatory Visit: Payer: Self-pay | Admitting: Family Medicine

## 2022-07-22 ENCOUNTER — Encounter: Payer: Self-pay | Admitting: Family Medicine

## 2022-07-22 MED ORDER — OMEPRAZOLE 20 MG PO CPDR: 20.0000 mg | DELAYED_RELEASE_CAPSULE | Freq: Every day | ORAL | 1 refills | Status: DC

## 2022-07-22 NOTE — Addendum Note (Signed)
Addended byConrad Ocotillo D on: 07/22/2022 10:31 AM   Modules accepted: Orders

## 2022-07-23 ENCOUNTER — Telehealth: Payer: Self-pay | Admitting: Family Medicine

## 2022-07-23 NOTE — Telephone Encounter (Signed)
[facility] called to get clarification on omeprazole prescripton. The 40 mg was called in on 07/19/22 and on 07/22/22 they received one for 20 mg. Is the 20 mg replacing the 40 mg prescription. Please advise at [phone].

## 2022-07-26 NOTE — Telephone Encounter (Signed)
Called [facility] to income that "yes" 20 mg is replacing the 40 mg. Pharmacist informed

## 2022-08-12 DIAGNOSIS — K080 Exfoliation of teeth due to systemic causes: Secondary | ICD-10-CM | POA: Diagnosis not present

## 2022-09-08 ENCOUNTER — Encounter: Payer: BC Managed Care – PPO | Admitting: Family Medicine

## 2022-09-15 ENCOUNTER — Encounter: Payer: Self-pay | Admitting: Family Medicine

## 2022-09-15 ENCOUNTER — Ambulatory Visit (INDEPENDENT_AMBULATORY_CARE_PROVIDER_SITE_OTHER): Payer: Medicare Other | Admitting: Family Medicine

## 2022-09-15 ENCOUNTER — Other Ambulatory Visit: Payer: Self-pay | Admitting: Family Medicine

## 2022-09-15 ENCOUNTER — Other Ambulatory Visit: Payer: Self-pay | Admitting: Surgery

## 2022-09-15 VITALS — BP 128/76 | HR 76 | Temp 98.0°F | Ht 66.0 in | Wt 169.2 lb

## 2022-09-15 DIAGNOSIS — Z0000 Encounter for general adult medical examination without abnormal findings: Secondary | ICD-10-CM | POA: Diagnosis not present

## 2022-09-15 DIAGNOSIS — E782 Mixed hyperlipidemia: Secondary | ICD-10-CM | POA: Diagnosis not present

## 2022-09-15 DIAGNOSIS — R7303 Prediabetes: Secondary | ICD-10-CM | POA: Diagnosis not present

## 2022-09-15 LAB — COMPREHENSIVE METABOLIC PANEL
ALT: 15 U/L (ref 0–35)
AST: 19 U/L (ref 0–37)
Albumin: 4.6 g/dL (ref 3.5–5.2)
Alkaline Phosphatase: 100 U/L (ref 39–117)
BUN: 13 mg/dL (ref 6–23)
CO2: 28 mEq/L (ref 19–32)
Calcium: 9.9 mg/dL (ref 8.4–10.5)
Chloride: 106 mEq/L (ref 96–112)
Creatinine, Ser: 0.96 mg/dL (ref 0.40–1.20)
GFR: 62.23 mL/min (ref >60.00)
Glucose, Bld: 98 mg/dL (ref 70–99)
Potassium: 4.5 mEq/L (ref 3.5–5.1)
Sodium: 141 mEq/L (ref 135–145)
Total Bilirubin: 0.4 mg/dL (ref 0.2–1.2)
Total Protein: 7.4 g/dL (ref 6.0–8.3)

## 2022-09-15 LAB — CBC
HCT: 44.4 % (ref 36.0–46.0)
Hemoglobin: 14.4 g/dL (ref 12.0–15.0)
MCHC: 32.4 g/dL (ref 30.0–36.0)
MCV: 90.4 fl (ref 78.0–100.0)
Platelets: 249 10*3/uL (ref 150.0–400.0)
RBC: 4.91 Mil/uL (ref 3.87–5.11)
RDW: 13.6 % (ref 11.5–15.5)
WBC: 6.3 10*3/uL (ref 4.0–10.5)

## 2022-09-15 LAB — LIPID PANEL
Cholesterol: 134 mg/dL (ref 0–200)
HDL: 52.2 mg/dL (ref >39.00)
LDL Cholesterol: 52 mg/dL (ref 0–99)
NonHDL: 82.15
Total CHOL/HDL Ratio: 3
Triglycerides: 152 mg/dL — ABNORMAL HIGH (ref 0.0–149.0)
VLDL: 30.4 mg/dL (ref 0.0–40.0)

## 2022-09-15 LAB — HEMOGLOBIN A1C: Hgb A1c MFr Bld: 6.1 % (ref 4.6–6.5)

## 2022-09-15 MED ORDER — METFORMIN HCL ER 500 MG PO TB24: 500.0000 mg | ORAL_TABLET | Freq: Every day | ORAL | 5 refills | Status: DC

## 2022-09-15 NOTE — Patient Instructions (Addendum)
Give [place] 2-3 business days to get the results of your labs back.   Keep the diet clean and stay active.  Let us know if you need anything.  Take Metamucil or Benefiber daily.  Foods that may reduce pain: 1) Ginger 2) Blueberries 3) Salmon 4) Pumpkin seeds 5) dark chocolate 6) turmeric 7) tart cherries 8) virgin olive oil 9) chilli peppers 10) mint 11) krill oil   Please consider counseling. Contact [phone] to schedule an appointment or inquire about cost/insurance coverage.  [facility] located at [address], [address], [place], [place].  Phone number = [phone].  Dr. Regan Lemming - Adult Psychiatry.    [facility] located at [address] [place], [place], [place]. Phone number = [phone].   [facility] located at [address] [place], [place].  Phone number = [phone].   [facility] located at [address] [place], [place], [place].  Phone number = [phone].  Coping skills Choose 5 that work for you: Take a deep breath Count to 20 Read a book Do a puzzle Meditate Bake Sing Knit Garden Pray Go outside Call a friend Listen to music Take a walk Color Send a note Take a bath Watch a movie Be alone in a quiet place Pet an animal Visit a friend Journal Exercise Stretch

## 2022-09-15 NOTE — Progress Notes (Signed)
Chief Complaint  Patient presents with   Annual Exam     Well Woman Laura Shields is here for a complete physical.   Her last physical was >1 year ago.  Current diet: in general, a "healthy" diet. Current exercise: wt lifting, cycling, walking. Weight is stable and she confirms daytime fatigue. Seatbelt? Yes Advanced directive?  Yes  Health Maintenance Colonoscopy- Yes Shingrix- Yes DEXA- Yes- had done last year with  Mammogram- Yes Tetanus- Yes Pneumonia- Yes Hep C screen- Yes  Past Medical History:  Diagnosis Date   Arthritis    bil knees   Atrial fibrillation (HCC)    Depression    GERD (gastroesophageal reflux disease)    Sleep apnea    +Bipap nightly     Past Surgical History:  Procedure Laterality Date   BREAST BIOPSY Right 11/03/2020   BREAST BIOPSY Bilateral 11/2020   BREAST EXCISIONAL BIOPSY Right 12/2020   x2   BREAST LUMPECTOMY WITH RADIOACTIVE SEED LOCALIZATION Right 01/06/2021   Procedure: RIGHT BREAST LUMPECTOMY WITH RADIOACTIVE SEED LOCALIZATION X 2;  Surgeon: Harriette Bouillon, MD;  Location: [facility];  Service: General;  Laterality: Right;   ENDOMETRIAL ABLATION     KNEE ARTHROSCOPY Right    TUBAL LIGATION      Medications  Current Outpatient Medications on File Prior to Visit  Medication Sig Dispense Refill   azelastine (ASTELIN) 0.1 % nasal spray Place into both nostrils 2 (two) times daily. Use in each nostril as directed     buPROPion (WELLBUTRIN XL) 150 MG 24 hr tablet Take 1 tablet (150 mg total) by mouth daily. 90 tablet 0   calcium carbonate (OSCAL) 1500 (600 Ca) MG TABS tablet Take 600 mg of elemental calcium by mouth 2 (two) times daily with a meal.     cholecalciferol (VITAMIN D3) 25 MCG (1000 UNIT) tablet Take 2,000 Units by mouth daily.     flecainide (TAMBOCOR) 100 MG tablet Take 100 mg by mouth 2 (two) times daily.     glucosamine-chondroitin 500-400 MG tablet Take 1 tablet by mouth 3 (three) times daily.      ondansetron (ZOFRAN-ODT) 4 MG disintegrating tablet Take 1 tablet (4 mg total) by mouth every 8 (eight) hours as needed for nausea or vomiting. 20 tablet 0   rosuvastatin (CRESTOR) 40 MG tablet Take 1 tablet (40 mg total) by mouth daily. 90 tablet 1   triamcinolone cream (KENALOG) 0.1 % Apply 1 Application topically 2 (two) times daily.      Allergies No Known Allergies  Review of Systems: Constitutional:  no fevers Eye:  no recent significant change in vision Ears:  No changes in hearing Nose/Mouth/Throat:  no complaints of nasal congestion, no sore throat Cardiovascular: no chest pain Respiratory:  No shortness of breath Gastrointestinal:  No change in bowel habits GU:  Female: negative for dysuria Integumentary:  no abnormal skin lesions reported Neurologic:  no headaches Endocrine:  denies unexplained weight changes  Exam BP 128/76 (BP Location: Left Arm, Patient Position: Sitting, Cuff Size: Normal)   Pulse 76   Temp 98 F (36.7 C) (Oral)   Ht 5\' 6"  (1.676 m)   Wt 169 lb 4 oz (76.8 kg)   SpO2 98%   BMI 27.32 kg/m  General:  well developed, well nourished, in no apparent distress Skin:  no significant moles, warts, or growths Head:  no masses, lesions, or tenderness Eyes:  pupils equal and round, sclera anicteric without injection Ears:  canals without lesions,  TMs shiny without retraction, no obvious effusion, no erythema Nose:  nares patent, mucosa normal, and no drainage Throat/Pharynx:  lips and gingiva without lesion; tongue and uvula midline; non-inflamed pharynx; no exudates or postnasal drainage Neck: neck supple without adenopathy, thyromegaly, or masses Lungs:  clear to auscultation, breath sounds equal bilaterally, no respiratory distress Cardio:  regular rate and rhythm, no bruits or LE edema Abdomen:  abdomen soft, nontender; bowel sounds normal; no masses or organomegaly Genital: Deferred Neuro:  gait normal; deep tendon reflexes normal and  symmetric Psych: well oriented with normal range of affect and appropriate judgment/insight  Assessment and Plan  Well adult exam  Mixed hyperlipidemia - Plan: CBC, Comprehensive metabolic panel, Lipid panel  Prediabetes - Plan: Hemoglobin A1c   Well 67 y.o. female. Counseled on diet and exercise. Advanced directive form provided today.  DEXA done last year, she will check her records to find out exactly when. Other orders as above. Follow up in 6 months. The patient voiced understanding and agreement to the plan.  Laura Roche [place], DO 09/15/22 10:27 AM

## 2022-09-16 ENCOUNTER — Encounter: Payer: Self-pay | Admitting: Surgery

## 2022-09-17 ENCOUNTER — Ambulatory Visit: Admission: RE | Admit: 2022-09-17 | Payer: Medicare Other | Source: Ambulatory Visit

## 2022-09-17 DIAGNOSIS — Z1239 Encounter for other screening for malignant neoplasm of breast: Secondary | ICD-10-CM

## 2022-09-17 MED ORDER — GADOPICLENOL 0.5 MMOL/ML IV SOLN
7.0000 mL | Freq: Once | INTRAVENOUS | Status: AC | PRN
  Administered 2022-09-17: 7 mL via INTRAVENOUS

## 2022-10-11 ENCOUNTER — Encounter: Payer: Self-pay | Admitting: Family Medicine

## 2022-10-11 MED ORDER — BUPROPION HCL ER (XL) 150 MG PO TB24: 150.0000 mg | ORAL_TABLET | Freq: Every day | ORAL | 0 refills | Status: DC

## 2022-10-11 MED ORDER — ROSUVASTATIN CALCIUM 40 MG PO TABS: 40.0000 mg | ORAL_TABLET | Freq: Every day | ORAL | 1 refills | Status: DC

## 2022-10-22 ENCOUNTER — Encounter: Payer: Self-pay | Admitting: Family Medicine

## 2022-10-22 ENCOUNTER — Ambulatory Visit (INDEPENDENT_AMBULATORY_CARE_PROVIDER_SITE_OTHER): Payer: Medicare Other | Admitting: Family Medicine

## 2022-10-22 VITALS — BP 128/78 | HR 70 | Temp 97.8°F | Ht 66.0 in | Wt 166.5 lb

## 2022-10-22 DIAGNOSIS — M542 Cervicalgia: Secondary | ICD-10-CM

## 2022-10-22 DIAGNOSIS — G4700 Insomnia, unspecified: Secondary | ICD-10-CM | POA: Insufficient documentation

## 2022-10-22 MED ORDER — TIZANIDINE HCL 4 MG PO TABS: 4.0000 mg | ORAL_TABLET | Freq: Four times a day (QID) | ORAL | 0 refills | Status: DC | PRN

## 2022-10-22 MED ORDER — TRAZODONE HCL 50 MG PO TABS
25.0000 mg | ORAL_TABLET | Freq: Every evening | ORAL | 3 refills | Status: AC | PRN
Start: 2022-10-22 — End: ?

## 2022-10-22 NOTE — Progress Notes (Signed)
Musculoskeletal Exam  Patient: Laura Shields DOB: 1955-07-12  DOS: 10/22/2022  SUBJECTIVE:  Chief Complaint:   Chief Complaint  Patient presents with   Torticollis   Headache    Laura Shields is a 67 y.o.  female for evaluation and treatment of neck pain.   Onset:  1 week ago. No inj or change in activity.  Location: both sides  Character:  sharp  Progression of issue:  is unchanged Associated symptoms: headache at base of her H on L; pain w flexion/extension Treatment: to date has been OTC NSAIDS, acetaminophen, and heat.   Neurovascular symptoms: no  Insomnia Since 8-9 mo ago. Getting worse. Lots of stress from parents' health situation. Not following with a therapist. Has not tried anything thus far.   Past Medical History:  Diagnosis Date   Arthritis    bil knees   Atrial fibrillation (HCC)    Depression    GERD (gastroesophageal reflux disease)    Sleep apnea    +Bipap nightly    Objective: VITAL SIGNS: BP 128/78 (BP Location: Left Arm, Patient Position: Sitting, Cuff Size: Normal)   Pulse 70   Temp 97.8 F (36.6 C) (Oral)   Ht 5\' 6"  (1.676 m)   Wt 166 lb 8 oz (75.5 kg)   SpO2 99%   BMI 26.87 kg/m  Constitutional: Well formed, well developed. No acute distress. Thorax & Lungs: No accessory muscle use Musculoskeletal: neck.   Normal active range of motion: yes.   Normal passive range of motion: yes Tenderness to palpation: mild ttp over cerv parasp msc and subocc triangle b/l Deformity: no Ecchymosis: no Tests positive: none Tests negative: Spurling's Neurologic: Normal sensory function. No focal deficits noted. DTR's equal and symmetric in UE's/LE's. No clonus. Psychiatric: Normal mood. Age appropriate judgment and insight. Alert & oriented x 3.    Assessment:  Insomnia, unspecified type - Plan: traZODone (DESYREL) 50 MG tablet  Neck pain - Plan: tiZANidine (ZANAFLEX) 4 MG tablet  Plan: Stretches/exercises, heat, ice, Tylenol.  Tizanidine as  needed, warned about potential drowsiness with this. Chronic, uncontrolled.  Start trazodone 25 mg daily as needed, okay to uptitrate to 50 mg nightly as needed if needed.  Sleep hygiene information provided.  Sleep counseling resources provided. F/u in 1 month to recheck. The patient voiced understanding and agreement to the plan.   Jilda Roche [place], DO 10/22/22  1:50 PM

## 2022-10-22 NOTE — Patient Instructions (Addendum)
Heat (pad or rice pillow in microwave) over affected area, 10-15 minutes twice daily.   OK to take Tylenol 1000 mg (2 extra strength tabs) or 975 mg (3 regular strength tabs) every 6 hours as needed.  Ice/cold pack over area for 10-15 min twice daily.  Take muscle relaxer 1-2 hours before planned bedtime. If it makes you drowsy, do not take during the day. You can try half a tab the following night.  Sleep is important to [place] all. Getting good sleep is imperative to adequate functioning during the day. Work with our counselors who are trained to help people obtain quality sleep. Call [phone] to schedule an appointment or if you are curious about insurance coverage/cost.  Sleep Hygiene Tips: Do not watch TV or look at screens within 1 hour of going to bed. If you do, make sure there is a blue light filter (nighttime mode) involved. Try to go to bed around the same time every night. Wake up at the same time within 1 hour of regular time. Ex: If you wake up at 7 AM for work, do not sleep past 8 AM on days that you don't work. Do not drink alcohol before bedtime. Do not consume caffeine-containing beverages after noon or within 9 hours of intended bedtime. Get regular exercise/physical activity in your life, but not within 2 hours of planned bedtime. Do not take naps.  Do not eat within 2 hours of planned bedtime. Melatonin, 3-5 mg 30-60 minutes before planned bedtime may be helpful.  The bed should be for sleep or sex only. If after 20-30 minutes you are unable to fall asleep, get up and do something relaxing. Do this until you feel ready to go to sleep again.   Let us know if you need anything.  EXERCISES RANGE OF MOTION (ROM) AND STRETCHING EXERCISES  These exercises may help you when beginning to rehabilitate your issue. In order to successfully resolve your symptoms, you must improve your posture. These exercises are designed to help reduce the forward-head and rounded-shoulder posture  which contributes to this condition. Your symptoms may resolve with or without further involvement from your physician, physical therapist or athletic trainer. While completing these exercises, remember:  Restoring tissue flexibility helps normal motion to return to the joints. This allows healthier, less painful movement and activity. An effective stretch should be held for at least 20 seconds, although you may need to begin with shorter hold times for comfort. A stretch should never be painful. You should only feel a gentle lengthening or release in the stretched tissue. Do not do any stretch or exercise that you cannot tolerate.  STRETCH- Axial Extensors Lie on your back on the floor. You may bend your knees for comfort. Place a rolled-up hand towel or dish towel, about 2 inches in diameter, under the part of your head that makes contact with the floor. Gently tuck your chin, as if trying to make a "double chin," until you feel a gentle stretch at the base of your head. Hold 15-20 seconds. Repeat 2-3 times. Complete this exercise 1 time per day.   STRETCH - Axial Extension  Stand or sit on a firm surface. Assume a good posture: chest up, shoulders drawn back, abdominal muscles slightly tense, knees unlocked (if standing) and feet hip width apart. Slowly retract your chin so your head slides back and your chin slightly lowers. Continue to look straight ahead. You should feel a gentle stretch in the back of your head. Be  certain not to feel an aggressive stretch since this can cause headaches later. Hold for 15-20 seconds. Repeat 2-3 times. Complete this exercise 1 time per day.  STRETCH - Cervical Side Bend  Stand or sit on a firm surface. Assume a good posture: chest up, shoulders drawn back, abdominal muscles slightly tense, knees unlocked (if standing) and feet hip width apart. Without letting your nose or shoulders move, slowly tip your right / left ear to your shoulder until your feel a  gentle stretch in the muscles on the opposite side of your neck. Hold 15-20 seconds. Repeat 2-3 times. Complete this exercise 1-2 times per day.  STRETCH - Cervical Rotators  Stand or sit on a firm surface. Assume a good posture: chest up, shoulders drawn back, abdominal muscles slightly tense, knees unlocked (if standing) and feet hip width apart. Keeping your eyes level with the ground, slowly turn your head until you feel a gentle stretch along the back and opposite side of your neck. Hold 15-20 seconds. Repeat 2-3 times. Complete this exercise 1-2 times per day.  RANGE OF MOTION - Neck Circles  Stand or sit on a firm surface. Assume a good posture: chest up, shoulders drawn back, abdominal muscles slightly tense, knees unlocked (if standing) and feet hip width apart. Gently roll your head down and around from the back of one shoulder to the back of the other. The motion should never be forced or painful. Repeat the motion 10-20 times, or until you feel the neck muscles relax and loosen. Repeat 2-3 times. Complete the exercise 1-2 times per day. STRENGTHENING EXERCISES - Cervical Strain and Sprain These exercises may help you when beginning to rehabilitate your injury. They may resolve your symptoms with or without further involvement from your physician, physical therapist, or athletic trainer. While completing these exercises, remember:  Muscles can gain both the endurance and the strength needed for everyday activities through controlled exercises. Complete these exercises as instructed by your physician, physical therapist, or athletic trainer. Progress the resistance and repetitions only as guided. You may experience muscle soreness or fatigue, but the pain or discomfort you are trying to eliminate should never worsen during these exercises. If this pain does worsen, stop and make certain you are following the directions exactly. If the pain is still present after adjustments, discontinue  the exercise until you can discuss the trouble with your clinician.  STRENGTH - Cervical Flexors, Isometric Face a wall, standing about 6 inches away. Place a small pillow, a ball about 6-8 inches in diameter, or a folded towel between your forehead and the wall. Slightly tuck your chin and gently push your forehead into the soft object. Push only with mild to moderate intensity, building up tension gradually. Keep your jaw and forehead relaxed. Hold 10 to 20 seconds. Keep your breathing relaxed. Release the tension slowly. Relax your neck muscles completely before you start the next repetition. Repeat 2-3 times. Complete this exercise 1 time per day.  STRENGTH- Cervical Lateral Flexors, Isometric  Stand about 6 inches away from a wall. Place a small pillow, a ball about 6-8 inches in diameter, or a folded towel between the side of your head and the wall. Slightly tuck your chin and gently tilt your head into the soft object. Push only with mild to moderate intensity, building up tension gradually. Keep your jaw and forehead relaxed. Hold 10 to 20 seconds. Keep your breathing relaxed. Release the tension slowly. Relax your neck muscles completely before you start  the next repetition. Repeat 2-3 times. Complete this exercise 1 time per day.  STRENGTH - Cervical Extensors, Isometric  Stand about 6 inches away from a wall. Place a small pillow, a ball about 6-8 inches in diameter, or a folded towel between the back of your head and the wall. Slightly tuck your chin and gently tilt your head back into the soft object. Push only with mild to moderate intensity, building up tension gradually. Keep your jaw and forehead relaxed. Hold 10 to 20 seconds. Keep your breathing relaxed. Release the tension slowly. Relax your neck muscles completely before you start the next repetition. Repeat 2-3 times. Complete this exercise 1 time per day.  POSTURE AND BODY MECHANICS CONSIDERATIONS Keeping correct  posture when sitting, standing or completing your activities will reduce the stress put on different body tissues, allowing injured tissues a chance to heal and limiting painful experiences. The following are general guidelines for improved posture. Your physician or physical therapist will provide you with any instructions specific to your needs. While reading these guidelines, remember: The exercises prescribed by your provider will help you have the flexibility and strength to maintain correct postures. The correct posture provides the optimal environment for your joints to work. All of your joints have less wear and tear when properly supported by a spine with good posture. This means you will experience a healthier, less painful body. Correct posture must be practiced with all of your activities, especially prolonged sitting and standing. Correct posture is as important when doing repetitive low-stress activities (typing) as it is when doing a single heavy-load activity (lifting).  PROLONGED STANDING WHILE SLIGHTLY LEANING FORWARD When completing a task that requires you to lean forward while standing in one place for a long time, place either foot up on a stationary 2- to 4-inch high object to help maintain the best posture. When both feet are on the ground, the low back tends to lose its slight inward curve. If this curve flattens (or becomes too large), then the back and your other joints will experience too much stress, fatigue more quickly, and can cause pain.   RESTING POSITIONS Consider which positions are most painful for you when choosing a resting position. If you have pain with flexion-based activities (sitting, bending, stooping, squatting), choose a position that allows you to rest in a less flexed posture. You would want to avoid curling into a fetal position on your side. If your pain worsens with extension-based activities (prolonged standing, working overhead), avoid resting in an  extended position such as sleeping on your stomach. Most people will find more comfort when they rest with their spine in a more neutral position, neither too rounded nor too arched. Lying on a non-sagging bed on your side with a pillow between your knees, or on your back with a pillow under your knees will often provide some relief. Keep in mind, being in any one position for a prolonged period of time, no matter how correct your posture, can still lead to stiffness.  WALKING Walk with an upright posture. Your ears, shoulders, and hips should all line up. OFFICE WORK When working at a desk, create an environment that supports good, upright posture. Without extra support, muscles fatigue and lead to excessive strain on joints and other tissues.  CHAIR: A chair should be able to slide under your desk when your back makes contact with the back of the chair. This allows you to work closely. The chair's height should allow your  eyes to be level with the upper part of your monitor and your hands to be slightly lower than your elbows. Body position: Your feet should make contact with the floor. If this is not possible, use a foot rest. Keep your ears over your shoulders. This will reduce stress on your neck and low back.

## 2022-11-04 ENCOUNTER — Encounter: Payer: Self-pay | Admitting: Family Medicine

## 2022-11-04 ENCOUNTER — Other Ambulatory Visit: Payer: Self-pay | Admitting: Family Medicine

## 2022-11-04 DIAGNOSIS — E119 Type 2 diabetes mellitus without complications: Secondary | ICD-10-CM | POA: Diagnosis not present

## 2022-11-04 LAB — HM DIABETES EYE EXAM

## 2022-11-08 DIAGNOSIS — Z1151 Encounter for screening for human papillomavirus (HPV): Secondary | ICD-10-CM | POA: Diagnosis not present

## 2022-11-08 DIAGNOSIS — R87619 Unspecified abnormal cytological findings in specimens from cervix uteri: Secondary | ICD-10-CM | POA: Diagnosis not present

## 2022-11-08 DIAGNOSIS — Z6827 Body mass index (BMI) 27.0-27.9, adult: Secondary | ICD-10-CM | POA: Diagnosis not present

## 2022-11-08 DIAGNOSIS — Z124 Encounter for screening for malignant neoplasm of cervix: Secondary | ICD-10-CM | POA: Diagnosis not present

## 2022-11-08 DIAGNOSIS — Z01419 Encounter for gynecological examination (general) (routine) without abnormal findings: Secondary | ICD-10-CM | POA: Diagnosis not present

## 2022-11-08 DIAGNOSIS — R87810 Cervical high risk human papillomavirus (HPV) DNA test positive: Secondary | ICD-10-CM | POA: Diagnosis not present

## 2022-11-08 DIAGNOSIS — R87616 Satisfactory cervical smear but lacking transformation zone: Secondary | ICD-10-CM | POA: Diagnosis not present

## 2022-11-09 ENCOUNTER — Other Ambulatory Visit: Payer: Self-pay | Admitting: Family Medicine

## 2022-11-10 DIAGNOSIS — H5213 Myopia, bilateral: Secondary | ICD-10-CM | POA: Diagnosis not present

## 2022-11-10 LAB — HM PAP SMEAR
HM Pap smear: ABNORMAL
HPV 16/18/45 genotyping: NEGATIVE
HPV, high-risk: POSITIVE

## 2022-11-10 LAB — RESULTS CONSOLE HPV: CHL HPV: NEGATIVE

## 2022-11-13 ENCOUNTER — Other Ambulatory Visit: Payer: Self-pay | Admitting: Family Medicine

## 2022-11-13 DIAGNOSIS — G4700 Insomnia, unspecified: Secondary | ICD-10-CM

## 2022-11-15 NOTE — Telephone Encounter (Signed)
Requesting 90 days.

## 2022-11-18 DIAGNOSIS — R87610 Atypical squamous cells of undetermined significance on cytologic smear of cervix (ASC-US): Secondary | ICD-10-CM | POA: Diagnosis not present

## 2022-11-18 DIAGNOSIS — N870 Mild cervical dysplasia: Secondary | ICD-10-CM | POA: Diagnosis not present

## 2022-11-19 LAB — HM PAP SMEAR: HM Pap smear: NORMAL

## 2022-11-22 ENCOUNTER — Ambulatory Visit (INDEPENDENT_AMBULATORY_CARE_PROVIDER_SITE_OTHER): Payer: Medicare Other | Admitting: Family Medicine

## 2022-11-22 ENCOUNTER — Encounter: Payer: Self-pay | Admitting: Family Medicine

## 2022-11-22 VITALS — BP 138/82 | HR 105 | Temp 98.4°F | Ht 66.0 in | Wt 166.5 lb

## 2022-11-22 DIAGNOSIS — G4700 Insomnia, unspecified: Secondary | ICD-10-CM | POA: Diagnosis not present

## 2022-11-22 DIAGNOSIS — M542 Cervicalgia: Secondary | ICD-10-CM

## 2022-11-22 MED ORDER — METHOCARBAMOL 500 MG PO TABS: 500.0000 mg | ORAL_TABLET | Freq: Three times a day (TID) | ORAL | 0 refills | Status: DC | PRN

## 2022-11-22 NOTE — Progress Notes (Signed)
Chief Complaint  Patient presents with   Follow-up    Subjective: Patient is a 67 y.o. female here for f/u.  Pt started on trazodone 50 mg qhs. Reports marked improvement (~70%) since starting. No AE's. Still having a good bit of stress with her parents' health.   Her neck pain is overall better, around 60%.  She has been mostly compliant with the stretches and exercises but not fully.  No neurologic signs or symptoms, bruising, redness, or swelling.  Range of motion is intact.  Muscle relaxer made her feel drunk.  Past Medical History:  Diagnosis Date   Arthritis    bil knees   Atrial fibrillation (HCC)    Depression    GERD (gastroesophageal reflux disease)    Sleep apnea    +Bipap nightly    Objective: BP 138/82 (BP Location: Left Arm, Patient Position: Sitting, Cuff Size: Normal)   Pulse (!) 105   Temp 98.4 F (36.9 C) (Oral)   Ht 5\' 6"  (1.676 m)   Wt 166 lb 8 oz (75.5 kg)   SpO2 94%   BMI 26.87 kg/m  General: Awake, appears stated age MSK: No TTP over the suboccipital triangle, cervical paraspinal musculature, midline, or trapezius musculature, there is some hypertonicity over the cervical paraspinal musculature and trapezius bilaterally. Neuro: DTRs equal and symmetric throughout, no clonus, no cerebellar signs, grip strength adequate bilaterally Lungs: No accessory muscle use Psych: Age appropriate judgment and insight, normal affect and mood  Assessment and Plan: Insomnia, unspecified type  Neck pain  Chronic, now stable.  Continue trazodone 50 mg nightly. Transition Zanaflex to methocarbamol.  Warned that muscle relaxers seem to lose efficacy after 3 weeks of dealing with an issue.  She is better overall so she will continue her home stretches and exercises.  If things worsen or plateau, she will let us know and we will set her up with physical therapy. Follow-up as originally scheduled in January or as needed. The patient voiced understanding and agreement to  the plan.  Laura Shields [place], DO 11/22/22  11:26 AM

## 2022-11-22 NOTE — Patient Instructions (Signed)
Continue the stretches/exercises. Let me know if you plateau or are getting worse and we can set you up with physical therapy.   Let us know if you need anything.

## 2022-12-08 DIAGNOSIS — N879 Dysplasia of cervix uteri, unspecified: Secondary | ICD-10-CM | POA: Diagnosis not present

## 2022-12-13 DIAGNOSIS — N879 Dysplasia of cervix uteri, unspecified: Secondary | ICD-10-CM | POA: Diagnosis not present

## 2022-12-21 ENCOUNTER — Encounter: Payer: Self-pay | Admitting: Family Medicine

## 2022-12-22 DIAGNOSIS — H524 Presbyopia: Secondary | ICD-10-CM | POA: Diagnosis not present

## 2022-12-28 ENCOUNTER — Other Ambulatory Visit: Payer: Self-pay

## 2022-12-28 MED ORDER — BUPROPION HCL ER (XL) 150 MG PO TB24: 150.0000 mg | ORAL_TABLET | Freq: Every day | ORAL | 0 refills | Status: DC

## 2022-12-31 ENCOUNTER — Ambulatory Visit (INDEPENDENT_AMBULATORY_CARE_PROVIDER_SITE_OTHER): Payer: Medicare Other | Admitting: Physician Assistant

## 2022-12-31 ENCOUNTER — Encounter: Payer: Self-pay | Admitting: Physician Assistant

## 2022-12-31 VITALS — BP 126/80 | HR 71 | Temp 98.3°F | Ht 66.0 in | Wt 169.2 lb

## 2022-12-31 DIAGNOSIS — J069 Acute upper respiratory infection, unspecified: Secondary | ICD-10-CM

## 2022-12-31 DIAGNOSIS — R6889 Other general symptoms and signs: Secondary | ICD-10-CM | POA: Diagnosis not present

## 2022-12-31 LAB — POC COVID19 BINAXNOW: SARS Coronavirus 2 Ag: NEGATIVE

## 2022-12-31 LAB — POCT INFLUENZA A/B
Influenza A, POC: NEGATIVE
Influenza B, POC: NEGATIVE

## 2022-12-31 MED ORDER — AMOXICILLIN 875 MG PO TABS
875.0000 mg | ORAL_TABLET | Freq: Two times a day (BID) | ORAL | 0 refills | Status: AC
Start: 2022-12-31 — End: 2023-01-07

## 2022-12-31 MED ORDER — BENZONATATE 100 MG PO CAPS: 100.0000 mg | ORAL_CAPSULE | Freq: Two times a day (BID) | ORAL | 0 refills | Status: DC | PRN

## 2022-12-31 NOTE — Progress Notes (Signed)
Established patient visit   Patient: Laura Shields   DOB: Oct 25, 1955   67 y.o. Female  MRN: [id number] Visit Date: 12/31/2022  Today's healthcare provider: Alfredia Ferguson, PA-C   Cc. Uri sxs  Subjective    Patient presents with postnasal drip, mild cough, loss of voice, nasal congestion since Tuesday, x 4 days.  She denies any shortness of breath or wheezing.  She is using NyQuil at night.  She is seeing her parents next week and wanted to be sure this was not something she needed treatment for.  Medications: Outpatient Medications Prior to Visit  Medication Sig   azelastine (ASTELIN) 0.1 % nasal spray Place into both nostrils 2 (two) times daily. Use in each nostril as directed   buPROPion (WELLBUTRIN XL) 150 MG 24 hr tablet Take 1 tablet (150 mg total) by mouth daily.   calcium carbonate (OSCAL) 1500 (600 Ca) MG TABS tablet Take 600 mg of elemental calcium by mouth 2 (two) times daily with a meal.   cholecalciferol (VITAMIN D3) 25 MCG (1000 UNIT) tablet Take 2,000 Units by mouth daily.   flecainide (TAMBOCOR) 100 MG tablet Take 100 mg by mouth 2 (two) times daily.   glucosamine-chondroitin 500-400 MG tablet Take 1 tablet by mouth 3 (three) times daily.   methocarbamol (ROBAXIN) 500 MG tablet Take 1 tablet (500 mg total) by mouth every 8 (eight) hours as needed for muscle spasms.   ondansetron (ZOFRAN-ODT) 4 MG disintegrating tablet Take 1 tablet (4 mg total) by mouth every 8 (eight) hours as needed for nausea or vomiting.   rosuvastatin (CRESTOR) 40 MG tablet Take 1 tablet (40 mg total) by mouth daily.   traZODone (DESYREL) 50 MG tablet TAKE 0.5-1 TABLETS BY MOUTH AT BEDTIME AS NEEDED FOR SLEEP.   triamcinolone cream (KENALOG) 0.1 % Apply 1 Application topically 2 (two) times daily.   No facility-administered medications prior to visit.    Review of Systems  Constitutional:  Positive for fatigue. Negative for fever.  HENT:  Positive for congestion, postnasal drip and  rhinorrhea.   Respiratory:  Positive for cough. Negative for shortness of breath.   Cardiovascular:  Negative for chest pain and leg swelling.  Gastrointestinal:  Negative for abdominal pain.  Neurological:  Negative for dizziness and headaches.       Objective    BP 126/80   Pulse 71   Temp 98.3 F (36.8 C) (Oral)   Ht 5\' 6"  (1.676 m)   Wt 169 lb 4 oz (76.8 kg)   SpO2 96%   BMI 27.32 kg/m    Physical Exam Constitutional:      General: She is awake.     Appearance: She is well-developed.  HENT:     Head: Normocephalic.     Right Ear: Tympanic membrane normal.     Left Ear: Tympanic membrane normal.     Ears:     Comments: Mild clear serous effusion behind bilateral TMs    Mouth/Throat:     Pharynx: Posterior oropharyngeal erythema present. No oropharyngeal exudate.  Eyes:     Conjunctiva/sclera: Conjunctivae normal.  Cardiovascular:     Rate and Rhythm: Normal rate and regular rhythm.     Heart sounds: Normal heart sounds.  Pulmonary:     Effort: Pulmonary effort is normal.     Breath sounds: Normal breath sounds.  Skin:    General: Skin is warm.  Neurological:     Mental Status: She is alert and oriented to  person, place, and time.  Psychiatric:        Attention and Perception: Attention normal.        Mood and Affect: Mood normal.        Speech: Speech normal.        Behavior: Behavior is cooperative.     Results for orders placed or performed in visit on 12/31/22  POC COVID-19  Result Value Ref Range   SARS Coronavirus 2 Ag Negative Negative  POCT Influenza A/B  Result Value Ref Range   Influenza A, POC Negative Negative   Influenza B, POC Negative Negative    Assessment & Plan    Upper respiratory tract infection, unspecified type -     Amoxicillin; Take 1 tablet (875 mg total) by mouth 2 (two) times daily for 7 days.  Dispense: 14 tablet; Refill: 0 -     Benzonatate; Take 1 capsule (100 mg total) by mouth 2 (two) times daily as needed for cough.   Dispense: 20 capsule; Refill: 0  Flu-like symptoms -     POC COVID-19 BinaxNow -     POCT Influenza A/B   Point-of-care COVID and flu negative. Reassured patient this is a viral infection, prescribed Tessalon as needed for cough, recommending antihistamines over-the-counter, hydration, ibuprofen/Tylenol as needed.  As patient is headed out of town after the weekend and she is concerned about spreading illness to her elderly parents, I will prescribe amoxicillin twice daily for 7 days.  Advised patient only to take if her symptoms do not improve through the weekend.  Patient is agreeable.  Return if symptoms worsen or fail to improve.       Alfredia Ferguson, PA-C  [facility] [phone] (phone) [phone] (fax)  [facility]

## 2023-02-02 DIAGNOSIS — G4733 Obstructive sleep apnea (adult) (pediatric): Secondary | ICD-10-CM | POA: Diagnosis not present

## 2023-02-02 DIAGNOSIS — K2100 Gastro-esophageal reflux disease with esophagitis, without bleeding: Secondary | ICD-10-CM | POA: Diagnosis not present

## 2023-02-02 DIAGNOSIS — I4892 Unspecified atrial flutter: Secondary | ICD-10-CM | POA: Diagnosis not present

## 2023-02-04 DIAGNOSIS — L820 Inflamed seborrheic keratosis: Secondary | ICD-10-CM | POA: Diagnosis not present

## 2023-02-21 DIAGNOSIS — Z4889 Encounter for other specified surgical aftercare: Secondary | ICD-10-CM | POA: Diagnosis not present

## 2023-02-21 DIAGNOSIS — Z1239 Encounter for other screening for malignant neoplasm of breast: Secondary | ICD-10-CM | POA: Diagnosis not present

## 2023-02-21 DIAGNOSIS — Z803 Family history of malignant neoplasm of breast: Secondary | ICD-10-CM | POA: Diagnosis not present

## 2023-03-08 ENCOUNTER — Other Ambulatory Visit: Payer: Self-pay | Admitting: Family Medicine

## 2023-03-08 DIAGNOSIS — Z1231 Encounter for screening mammogram for malignant neoplasm of breast: Secondary | ICD-10-CM

## 2023-03-18 ENCOUNTER — Encounter: Payer: Self-pay | Admitting: Family Medicine

## 2023-03-18 ENCOUNTER — Ambulatory Visit (INDEPENDENT_AMBULATORY_CARE_PROVIDER_SITE_OTHER): Payer: Medicare Other | Admitting: Family Medicine

## 2023-03-18 VITALS — BP 128/76 | HR 91 | Temp 98.0°F | Resp 16 | Ht 66.0 in | Wt 168.0 lb

## 2023-03-18 DIAGNOSIS — F339 Major depressive disorder, recurrent, unspecified: Secondary | ICD-10-CM

## 2023-03-18 DIAGNOSIS — M542 Cervicalgia: Secondary | ICD-10-CM | POA: Diagnosis not present

## 2023-03-18 DIAGNOSIS — E782 Mixed hyperlipidemia: Secondary | ICD-10-CM | POA: Diagnosis not present

## 2023-03-18 DIAGNOSIS — R7303 Prediabetes: Secondary | ICD-10-CM | POA: Diagnosis not present

## 2023-03-18 LAB — LIPID PANEL
Cholesterol: 136 mg/dL (ref 0–200)
HDL: 50.8 mg/dL (ref >39.00)
LDL Cholesterol: 55 mg/dL (ref 0–99)
NonHDL: 84.89
Total CHOL/HDL Ratio: 3
Triglycerides: 148 mg/dL (ref 0.0–149.0)
VLDL: 29.6 mg/dL (ref 0.0–40.0)

## 2023-03-18 LAB — COMPREHENSIVE METABOLIC PANEL
ALT: 15 U/L (ref 0–35)
AST: 19 U/L (ref 0–37)
Albumin: 4.7 g/dL (ref 3.5–5.2)
Alkaline Phosphatase: 97 U/L (ref 39–117)
BUN: 14 mg/dL (ref 6–23)
CO2: 29 meq/L (ref 19–32)
Calcium: 9.7 mg/dL (ref 8.4–10.5)
Chloride: 103 meq/L (ref 96–112)
Creatinine, Ser: 1.04 mg/dL (ref 0.40–1.20)
GFR: 56.33 mL/min — ABNORMAL LOW (ref >60.00)
Glucose, Bld: 98 mg/dL (ref 70–99)
Potassium: 4.5 meq/L (ref 3.5–5.1)
Sodium: 140 meq/L (ref 135–145)
Total Bilirubin: 0.4 mg/dL (ref 0.2–1.2)
Total Protein: 7.4 g/dL (ref 6.0–8.3)

## 2023-03-18 LAB — HEMOGLOBIN A1C: Hgb A1c MFr Bld: 6.2 % (ref 4.6–6.5)

## 2023-03-18 MED ORDER — BUPROPION HCL ER (XL) 150 MG PO TB24: 150.0000 mg | ORAL_TABLET | Freq: Every day | ORAL | 3 refills | Status: DC

## 2023-03-18 MED ORDER — ROSUVASTATIN CALCIUM 40 MG PO TABS: 40.0000 mg | ORAL_TABLET | Freq: Every day | ORAL | 3 refills | Status: DC

## 2023-03-18 NOTE — Progress Notes (Signed)
Chief Complaint  Patient presents with   Follow-up    Follow up    Subjective Laura Shields presents for f/u depression.  Pt is currently being treated with Wellbutrin XL 150 mg daily. Her mother recently passed away-overall she is having good days and bad days.  Reports doing OK since treatment. No thoughts of harming self or others. No self-medication with alcohol, prescription drugs or illicit drugs. Pt is not following with a counselor/psychologist.  Hyperlipidemia Patient presents for dyslipidemia follow up. Currently being treated with Crestor 20 mg/d and compliance with treatment thus far has been good. She complains of myalgias. Her diet lately has been poor but she is trying to get back on track. Exercise: Going to start lifting weights and cycling again. No chest pain or shortness of breath. The patient is not known to have coexisting coronary artery disease.  Next involving her from last visit, requesting referral to physical therapy.  Concerned about kyphosis.  Past Medical History:  Diagnosis Date   Arthritis    bil knees   Atrial fibrillation (HCC)    Depression    GERD (gastroesophageal reflux disease)    Sleep apnea    +Bipap nightly   Allergies as of 03/18/2023   No Known Allergies      Medication List        Accurate as of March 18, 2023  8:52 AM. If you have any questions, ask your nurse or doctor.          STOP taking these medications    benzonatate 100 MG capsule Commonly known as: TESSALON Stopped by: Jilda Roche Amerigo Mcglory   methocarbamol 500 MG tablet Commonly known as: ROBAXIN Stopped by: Sharlene Dory       TAKE these medications    azelastine 0.1 % nasal spray Commonly known as: ASTELIN Place into both nostrils 2 (two) times daily. Use in each nostril as directed   buPROPion 150 MG 24 hr tablet Commonly known as: WELLBUTRIN XL Take 1 tablet (150 mg total) by mouth daily.   calcium carbonate 1500 (600 Ca)  MG Tabs tablet Commonly known as: OSCAL Take 600 mg of elemental calcium by mouth 2 (two) times daily with a meal.   cholecalciferol 25 MCG (1000 UNIT) tablet Commonly known as: VITAMIN D3 Take 2,000 Units by mouth daily.   flecainide 100 MG tablet Commonly known as: TAMBOCOR Take 100 mg by mouth 2 (two) times daily.   glucosamine-chondroitin 500-400 MG tablet Take 1 tablet by mouth 3 (three) times daily.   ondansetron 4 MG disintegrating tablet Commonly known as: ZOFRAN-ODT Take 1 tablet (4 mg total) by mouth every 8 (eight) hours as needed for nausea or vomiting.   rosuvastatin 40 MG tablet Commonly known as: CRESTOR Take 1 tablet (40 mg total) by mouth daily.   traZODone 50 MG tablet Commonly known as: DESYREL TAKE 0.5-1 TABLETS BY MOUTH AT BEDTIME AS NEEDED FOR SLEEP.   triamcinolone cream 0.1 % Commonly known as: KENALOG Apply 1 Application topically 2 (two) times daily.        Exam BP 128/76   Pulse 91   Temp 98 F (36.7 C) (Oral)   Resp 16   Ht 5\' 6"  (1.676 m)   Wt 168 lb (76.2 kg)   SpO2 95%   BMI 27.12 kg/m  General:  well developed, well nourished, in no apparent distress Heart: RRR, no lower extremity edema, no bruits Lungs: CTAB.  No respiratory distress Psych: well oriented with normal  range of affect and age-appropriate judgement/insight, alert and oriented x4.  Assessment and Plan  Depression, recurrent (HCC)  Mixed hyperlipidemia - Plan: Comprehensive metabolic panel, Lipid panel  Neck pain - Plan: Ambulatory referral to Physical Therapy  Prediabetes - Plan: Hemoglobin A1c  Chronic, stable.  Continue Wellbutrin XL 150 mg daily. Chronic, stable.  Continue Crestor 40 mg daily.  Counseled on diet and exercise. Refer to physical therapy Check A1c. F/u in 6 mo. The patient voiced understanding and agreement to the plan.  Jilda Roche [place], DO 03/18/23 8:52 AM

## 2023-03-18 NOTE — Patient Instructions (Signed)
Give us 2-3 business days to get the results of your labs back.  Aim to do some physical exertion for 150 minutes per week. This is typically divided into 5 days per week, 30 minutes per day. The activity should be enough to get your heart rate up. Anything is better than nothing if you have time constraints.  If you do not hear anything about your referral in the next 1-2 weeks, call our office and ask for an update.  Let us know if you need anything.

## 2023-03-21 ENCOUNTER — Other Ambulatory Visit: Payer: Self-pay

## 2023-03-21 DIAGNOSIS — K080 Exfoliation of teeth due to systemic causes: Secondary | ICD-10-CM | POA: Diagnosis not present

## 2023-03-21 MED ORDER — ROSUVASTATIN CALCIUM 40 MG PO TABS: 40.0000 mg | ORAL_TABLET | Freq: Every day | ORAL | 3 refills | Status: DC

## 2023-03-24 NOTE — Therapy (Signed)
OUTPATIENT PHYSICAL THERAPY CERVICAL EVALUATION   Patient Name: Laura Shields MRN: [id number] DOB:05/26/1955, 68 y.o., female Today's Date: 03/31/2023   END OF SESSION:  PT End of Session - 03/31/23 0803     Visit Number 1    Date for PT Re-Evaluation 05/26/23    Authorization Type Blue Medicare & State [employer]    PT Start Time 0803    PT Stop Time [phone]    PT Time Calculation (min) 46 min    Activity Tolerance Patient tolerated treatment well    Behavior During Therapy [facility] for tasks assessed/performed             Past Medical History:  Diagnosis Date   Arthritis    bil knees   Atrial fibrillation (HCC)    Depression    GERD (gastroesophageal reflux disease)    Sleep apnea    +Bipap nightly   Past Surgical History:  Procedure Laterality Date   BREAST BIOPSY Right 11/03/2020   BREAST BIOPSY Bilateral 11/2020   BREAST EXCISIONAL BIOPSY Right 12/2020   x2   BREAST LUMPECTOMY WITH RADIOACTIVE SEED LOCALIZATION Right 01/06/2021   Procedure: RIGHT BREAST LUMPECTOMY WITH RADIOACTIVE SEED LOCALIZATION X 2;  Surgeon: Harriette Bouillon, MD;  Location: [facility];  Service: General;  Laterality: Right;   ENDOMETRIAL ABLATION     KNEE ARTHROSCOPY Right    TUBAL LIGATION     Patient Active Problem List   Diagnosis Date Noted   Insomnia 10/22/2022   Depression, recurrent (HCC) 03/03/2022   Mixed hyperlipidemia 03/03/2022   Gastroesophageal reflux disease 03/03/2022    PCP: Sharlene Dory, DO   REFERRING PROVIDER: Sharlene Dory, DO  REFERRING DIAG: M54.2 (ICD-10-CM) - Neck pain   THERAPY DIAG:  Cervicalgia  Abnormal posture  Muscle weakness (generalized)  Cervicogenic headache  Other muscle spasm  RATIONALE FOR EVALUATION AND TREATMENT: Rehabilitation  ONSET DATE: August 2024  NEXT MD VISIT: None scheduled / PRN   SUBJECTIVE:                                                                                                                                                                                                          SUBJECTIVE STATEMENT: Pt reports intermittent neck pain L>R which radiates into her head.  She reports occasional headaches.  Occasional L>R radicular pain into shoulder/upper arm but no numbness and tingling.  H/o L wrist fracture followed by L frozen shoulder in 2023.  She notes worsening posture which she would like to keep from progressing.  No particular  limitations with any activities reported.  Hand dominance: Right  PAIN: Are you having pain? No and Yes: NPRS scale: 0/10 currently, 5/10 at worst Pain location: L side of neck into head when present Pain description: stabbing when really bad, cramping otherwise  Aggravating factors: uncertain  Relieving factors: ice, heat, ibuprofen, naproxen   PERTINENT HISTORY:  B knee OA, a-fib, depression, insomnia, GERD, multiple breast biopsies in 2022 with R breast lumpectomy and radioactive seed localization 12/2020, L wrist fracture & frozen shoulder in 2023  PRECAUTIONS: None  RED FLAGS: None  HAND DOMINANCE: Right  WEIGHT BEARING RESTRICTIONS: No  FALLS:  Has patient fallen in last 6 months? No  LIVING ENVIRONMENT: Lives with: lives with their spouse Lives in: House/apartment  OCCUPATION: Retired  PLOF: Independent and Leisure: travel, gym 5x/wk - [employer], rec bike (1 hr) & TM (30 min), shopping, photography   PATIENT GOALS: "Eliminate neck pain and improve posture - be able to do more."   OBJECTIVE: (objective measures completed at initial evaluation unless otherwise dated)  DIAGNOSTIC FINDINGS:  N/A  PATIENT SURVEYS:  NDI 8 / 50 = 16.0 %  COGNITION: Overall cognitive status: Within functional limits for tasks assessed  SENSATION: WFL  POSTURE:  rounded shoulders, forward head, and increased thoracic kyphosis  PALPATION: Increased muscle tension and mild TTP in L>R UT, LS, rhomboids, teres group,  pecs   CERVICAL ROM:   Active ROM Eval  Flexion 40  Extension 45  Right lateral flexion 26 stretching  Left lateral flexion 25  Right rotation 70  Left rotation 71   (Blank rows = not tested)  UPPER EXTREMITY ROM: B UE WNL  UPPER EXTREMITY MMT:  MMT Right eval Left eval  Shoulder flexion 5 4+  Shoulder extension 5 5  Shoulder abduction 5 5  Shoulder adduction    Shoulder internal rotation 5 5  Shoulder external rotation 4+ 4+  Middle trapezius 4 4  Lower trapezius 3- 3-   (Blank rows = not tested)  CERVICAL SPECIAL TESTS:  Spurling's test: Negative and Distraction test: Negative   TODAY'S TREATMENT:   03/31/2023 - Eval SELF CARE:  Reviewed eval findings and role of PT in addressing identified deficits as well as instruction in initial HEP (see below).    PATIENT EDUCATION:  Education details: PT eval findings, anticipated POC, initial HEP, and postural awareness  Person educated: Patient Education method: Explanation, Demonstration, Verbal cues, and Handouts Education comprehension: verbalized understanding, returned demonstration, verbal cues required, and needs further education  HOME EXERCISE PROGRAM: Access Code: [email or web address] URL: [email or web address] Date: 03/31/2023 Prepared by: Glenetta Hew  Exercises - Supine Cervical Retraction with Towel  - 2 x daily - 7 x weekly - 2 sets - 10 reps - 5 sec hold - Seated Cervical Retraction  - 2 x daily - 7 x weekly - 2 sets - 10 reps - 5 sec hold - Seated Thoracic Lumbar Extension with Pectoralis Stretch  - 2 x daily - 7 x weekly - 2 sets - 10 reps - 5 sec hold - Seated Gentle Upper Trapezius Stretch  - 2 x daily - 7 x weekly - 3 reps - 30 sec hold - Gentle Levator Scapulae Stretch  - 2 x daily - 7 x weekly - 3 reps - 30 sec hold - Seated Scapular Retraction with External Rotation  - 2 x daily - 7 x weekly - 2 sets - 10 reps - 3 sec hold   ASSESSMENT:  CLINICAL IMPRESSION: Laura [place]  Shields is a 68 y.o.  female who was referred to physical therapy for evaluation and treatment for chronic neck pain.  Patient reports onset of chronic intermittent neck pain beginning in August 2024 w/o known MOI.  Pain radiates into her head with occasional headaches as well as L>R shoulder and upper arm but no numbness or tingling reported.  Patient has deficits in cervical flexion and extension ROM, cervical and upper shoulder flexibility, postural/scapular strength, abnormal posture, and TTP with abnormal muscle tension in L>R UT, LS, rhomboids, teres group, pecs which are interfering with ADLs and are impacting quality of life.  On NDI patient scored 8/50 demonstrating 16% or mild disability.  Shantasia will benefit from skilled PT to address above deficits to improve mobility and activity tolerance with decreased pain interference.  OBJECTIVE IMPAIRMENTS: decreased activity tolerance, decreased knowledge of condition, decreased ROM, decreased strength, hypomobility, increased fascial restrictions, impaired perceived functional ability, increased muscle spasms, impaired flexibility, impaired UE functional use, improper body mechanics, postural dysfunction, and pain.   ACTIVITY LIMITATIONS:  reading  PARTICIPATION LIMITATIONS: driving, community activity, and recreational activities  PERSONAL FACTORS: Past/current experiences, Time since onset of injury/illness/exacerbation, and 3+ comorbidities: B knee OA, a-fib, depression, insomnia, GERD, multiple breast biopsies in 2022 with R breast lumpectomy and radioactive seed localization 12/2020, L wrist fracture & frozen shoulder in 2023  are also affecting patient's functional outcome.   REHAB POTENTIAL: Excellent  CLINICAL DECISION MAKING: Stable/uncomplicated  EVALUATION COMPLEXITY: Low   GOALS: Goals reviewed with patient? Yes  SHORT TERM GOALS: Target date: 04/28/2023  Patient will be independent with initial HEP to improve outcomes and carryover.  Baseline:  Goal  status: INITIAL  2.  Patient will report 25% improvement in neck pain to improve QOL.  Baseline: 0/10 on eval, 5/10 at worst Goal status: INITIAL  LONG TERM GOALS: Target date: 05/12/2023  Patient will be independent with ongoing/advanced HEP for self-management at home.  Baseline:  Goal status: INITIAL  2.  Patient will demonstrate improved posture to decrease muscle imbalance. Baseline:  Goal status: INITIAL  3.  Patient will report 50-75% improvement in neck pain to improve QOL.  Baseline: 0/10 on eval, 5/10 at worst Goal status: INITIAL  4.  Patient to report 50-75% reduction in frequency and intensity of weekly headaches/migraines.   Baseline: Occasional moderate intensity headaches Goal status: INITIAL   5.  Patient will demonstrate full pain free cervical ROM for safety with driving.  Baseline:  Goal status: INITIAL  6.  Patient will report </= 8% on NDI to demonstrate improved functional ability.  Baseline: 8 / 50 = 16.0 % Goal status: INITIAL  PLAN:  PT FREQUENCY: 2x/week  PT DURATION: 8 weeks  PLANNED INTERVENTIONS: 97164- PT Re-evaluation, 97110-Therapeutic exercises, 97530- Therapeutic activity, 97112- Neuromuscular re-education, 97535- Self Care, 16109- Manual therapy, 97014- Electrical stimulation (unattended), [id number]- Electrical stimulation (manual), [id number]- Ultrasound, 60454- Traction (mechanical), [id number]- Ionotophoresis 4mg /ml Dexamethasone, Patient/Family education, Taping, Dry Needling, Joint mobilization, Spinal mobilization, Cryotherapy, and Moist heat  PLAN FOR NEXT SESSION: Review initial HEP; progress cervical and thoracic mobility/flexibility as well as postural and scapular strengthening; MT +/- TPDN to address abnormal muscle tension as indicated   Marry Guan, PT 03/31/2023, 5:26 PM

## 2023-03-31 ENCOUNTER — Ambulatory Visit: Payer: Medicare Other | Attending: Family Medicine | Admitting: Physical Therapy

## 2023-03-31 ENCOUNTER — Encounter: Payer: Self-pay | Admitting: Physical Therapy

## 2023-03-31 ENCOUNTER — Other Ambulatory Visit: Payer: Self-pay

## 2023-03-31 DIAGNOSIS — M6281 Muscle weakness (generalized): Secondary | ICD-10-CM | POA: Diagnosis not present

## 2023-03-31 DIAGNOSIS — G4486 Cervicogenic headache: Secondary | ICD-10-CM | POA: Insufficient documentation

## 2023-03-31 DIAGNOSIS — R293 Abnormal posture: Secondary | ICD-10-CM | POA: Diagnosis not present

## 2023-03-31 DIAGNOSIS — M62838 Other muscle spasm: Secondary | ICD-10-CM | POA: Diagnosis not present

## 2023-03-31 DIAGNOSIS — M542 Cervicalgia: Secondary | ICD-10-CM | POA: Insufficient documentation

## 2023-04-04 ENCOUNTER — Ambulatory Visit: Payer: Medicare Other | Admitting: Physical Therapy

## 2023-04-04 ENCOUNTER — Encounter: Payer: Self-pay | Admitting: Physical Therapy

## 2023-04-04 DIAGNOSIS — M542 Cervicalgia: Secondary | ICD-10-CM | POA: Diagnosis not present

## 2023-04-04 DIAGNOSIS — R293 Abnormal posture: Secondary | ICD-10-CM

## 2023-04-04 DIAGNOSIS — M6281 Muscle weakness (generalized): Secondary | ICD-10-CM

## 2023-04-04 DIAGNOSIS — M62838 Other muscle spasm: Secondary | ICD-10-CM | POA: Diagnosis not present

## 2023-04-04 DIAGNOSIS — G4486 Cervicogenic headache: Secondary | ICD-10-CM | POA: Diagnosis not present

## 2023-04-04 NOTE — Therapy (Signed)
OUTPATIENT PHYSICAL THERAPY TREATMENT   Patient Name: Laura Shields MRN: [id number] DOB:December 04, 1955, 68 y.o., female Today's Date: 04/04/2023   END OF SESSION:  PT End of Session - 04/04/23 0847     Visit Number 2    Date for PT Re-Evaluation 05/26/23    Authorization Type Blue Medicare & State [employer]    PT Start Time [phone]    PT Stop Time [phone]    PT Time Calculation (min) 42 min    Activity Tolerance Patient tolerated treatment well    Behavior During Therapy [facility] for tasks assessed/performed              Past Medical History:  Diagnosis Date   Arthritis    bil knees   Atrial fibrillation (HCC)    Depression    GERD (gastroesophageal reflux disease)    Sleep apnea    +Bipap nightly   Past Surgical History:  Procedure Laterality Date   BREAST BIOPSY Right 11/03/2020   BREAST BIOPSY Bilateral 11/2020   BREAST EXCISIONAL BIOPSY Right 12/2020   x2   BREAST LUMPECTOMY WITH RADIOACTIVE SEED LOCALIZATION Right 01/06/2021   Procedure: RIGHT BREAST LUMPECTOMY WITH RADIOACTIVE SEED LOCALIZATION X 2;  Surgeon: Harriette Bouillon, MD;  Location: [facility];  Service: General;  Laterality: Right;   ENDOMETRIAL ABLATION     KNEE ARTHROSCOPY Right    TUBAL LIGATION     Patient Active Problem List   Diagnosis Date Noted   Insomnia 10/22/2022   Depression, recurrent (HCC) 03/03/2022   Mixed hyperlipidemia 03/03/2022   Gastroesophageal reflux disease 03/03/2022    PCP: Sharlene Dory, DO   REFERRING PROVIDER: Sharlene Dory, DO  REFERRING DIAG: M54.2 (ICD-10-CM) - Neck pain   THERAPY DIAG:  Cervicalgia  Abnormal posture  Muscle weakness (generalized)  Cervicogenic headache  Other muscle spasm  RATIONALE FOR EVALUATION AND TREATMENT: Rehabilitation  ONSET DATE: August 2024  NEXT MD VISIT: None scheduled / PRN   SUBJECTIVE:                                                                                                                                                                                                          SUBJECTIVE STATEMENT: Patient reports the L side of her neck felt very stiff and tight while performing the HEP but no difficulty with the HEP.  EVAL:  Pt reports intermittent neck pain L>R which radiates into her head.  She reports occasional headaches.  Occasional L>R radicular pain into shoulder/upper arm but no numbness and tingling.  H/o  L wrist fracture followed by L frozen shoulder in 2023.  She notes worsening posture which she would like to keep from progressing.  No particular limitations with any activities reported.  Hand dominance: Right  PAIN: Are you having pain? No and Yes: NPRS scale: 0/10  Pain location: L side of neck into head when present Pain description: stabbing when really bad, cramping otherwise  Aggravating factors: uncertain  Relieving factors: ice, heat, ibuprofen, naproxen   PERTINENT HISTORY:  B knee OA, a-fib, depression, insomnia, GERD, multiple breast biopsies in 2022 with R breast lumpectomy and radioactive seed localization 12/2020, L wrist fracture & frozen shoulder in 2023  PRECAUTIONS: None  RED FLAGS: None  HAND DOMINANCE: Right  WEIGHT BEARING RESTRICTIONS: No  FALLS:  Has patient fallen in last 6 months? No  LIVING ENVIRONMENT: Lives with: lives with their spouse Lives in: House/apartment  OCCUPATION: Retired  PLOF: Independent and Leisure: travel, gym 5x/wk - [employer], rec bike (1 hr) & TM (30 min), shopping, photography   PATIENT GOALS: "Eliminate neck pain and improve posture - be able to do more."   OBJECTIVE: (objective measures completed at initial evaluation unless otherwise dated)  DIAGNOSTIC FINDINGS:  N/A  PATIENT SURVEYS:  NDI 8 / 50 = 16.0 %  COGNITION: Overall cognitive status: Within functional limits for tasks assessed  SENSATION: WFL  POSTURE:  rounded shoulders, forward head, and increased thoracic  kyphosis  PALPATION: Increased muscle tension and mild TTP in L>R UT, LS, rhomboids, teres group, pecs   CERVICAL ROM:   Active ROM Eval  Flexion 40  Extension 45  Right lateral flexion 26 stretching  Left lateral flexion 25  Right rotation 70  Left rotation 71   (Blank rows = not tested)  UPPER EXTREMITY ROM: B UE WNL  UPPER EXTREMITY MMT:  MMT Right eval Left eval  Shoulder flexion 5 4+  Shoulder extension 5 5  Shoulder abduction 5 5  Shoulder adduction    Shoulder internal rotation 5 5  Shoulder external rotation 4+ 4+  Middle trapezius 4 4  Lower trapezius 3- 3-   (Blank rows = not tested)  CERVICAL SPECIAL TESTS:  Spurling's test: Negative and Distraction test: Negative   TODAY'S TREATMENT:   04/04/2023 THERAPEUTIC EXERCISE: To improve strength, endurance, and flexibility.  Demonstration, verbal and tactile cues throughout for technique.  UBE - L1.0 x 6 min (3' each fwd & back) Seated cervical retraction 10 x 5" Seated thoracic extension + pec stretch with hands behind head leaning over back of chair 10 x 5" Seated gentle UT stretch 3 x 30" bil Seated gentle LS stretch 3 x 30" bil Seated scap retraction + B shoulder ER 10 x 3", 2nd set with RTB  MANUAL THERAPY: To promote normalized muscle tension, improved flexibility, and reduced pain. Trigger Point Dry Needling: Treatment instructions/education: Initial Treatment: Pt instructed on Dry Needling rational, procedures, and possible side effects. Pt instructed to expect mild to moderate muscle soreness later in the day and/or into the next day.  Pt instructed in methods to reduce muscle soreness. Pt instructed to continue prescribed HEP. Because Dry Needling was performed over or adjacent to a lung field, pt was educated on S/S of pneumothorax and to seek immediate medical attention should they occur.  Patient was educated on signs and symptoms of infection and other risk factors and advised to seek medical  attention should they occur.  Patient verbalized understanding of these instructions and education.  Education Handout Provided: Yes  Consent: Patient Verbal Consent Given: Yes Treatment: Muscles Treated: L UT and LS Skilled palpation and monitoring of soft tissue during DN Electrical Stimulation Performed: No Treatment Response/Outcome: Twitch Response Elicited, Palpable Increase in Muscle Length, Decreased TTP, and Improved Exercise Tolerance (less tightness with UT & LS stretches) STM/DTM, manual TPR and pin & stretch to muscles addressed with DN  NEUROMUSCULAR RE-EDUCATION: To improve kinesthetic sense and posture.  Standing RTB scap retraction + B shoulder horiz ABD 10 x 3", with back along doorframe to provide tactile cues for postural awareness and to facilitate increased scap retraction Standing RTB scap retraction + B shoulder alt horiz ABD diagonals 10 x 3", with back along doorframe to provide tactile cues for postural awareness and to facilitate increased scap retraction Standing RTB scap retraction + B shoulder row 10 x 3" Standing RTB scap retraction + B shoulder extension 10 x 3"   03/31/2023 - Eval SELF CARE:  Reviewed eval findings and role of PT in addressing identified deficits as well as instruction in initial HEP (see below).    PATIENT EDUCATION:  Education details: HEP review, HEP progression, postural awareness, role of DN, and DN rational, procedure, outcomes, potential side effects, and recommended post-treatment exercises/activity   Person educated: Patient Education method: Explanation, Demonstration, Verbal cues, and Handouts Education comprehension: verbalized understanding, returned demonstration, verbal cues required, and needs further education  HOME EXERCISE PROGRAM: Access Code: [email or web address] URL: [email or web address] Date: 04/04/2023 Prepared by: Glenetta Hew  Exercises - Supine Cervical Retraction with Towel  - 2 x daily - 7 x weekly -  2 sets - 10 reps - 5 sec hold - Seated Cervical Retraction  - 2 x daily - 7 x weekly - 2 sets - 10 reps - 5 sec hold - Seated Thoracic Lumbar Extension with Pectoralis Stretch  - 2 x daily - 7 x weekly - 2 sets - 10 reps - 5 sec hold - Seated Gentle Upper Trapezius Stretch  - 2 x daily - 7 x weekly - 3 reps - 30 sec hold - Gentle Levator Scapulae Stretch  - 2 x daily - 7 x weekly - 3 reps - 30 sec hold - Shoulder External Rotation and Scapular Retraction with Resistance  - 1 x daily - 7 x weekly - 2 sets - 10 reps - 3-5 sec hold - Standing Shoulder Horizontal Abduction with Resistance  - 1 x daily - 7 x weekly - 2 sets - 10 reps - 3 sec hold - Standing Shoulder Diagonal Horizontal Abduction 60/120 Degrees with Resistance  - 1 x daily - 7 x weekly - 2 sets - 10 reps - 3 sec hold  Patient Education - Trigger Point Dry Needling   ASSESSMENT:  CLINICAL IMPRESSION: Taraji reports no issues with the initial HEP but does note a much stronger stretch on the L vs R.  HEP reviewed with minor clarifications provided at patient request, but otherwise good return demonstration.  Significant increased muscle tension with TTP/taut bands identified in L UT and LS which appeared amenable to TPDN.  After explanation of DN rational, procedures, outcomes and potential side effects, including precautions with DN over the lung fields, patient verbalized consent to DN treatment in conjunction with manual STM/DTM and TPR to reduce ttp/muscle tension. Muscles treated as indicated above. DN produced normal response with good twitches elicited resulting in palpable reduction in pain/ttp and muscle tension, with patient reporting decreased tightness during UT and LS stretches. Pt educated to expect mild to moderate muscle  soreness for up to 24-48 hrs and instructed to continue prescribed HEP and current activity level with pt verbalizing understanding of these instructions.  Progressed postural strengthening adding RTB resisted  scapular strengthening exercises, with HEP updated accordingly.  Mayetta will benefit from continued skilled PT to address ongoing deficits to improve mobility and activity tolerance with decreased pain interference.  OBJECTIVE IMPAIRMENTS: decreased activity tolerance, decreased knowledge of condition, decreased ROM, decreased strength, hypomobility, increased fascial restrictions, impaired perceived functional ability, increased muscle spasms, impaired flexibility, impaired UE functional use, improper body mechanics, postural dysfunction, and pain.   ACTIVITY LIMITATIONS:  reading  PARTICIPATION LIMITATIONS: driving, community activity, and recreational activities  PERSONAL FACTORS: Past/current experiences, Time since onset of injury/illness/exacerbation, and 3+ comorbidities: B knee OA, a-fib, depression, insomnia, GERD, multiple breast biopsies in 2022 with R breast lumpectomy and radioactive seed localization 12/2020, L wrist fracture & frozen shoulder in 2023  are also affecting patient's functional outcome.   REHAB POTENTIAL: Excellent  CLINICAL DECISION MAKING: Stable/uncomplicated  EVALUATION COMPLEXITY: Low   GOALS: Goals reviewed with patient? Yes  SHORT TERM GOALS: Target date: 04/28/2023  Patient will be independent with initial HEP to improve outcomes and carryover.  Baseline:  Goal status: IN PROGRESS - 04/04/23 - reviewed with minor clarifications today  2.  Patient will report 25% improvement in neck pain to improve QOL.  Baseline: 0/10 on eval, 5/10 at worst Goal status: IN PROGRESS  LONG TERM GOALS: Target date: 05/12/2023  Patient will be independent with ongoing/advanced HEP for self-management at home.  Baseline:  Goal status: IN PROGRESS  2.  Patient will demonstrate improved posture to decrease muscle imbalance. Baseline:  Goal status: IN PROGRESS  3.  Patient will report 50-75% improvement in neck pain to improve QOL.  Baseline: 0/10 on eval, 5/10 at  worst Goal status: IN PROGRESS  4.  Patient to report 50-75% reduction in frequency and intensity of weekly headaches/migraines.   Baseline: Occasional moderate intensity headaches Goal status: IN PROGRESS   5.  Patient will demonstrate full pain free cervical ROM for safety with driving.  Baseline:  Goal status: IN PROGRESS  6.  Patient will report </= 8% on NDI to demonstrate improved functional ability.  Baseline: 8 / 50 = 16.0 % Goal status: IN PROGRESS  PLAN:  PT FREQUENCY: 2x/week  PT DURATION: 8 weeks  PLANNED INTERVENTIONS: 97164- PT Re-evaluation, 97110-Therapeutic exercises, 97530- Therapeutic activity, 97112- Neuromuscular re-education, 97535- Self Care, 46962- Manual therapy, 97014- Electrical stimulation (unattended), [id number]- Electrical stimulation (manual), [id number]- Ultrasound, 95284- Traction (mechanical), [id number]- Ionotophoresis 4mg /ml Dexamethasone, Patient/Family education, Taping, Dry Needling, Joint mobilization, Spinal mobilization, Cryotherapy, and Moist heat  PLAN FOR NEXT SESSION: Assess response to TPDN; progress cervical and thoracic mobility/flexibility as well as postural and scapular strengthening - review/update HEP PRN; MT +/- TPDN to address abnormal muscle tension as indicated   Marry Guan, PT 04/04/2023, 9:31 AM

## 2023-04-04 NOTE — Patient Instructions (Signed)

## 2023-04-06 ENCOUNTER — Encounter: Payer: Self-pay | Admitting: Physical Therapy

## 2023-04-06 ENCOUNTER — Ambulatory Visit: Payer: Medicare Other | Admitting: Physical Therapy

## 2023-04-06 DIAGNOSIS — M6281 Muscle weakness (generalized): Secondary | ICD-10-CM | POA: Diagnosis not present

## 2023-04-06 DIAGNOSIS — M542 Cervicalgia: Secondary | ICD-10-CM | POA: Diagnosis not present

## 2023-04-06 DIAGNOSIS — M62838 Other muscle spasm: Secondary | ICD-10-CM

## 2023-04-06 DIAGNOSIS — R293 Abnormal posture: Secondary | ICD-10-CM | POA: Diagnosis not present

## 2023-04-06 DIAGNOSIS — G4486 Cervicogenic headache: Secondary | ICD-10-CM

## 2023-04-06 NOTE — Therapy (Signed)
OUTPATIENT PHYSICAL THERAPY TREATMENT   Patient Name: Laura Shields MRN: [id number] DOB:Apr 10, 1955, 68 y.o., female Today's Date: 04/06/2023   END OF SESSION:  PT End of Session - 04/06/23 0847     Visit Number 3    Date for PT Re-Evaluation 05/26/23    Authorization Type [employer] & State [employer]    PT Start Time [phone]    PT Stop Time 0935    PT Time Calculation (min) 48 min    Activity Tolerance Patient tolerated treatment well    Behavior During Therapy [facility] for tasks assessed/performed               Past Medical History:  Diagnosis Date   Arthritis    bil knees   Atrial fibrillation (HCC)    Depression    GERD (gastroesophageal reflux disease)    Sleep apnea    +Bipap nightly   Past Surgical History:  Procedure Laterality Date   BREAST BIOPSY Right 11/03/2020   BREAST BIOPSY Bilateral 11/2020   BREAST EXCISIONAL BIOPSY Right 12/2020   x2   BREAST LUMPECTOMY WITH RADIOACTIVE SEED LOCALIZATION Right 01/06/2021   Procedure: RIGHT BREAST LUMPECTOMY WITH RADIOACTIVE SEED LOCALIZATION X 2;  Surgeon: Harriette Bouillon, MD;  Location: [facility];  Service: General;  Laterality: Right;   ENDOMETRIAL ABLATION     KNEE ARTHROSCOPY Right    TUBAL LIGATION     Patient Active Problem List   Diagnosis Date Noted   Insomnia 10/22/2022   Depression, recurrent (HCC) 03/03/2022   Mixed hyperlipidemia 03/03/2022   Gastroesophageal reflux disease 03/03/2022    PCP: Sharlene Dory, DO   REFERRING PROVIDER: Sharlene Dory, DO  REFERRING DIAG: M54.2 (ICD-10-CM) - Neck pain   THERAPY DIAG:  Cervicalgia  Abnormal posture  Muscle weakness (generalized)  Cervicogenic headache  Other muscle spasm  RATIONALE FOR EVALUATION AND TREATMENT: Rehabilitation  ONSET DATE: August 2024  NEXT MD VISIT: None scheduled / PRN   SUBJECTIVE:                                                                                                                                                                                                          SUBJECTIVE STATEMENT: Patient reports a little benefit from the DN last visit. No pain currently but still tight L>R but more aware of R than before DN.  EVAL:  Pt reports intermittent neck pain L>R which radiates into her head.  She reports occasional headaches.  Occasional L>R radicular pain into shoulder/upper arm but no numbness and  tingling.  H/o L wrist fracture followed by L frozen shoulder in 2023.  She notes worsening posture which she would like to keep from progressing.  No particular limitations with any activities reported.  Hand dominance: Right  PAIN: Are you having pain? No and Yes: NPRS scale: 0/10  Pain location: L side of neck into head when present Pain description: stabbing when really bad, cramping otherwise  Aggravating factors: uncertain  Relieving factors: ice, heat, ibuprofen, naproxen   PERTINENT HISTORY:  B knee OA, a-fib, depression, insomnia, GERD, multiple breast biopsies in 2022 with R breast lumpectomy and radioactive seed localization 12/2020, L wrist fracture & frozen shoulder in 2023  PRECAUTIONS: None  RED FLAGS: None  HAND DOMINANCE: Right  WEIGHT BEARING RESTRICTIONS: No  FALLS:  Has patient fallen in last 6 months? No  LIVING ENVIRONMENT: Lives with: lives with their spouse Lives in: House/apartment  OCCUPATION: Retired  PLOF: Independent and Leisure: travel, gym 5x/wk - [employer], rec bike (1 hr) & TM (30 min), shopping, photography   PATIENT GOALS: "Eliminate neck pain and improve posture - be able to do more."   OBJECTIVE: (objective measures completed at initial evaluation unless otherwise dated)  DIAGNOSTIC FINDINGS:  N/A  PATIENT SURVEYS:  NDI 8 / 50 = 16.0 %  COGNITION: Overall cognitive status: Within functional limits for tasks assessed  SENSATION: WFL  POSTURE:  rounded shoulders, forward head, and increased  thoracic kyphosis  PALPATION: Increased muscle tension and mild TTP in L>R UT, LS, rhomboids, teres group, pecs   CERVICAL ROM:   Active ROM Eval  Flexion 40  Extension 45  Right lateral flexion 26 stretching  Left lateral flexion 25  Right rotation 70  Left rotation 71   (Blank rows = not tested)  UPPER EXTREMITY ROM: B UE WNL  UPPER EXTREMITY MMT:  MMT Right eval Left eval  Shoulder flexion 5 4+  Shoulder extension 5 5  Shoulder abduction 5 5  Shoulder adduction    Shoulder internal rotation 5 5  Shoulder external rotation 4+ 4+  Middle trapezius 4 4  Lower trapezius 3- 3-   (Blank rows = not tested)  CERVICAL SPECIAL TESTS:  Spurling's test: Negative and Distraction test: Negative   TODAY'S TREATMENT:   04/06/2023 THERAPEUTIC EXERCISE: To improve strength, endurance, and flexibility.  Demonstration, verbal and tactile cues throughout for technique.  UBE - L2.0 x 6 min (3' each fwd & back) Supine cervical PROM and gentle UT, LS and scalene stretches Seated gentle UT stretch 3 x 30" bil Seated gentle LS stretch 3 x 30" bil Seated 3-way scalene stretch with towel over shoulder 3 x 30" bil Cervical extension SNAG with rolled pillowcase 10 x 3" Hooklying pec stretch over pool noodle x 60"  MANUAL THERAPY: To promote normalized muscle tension, improved flexibility, improved joint mobility, increased ROM, and reduced pain. Trigger Point Dry Needling: Treatment instructions/education: Subsequent Treatment: Instructions provided previously at initial dry needling treatment.  Education Handout Provided: Previously Provided Consent: Patient Verbal Consent Given: Yes Treatment: Muscles Treated: B UT, LS, cervical multifidi & paraspinals, L scalenes Skilled palpation and monitoring of soft tissue during DN Electrical Stimulation Performed: No Treatment Response/Outcome: Twitch Response Elicited, Palpable Increase in Muscle Length, Decreased TTP, and Improved Exercise  Tolerance STM/DTM, manual TPR and pin & stretch to muscles addressed with DN   04/04/2023 THERAPEUTIC EXERCISE: To improve strength, endurance, and flexibility.  Demonstration, verbal and tactile cues throughout for technique.  UBE - L1.0 x 6 min (  3' each fwd & back) Seated cervical retraction 10 x 5" Seated thoracic extension + pec stretch with hands behind head leaning over back of chair 10 x 5" Seated gentle UT stretch 3 x 30" bil Seated gentle LS stretch 3 x 30" bil Seated scap retraction + B shoulder ER 10 x 3", 2nd set with RTB  MANUAL THERAPY: To promote normalized muscle tension, improved flexibility, and reduced pain. Trigger Point Dry Needling: Treatment instructions/education: Initial Treatment: Pt instructed on Dry Needling rational, procedures, and possible side effects. Pt instructed to expect mild to moderate muscle soreness later in the day and/or into the next day.  Pt instructed in methods to reduce muscle soreness. Pt instructed to continue prescribed HEP. Because Dry Needling was performed over or adjacent to a lung field, pt was educated on S/S of pneumothorax and to seek immediate medical attention should they occur.  Patient was educated on signs and symptoms of infection and other risk factors and advised to seek medical attention should they occur.  Patient verbalized understanding of these instructions and education.  Education Handout Provided: Yes Consent: Patient Verbal Consent Given: Yes Treatment: Muscles Treated: L UT and LS Skilled palpation and monitoring of soft tissue during DN Electrical Stimulation Performed: No Treatment Response/Outcome: Twitch Response Elicited, Palpable Increase in Muscle Length, Decreased TTP, and Improved Exercise Tolerance (less tightness with UT & LS stretches) STM/DTM, manual TPR and pin & stretch to muscles addressed with DN  NEUROMUSCULAR RE-EDUCATION: To improve kinesthetic sense and posture.  Standing RTB scap  retraction + B shoulder horiz ABD 10 x 3", with back along doorframe to provide tactile cues for postural awareness and to facilitate increased scap retraction Standing RTB scap retraction + B shoulder alt horiz ABD diagonals 10 x 3", with back along doorframe to provide tactile cues for postural awareness and to facilitate increased scap retraction Standing RTB scap retraction + B shoulder row 10 x 3" Standing RTB scap retraction + B shoulder extension 10 x 3"   03/31/2023 - Eval SELF CARE:  Reviewed eval findings and role of PT in addressing identified deficits as well as instruction in initial HEP (see below).    PATIENT EDUCATION:  Education details: HEP progression, postural awareness, role of DN, and DN rational, procedure, outcomes, potential side effects, and recommended post-treatment exercises/activity   Person educated: Patient Education method: Explanation, Demonstration, Verbal cues, and Handouts Education comprehension: verbalized understanding, returned demonstration, verbal cues required, and needs further education  HOME EXERCISE PROGRAM: Access Code: [email or web address] URL: [email or web address] Date: 04/06/2023 Prepared by: Glenetta Hew  Exercises - Supine Cervical Retraction with Towel  - 2 x daily - 7 x weekly - 2 sets - 10 reps - 5 sec hold - Seated Cervical Retraction  - 2 x daily - 7 x weekly - 2 sets - 10 reps - 5 sec hold - Seated Thoracic Lumbar Extension with Pectoralis Stretch  - 2 x daily - 7 x weekly - 2 sets - 10 reps - 5 sec hold - Seated Gentle Upper Trapezius Stretch  - 2 x daily - 7 x weekly - 3 reps - 30 sec hold - Gentle Levator Scapulae Stretch  - 2 x daily - 7 x weekly - 3 reps - 30 sec hold - Shoulder External Rotation and Scapular Retraction with Resistance  - 1 x daily - 7 x weekly - 2 sets - 10 reps - 3-5 sec hold - Standing Shoulder Horizontal Abduction with Resistance  - 1 x daily -  7 x weekly - 2 sets - 10 reps - 3 sec hold - Standing  Shoulder Diagonal Horizontal Abduction 60/120 Degrees with Resistance  - 1 x daily - 7 x weekly - 2 sets - 10 reps - 3 sec hold - Seated Scalene Stretch with Towel  - 2 x daily - 7 x weekly - 3 reps - 30 sec hold - Mid-Lower Cervical Extension SNAG with Strap  - 1 x daily - 7 x weekly - 2 sets - 10 reps - 3 sec hold  Patient Education - Trigger Point Dry Needling   ASSESSMENT:  CLINICAL IMPRESSION: July reports benefit from initial session of TPDN, but now noting more awareness of R-sided tightness/muscle tension although still not as tight as on L.  Continued with MT incorporating TPDN to address B neck and upper shoulder abnormal muscle tension/tightness following by PROM/stretching and progression of home stretches and AAROM to promote more neutral alignment and normalize muscle tension.  Patient will not be able to attend PT next week, but will plan to progress postural strengthening upon her return the following week.  Elliona will benefit from continued skilled PT to address ongoing deficits to improve mobility and activity tolerance with decreased pain interference.  OBJECTIVE IMPAIRMENTS: decreased activity tolerance, decreased knowledge of condition, decreased ROM, decreased strength, hypomobility, increased fascial restrictions, impaired perceived functional ability, increased muscle spasms, impaired flexibility, impaired UE functional use, improper body mechanics, postural dysfunction, and pain.   ACTIVITY LIMITATIONS:  reading  PARTICIPATION LIMITATIONS: driving, community activity, and recreational activities  PERSONAL FACTORS: Past/current experiences, Time since onset of injury/illness/exacerbation, and 3+ comorbidities: B knee OA, a-fib, depression, insomnia, GERD, multiple breast biopsies in 2022 with R breast lumpectomy and radioactive seed localization 12/2020, L wrist fracture & frozen shoulder in 2023  are also affecting patient's functional outcome.   REHAB POTENTIAL:  Excellent  CLINICAL DECISION MAKING: Stable/uncomplicated  EVALUATION COMPLEXITY: Low   GOALS: Goals reviewed with patient? Yes  SHORT TERM GOALS: Target date: 04/28/2023  Patient will be independent with initial HEP to improve outcomes and carryover.  Baseline:  Goal status: IN PROGRESS - 04/04/23 - reviewed with minor clarifications today  2.  Patient will report 25% improvement in neck pain to improve QOL.  Baseline: 0/10 on eval, 5/10 at worst Goal status: IN PROGRESS  LONG TERM GOALS: Target date: 05/12/2023  Patient will be independent with ongoing/advanced HEP for self-management at home.  Baseline:  Goal status: IN PROGRESS  2.  Patient will demonstrate improved posture to decrease muscle imbalance. Baseline:  Goal status: IN PROGRESS  3.  Patient will report 50-75% improvement in neck pain to improve QOL.  Baseline: 0/10 on eval, 5/10 at worst Goal status: IN PROGRESS  4.  Patient to report 50-75% reduction in frequency and intensity of weekly headaches/migraines.   Baseline: Occasional moderate intensity headaches Goal status: IN PROGRESS   5.  Patient will demonstrate full pain free cervical ROM for safety with driving.  Baseline:  Goal status: IN PROGRESS  6.  Patient will report </= 8% on NDI to demonstrate improved functional ability.  Baseline: 8 / 50 = 16.0 % Goal status: IN PROGRESS  PLAN:  PT FREQUENCY: 2x/week  PT DURATION: 8 weeks  PLANNED INTERVENTIONS: 97164- PT Re-evaluation, 97110-Therapeutic exercises, 97530- Therapeutic activity, 97112- Neuromuscular re-education, 97535- Self Care, 16109- Manual therapy, 97014- Electrical stimulation (unattended), [id number]- Electrical stimulation (manual), [id number]- Ultrasound, [id number]- Traction (mechanical), [id number]- Ionotophoresis 4mg /ml Dexamethasone, Patient/Family education, Taping, Dry Needling, Joint mobilization, Spinal mobilization,  Cryotherapy, and Moist heat  PLAN FOR NEXT SESSION: Assess response to TPDN;  progress cervical and thoracic mobility/flexibility as well as postural and scapular strengthening - review/update HEP PRN; MT +/- TPDN to address abnormal muscle tension as indicated   Marry Guan, PT 04/06/2023, 11:41 AM

## 2023-04-19 ENCOUNTER — Encounter: Payer: Self-pay | Admitting: Physical Therapy

## 2023-04-19 ENCOUNTER — Ambulatory Visit: Payer: Medicare Other | Attending: Family Medicine | Admitting: Physical Therapy

## 2023-04-19 DIAGNOSIS — M6281 Muscle weakness (generalized): Secondary | ICD-10-CM | POA: Diagnosis not present

## 2023-04-19 DIAGNOSIS — R293 Abnormal posture: Secondary | ICD-10-CM | POA: Insufficient documentation

## 2023-04-19 DIAGNOSIS — M62838 Other muscle spasm: Secondary | ICD-10-CM | POA: Insufficient documentation

## 2023-04-19 DIAGNOSIS — G4486 Cervicogenic headache: Secondary | ICD-10-CM | POA: Diagnosis not present

## 2023-04-19 DIAGNOSIS — M542 Cervicalgia: Secondary | ICD-10-CM | POA: Insufficient documentation

## 2023-04-19 NOTE — Therapy (Signed)
 OUTPATIENT PHYSICAL THERAPY TREATMENT   Patient Name: Laura Shields MRN: [id number] DOB:[birth year], 68 y.o., female Today's Date: 04/19/2023   END OF SESSION:      Past Medical History:  Diagnosis Date   Arthritis    bil knees   Atrial fibrillation (HCC)    Depression    GERD (gastroesophageal reflux disease)    Sleep apnea    +Bipap nightly   Past Surgical History:  Procedure Laterality Date   BREAST BIOPSY Right 11/03/2020   BREAST BIOPSY Bilateral 11/2020   BREAST EXCISIONAL BIOPSY Right 12/2020   x2   BREAST LUMPECTOMY WITH RADIOACTIVE SEED LOCALIZATION Right 01/06/2021   Procedure: RIGHT BREAST LUMPECTOMY WITH RADIOACTIVE SEED LOCALIZATION X 2;  Surgeon: Harriette Bouillon, MD;  Location: [facility];  Service: General;  Laterality: Right;   ENDOMETRIAL ABLATION     KNEE ARTHROSCOPY Right    TUBAL LIGATION     Patient Active Problem List   Diagnosis Date Noted   Insomnia 10/22/2022   Depression, recurrent (HCC) 03/03/2022   Mixed hyperlipidemia 03/03/2022   Gastroesophageal reflux disease 03/03/2022    PCP: Sharlene Dory, DO   REFERRING PROVIDER: Sharlene Dory, DO  REFERRING DIAG: M54.2 (ICD-10-CM) - Neck pain   THERAPY DIAG:  No diagnosis found.  RATIONALE FOR EVALUATION AND TREATMENT: Rehabilitation  ONSET DATE: August 2024  NEXT MD VISIT: None scheduled / PRN   SUBJECTIVE:                                                                                                                                                                                                         SUBJECTIVE STATEMENT: Patient reports for the last week she has been in [place] packing up her mom and dad's house, so doing a lot of lifting and has not been good about her HEP.  EVAL:  Pt reports intermittent neck pain L>R which radiates into her head.  She reports occasional headaches.  Occasional L>R radicular pain into shoulder/upper arm but no  numbness and tingling.  H/o L wrist fracture followed by L frozen shoulder in 2023.  She notes worsening posture which she would like to keep from progressing.  No particular limitations with any activities reported.  Hand dominance: Right  PAIN: Are you having pain? No and Yes: NPRS scale: 1-2/10  Pain location: L side of neck into head when present Pain description: more tightness than pain   Aggravating factors: uncertain  Relieving factors: ice, heat, ibuprofen, naproxen   PERTINENT HISTORY:  B knee OA, a-fib, depression,  insomnia, GERD, multiple breast biopsies in 2022 with R breast lumpectomy and radioactive seed localization 12/2020, L wrist fracture & frozen shoulder in 2023  PRECAUTIONS: None  RED FLAGS: None  HAND DOMINANCE: Right  WEIGHT BEARING RESTRICTIONS: No  FALLS:  Has patient fallen in last 6 months? No  LIVING ENVIRONMENT: Lives with: lives with their spouse Lives in: House/apartment  OCCUPATION: Retired  PLOF: Independent and Leisure: travel, gym 5x/wk - [employer], rec bike (1 hr) & TM (30 min), shopping, photography   PATIENT GOALS: "Eliminate neck pain and improve posture - be able to do more."   OBJECTIVE: (objective measures completed at initial evaluation unless otherwise dated)  DIAGNOSTIC FINDINGS:  N/A  PATIENT SURVEYS:  NDI 8 / 50 = 16.0 %  COGNITION: Overall cognitive status: Within functional limits for tasks assessed  SENSATION: WFL  POSTURE:  rounded shoulders, forward head, and increased thoracic kyphosis  PALPATION: Increased muscle tension and mild TTP in L>R UT, LS, rhomboids, teres group, pecs   CERVICAL ROM:   Active ROM Eval  Flexion 40  Extension 45  Right lateral flexion 26 stretching  Left lateral flexion 25  Right rotation 70  Left rotation 71   (Blank rows = not tested)  UPPER EXTREMITY ROM: B UE WNL  UPPER EXTREMITY MMT:  MMT Right eval Left eval  Shoulder flexion 5 4+  Shoulder extension 5  5  Shoulder abduction 5 5  Shoulder adduction    Shoulder internal rotation 5 5  Shoulder external rotation 4+ 4+  Middle trapezius 4 4  Lower trapezius 3- 3-   (Blank rows = not tested)  CERVICAL SPECIAL TESTS:  Spurling's test: Negative and Distraction test: Negative   TODAY'S TREATMENT:   04/19/2023  THERAPEUTIC EXERCISE: To improve strength and endurance.  Demonstration, verbal and tactile cues throughout for technique.  UBE - L2.0 x 6 min (3' each fwd & back) YTB resisted cervical retraction 10 x 3" POE cervical retraction x 10  MANUAL THERAPY: To promote normalized muscle tension, improved flexibility, improved joint mobility, increased ROM, and reduced pain. Trigger Point Dry Needling: Treatment instructions/education: Subsequent Treatment: Instructions provided previously at initial dry needling treatment.  Education Handout Provided: Previously Provided Consent: Patient Verbal Consent Given: Yes Treatment: Muscles Treated: L UT and scalenes Skilled palpation and monitoring of soft tissue during DN Electrical Stimulation Performed: No Treatment Response/Outcome: Twitch Response Elicited, Palpable Increase in Muscle Length, Decreased TTP, and Improved Exercise Tolerance STM/DTM, manual TPR and pin & stretch to muscles addressed with DN  NEUROMUSCULAR RE-EDUCATION: To improve kinesthetic sense and posture.  Standing GTB scap retraction + B shoulder ER 10 x 3", with back along doorframe to provide tactile cues for postural awareness and to facilitate increased scap retraction Standing GTB scap retraction + B shoulder horiz ABD 10 x 3", with back along doorframe to provide tactile cues for postural awareness and to facilitate increased scap retraction Standing GTB scap retraction + B shoulder alt horiz ABD diagonals 10 x 3", with back along doorframe to provide tactile cues for postural awareness and to facilitate increased scap retraction Standing GTB scap retraction + B  shoulder row 10 x 3" Standing GTB scap retraction + B shoulder extension 10 x 3" Lower trap setting at wall - Y wall slide with slight lift-off at terminal ROM 2 x 10, 2nd set adding looped YTB at wrists Serratus wall clocks with looped YTB at wrists x 10 Wall pushup plus x 10   04/06/2023 THERAPEUTIC EXERCISE:  To improve strength, endurance, and flexibility.  Demonstration, verbal and tactile cues throughout for technique.  UBE - L2.0 x 6 min (3' each fwd & back) Supine cervical PROM and gentle UT, LS and scalene stretches Seated gentle UT stretch 3 x 30" bil Seated gentle LS stretch 3 x 30" bil Seated 3-way scalene stretch with towel over shoulder 3 x 30" bil Cervical extension SNAG with rolled pillowcase 10 x 3" Hooklying pec stretch over pool noodle x 60"  MANUAL THERAPY: To promote normalized muscle tension, improved flexibility, improved joint mobility, increased ROM, and reduced pain. Trigger Point Dry Needling: Treatment instructions/education: Subsequent Treatment: Instructions provided previously at initial dry needling treatment.  Education Handout Provided: Previously Provided Consent: Patient Verbal Consent Given: Yes Treatment: Muscles Treated: B UT, LS, cervical multifidi & paraspinals, L scalenes Skilled palpation and monitoring of soft tissue during DN Electrical Stimulation Performed: No Treatment Response/Outcome: Twitch Response Elicited, Palpable Increase in Muscle Length, Decreased TTP, and Improved Exercise Tolerance STM/DTM, manual TPR and pin & stretch to muscles addressed with DN   04/04/2023 THERAPEUTIC EXERCISE: To improve strength, endurance, and flexibility.  Demonstration, verbal and tactile cues throughout for technique.  UBE - L1.0 x 6 min (3' each fwd & back) Seated cervical retraction 10 x 5" Seated thoracic extension + pec stretch with hands behind head leaning over back of chair 10 x 5" Seated gentle UT stretch 3 x 30" bil Seated gentle LS  stretch 3 x 30" bil Seated scap retraction + B shoulder ER 10 x 3", 2nd set with RTB  MANUAL THERAPY: To promote normalized muscle tension, improved flexibility, and reduced pain. Trigger Point Dry Needling: Treatment instructions/education: Initial Treatment: Pt instructed on Dry Needling rational, procedures, and possible side effects. Pt instructed to expect mild to moderate muscle soreness later in the day and/or into the next day.  Pt instructed in methods to reduce muscle soreness. Pt instructed to continue prescribed HEP. Because Dry Needling was performed over or adjacent to a lung field, pt was educated on S/S of pneumothorax and to seek immediate medical attention should they occur.  Patient was educated on signs and symptoms of infection and other risk factors and advised to seek medical attention should they occur.  Patient verbalized understanding of these instructions and education.  Education Handout Provided: Yes Consent: Patient Verbal Consent Given: Yes Treatment: Muscles Treated: L UT and LS Skilled palpation and monitoring of soft tissue during DN Electrical Stimulation Performed: No Treatment Response/Outcome: Twitch Response Elicited, Palpable Increase in Muscle Length, Decreased TTP, and Improved Exercise Tolerance (less tightness with UT & LS stretches) STM/DTM, manual TPR and pin & stretch to muscles addressed with DN  NEUROMUSCULAR RE-EDUCATION: To improve kinesthetic sense and posture.  Standing RTB scap retraction + B shoulder horiz ABD 10 x 3", with back along doorframe to provide tactile cues for postural awareness and to facilitate increased scap retraction Standing RTB scap retraction + B shoulder alt horiz ABD diagonals 10 x 3", with back along doorframe to provide tactile cues for postural awareness and to facilitate increased scap retraction Standing RTB scap retraction + B shoulder row 10 x 3" Standing RTB scap retraction + B shoulder extension 10 x  3"   PATIENT EDUCATION:  Education details: HEP progression, postural awareness, role of DN, and DN rational, procedure, outcomes, potential side effects, and recommended post-treatment exercises/activity   Person educated: Patient Education method: Explanation, Demonstration, Verbal cues, and Handouts Education comprehension: verbalized understanding, returned demonstration, verbal cues required,  and needs further education  HOME EXERCISE PROGRAM: Access Code: [email or web address] URL: [email or web address] Date: 04/19/2023 Prepared by: Glenetta Hew  Exercises - Supine Cervical Retraction with Towel  - 2 x daily - 7 x weekly - 2 sets - 10 reps - 5 sec hold - Seated Cervical Retraction  - 2 x daily - 7 x weekly - 2 sets - 10 reps - 5 sec hold - Seated Thoracic Lumbar Extension with Pectoralis Stretch  - 2 x daily - 7 x weekly - 2 sets - 10 reps - 5 sec hold - Seated Gentle Upper Trapezius Stretch  - 2 x daily - 7 x weekly - 3 reps - 30 sec hold - Gentle Levator Scapulae Stretch  - 2 x daily - 7 x weekly - 3 reps - 30 sec hold - Shoulder External Rotation and Scapular Retraction with Resistance  - 1 x daily - 3 x weekly - 2 sets - 10 reps - 3-5 sec hold - Standing Shoulder Horizontal Abduction with Resistance  - 1 x daily - 3 x weekly - 2 sets - 10 reps - 3 sec hold - Standing Shoulder Diagonal Horizontal Abduction 60/120 Degrees with Resistance  - 1 x daily - 3 x weekly - 2 sets - 10 reps - 3 sec hold - Seated Scalene Stretch with Towel  - 2 x daily - 7 x weekly - 3 reps - 30 sec hold - Mid-Lower Cervical Extension SNAG with Strap  - 1 x daily - 7 x weekly - 2 sets - 10 reps - 3 sec hold - Cervical Retraction Prone on Elbows  - 1 x daily - 7 x weekly - 2 sets - 10 reps - 3 sec hold - Wall Push Up with Plus  - 1 x daily - 7 x weekly - 2 sets - 10 reps - 3 sec hold - Wall Clock with Theraband  - 1 x daily - 3 x weekly - 2 sets - 10 reps - 3 sec hold  Patient Education - Trigger Point  Dry Needling   ASSESSMENT:  CLINICAL IMPRESSION: Laura Shields reports 40% improvement in pain since start of PT with continued benefit from latest session of TPDN.  States the stretch with the pool noodle seems to help.  She has been trying to focus on her posture and her husband has noted the difference.  Minimal pain today, mostly tightness which was addressed with continued MT incorporating TPDN to L neck and upper shoulder musculature.  Progressed cervical and scapular strengthening/stabilization to facilitate improved postural control with good tolerance.  HEP updated to reflect exercise progression.  Laura Shields is progressing well toward her PT goals with STGs now met.  She will benefit from continued skilled PT to address ongoing deficits to improve posture, mobility and activity tolerance with decreased pain interference.  OBJECTIVE IMPAIRMENTS: decreased activity tolerance, decreased knowledge of condition, decreased ROM, decreased strength, hypomobility, increased fascial restrictions, impaired perceived functional ability, increased muscle spasms, impaired flexibility, impaired UE functional use, improper body mechanics, postural dysfunction, and pain.   ACTIVITY LIMITATIONS:  reading  PARTICIPATION LIMITATIONS: driving, community activity, and recreational activities  PERSONAL FACTORS: Past/current experiences, Time since onset of injury/illness/exacerbation, and 3+ comorbidities: B knee OA, a-fib, depression, insomnia, GERD, multiple breast biopsies in 2022 with R breast lumpectomy and radioactive seed localization 12/2020, L wrist fracture & frozen shoulder in 2023  are also affecting patient's functional outcome.   REHAB POTENTIAL: Excellent  CLINICAL DECISION MAKING: Stable/uncomplicated  EVALUATION COMPLEXITY: Low  GOALS: Goals reviewed with patient? Yes  SHORT TERM GOALS: Target date: 04/28/2023  Patient will be independent with initial HEP to improve outcomes and carryover.   Baseline:  Goal status: MET - 04/19/23   2.  Patient will report 25% improvement in neck pain to improve QOL.  Baseline: 0/10 on eval, 5/10 at worst Goal status: MET - 04/19/23 - 40% improvement  LONG TERM GOALS: Target date: 05/12/2023  Patient will be independent with ongoing/advanced HEP for self-management at home.  Baseline:  Goal status: IN PROGRESS  2.  Patient will demonstrate improved posture to decrease muscle imbalance. Baseline:  Goal status: IN PROGRESS  3.  Patient will report 50-75% improvement in neck pain to improve QOL.  Baseline: 0/10 on eval, 5/10 at worst Goal status: IN PROGRESS  4.  Patient to report 50-75% reduction in frequency and intensity of weekly headaches/migraines.   Baseline: Occasional moderate intensity headaches Goal status: IN PROGRESS   5.  Patient will demonstrate full pain free cervical ROM for safety with driving.  Baseline:  Goal status: IN PROGRESS  6.  Patient will report </= 8% on NDI to demonstrate improved functional ability.  Baseline: 8 / 50 = 16.0 % Goal status: IN PROGRESS  PLAN:  PT FREQUENCY: 2x/week  PT DURATION: 8 weeks  PLANNED INTERVENTIONS: 97164- PT Re-evaluation, 97110-Therapeutic exercises, 97530- Therapeutic activity, 97112- Neuromuscular re-education, 97535- Self Care, 96045- Manual therapy, 97014- Electrical stimulation (unattended), [id number]- Electrical stimulation (manual), [id number]- Ultrasound, [id number]- Traction (mechanical), [id number]- Ionotophoresis 4mg /ml Dexamethasone, Patient/Family education, Taping, Dry Needling, Joint mobilization, Spinal mobilization, Cryotherapy, and Moist heat  PLAN FOR NEXT SESSION: Assess response to TPDN; progress cervical and thoracic mobility/flexibility as well as postural and scapular strengthening - review/update HEP PRN; MT +/- TPDN to address abnormal muscle tension as indicated   Marry Guan, PT 04/19/2023, 9:29 AM

## 2023-04-20 ENCOUNTER — Ambulatory Visit
Admission: RE | Admit: 2023-04-20 | Discharge: 2023-04-20 | Disposition: A | Discharge status: Home / Self Care | Payer: Medicare Other | Source: Ambulatory Visit | Attending: Family Medicine | Admitting: Family Medicine

## 2023-04-20 DIAGNOSIS — Z1231 Encounter for screening mammogram for malignant neoplasm of breast: Secondary | ICD-10-CM | POA: Diagnosis not present

## 2023-04-21 ENCOUNTER — Ambulatory Visit: Payer: Medicare Other

## 2023-04-21 DIAGNOSIS — M542 Cervicalgia: Secondary | ICD-10-CM

## 2023-04-21 DIAGNOSIS — M6281 Muscle weakness (generalized): Secondary | ICD-10-CM | POA: Diagnosis not present

## 2023-04-21 DIAGNOSIS — M62838 Other muscle spasm: Secondary | ICD-10-CM

## 2023-04-21 DIAGNOSIS — R293 Abnormal posture: Secondary | ICD-10-CM

## 2023-04-21 DIAGNOSIS — G4486 Cervicogenic headache: Secondary | ICD-10-CM | POA: Diagnosis not present

## 2023-04-21 NOTE — Therapy (Signed)
 OUTPATIENT PHYSICAL THERAPY TREATMENT   Patient Name: Laura Shields MRN: [id number] DOB:Jun 01, 1955, 68 y.o., female Today's Date: 04/21/2023   END OF SESSION:  PT End of Session - 04/21/23 0809     Visit Number 5    Date for PT Re-Evaluation 05/26/23    Authorization Type [employer] & State [employer]    PT Start Time [phone]    PT Stop Time [phone]    PT Time Calculation (min) 48 min    Activity Tolerance Patient tolerated treatment well    Behavior During Therapy [facility] for tasks assessed/performed                Past Medical History:  Diagnosis Date   Arthritis    bil knees   Atrial fibrillation (HCC)    Depression    GERD (gastroesophageal reflux disease)    Sleep apnea    +Bipap nightly   Past Surgical History:  Procedure Laterality Date   BREAST BIOPSY Right 11/03/2020   BREAST BIOPSY Bilateral 11/2020   BREAST EXCISIONAL BIOPSY Right 12/2020   x2   BREAST LUMPECTOMY WITH RADIOACTIVE SEED LOCALIZATION Right 01/06/2021   Procedure: RIGHT BREAST LUMPECTOMY WITH RADIOACTIVE SEED LOCALIZATION X 2;  Surgeon: Harriette Bouillon, MD;  Location: [facility];  Service: General;  Laterality: Right;   ENDOMETRIAL ABLATION     KNEE ARTHROSCOPY Right    TUBAL LIGATION     Patient Active Problem List   Diagnosis Date Noted   Insomnia 10/22/2022   Depression, recurrent (HCC) 03/03/2022   Mixed hyperlipidemia 03/03/2022   Gastroesophageal reflux disease 03/03/2022    PCP: Sharlene Dory, DO   REFERRING PROVIDER: Sharlene Dory, DO  REFERRING DIAG: M54.2 (ICD-10-CM) - Neck pain   THERAPY DIAG:  Cervicalgia  Abnormal posture  Muscle weakness (generalized)  Cervicogenic headache  Other muscle spasm  RATIONALE FOR EVALUATION AND TREATMENT: Rehabilitation  ONSET DATE: August 2024  NEXT MD VISIT: None scheduled / PRN   SUBJECTIVE:                                                                                                                                                                                                          SUBJECTIVE STATEMENT: Pt reports the DN didn't help as much as it has in the past, still feels tight on the L side.  EVAL:  Pt reports intermittent neck pain L>R which radiates into her head.  She reports occasional headaches.  Occasional L>R radicular pain into shoulder/upper arm but no numbness and tingling.  H/o  L wrist fracture followed by L frozen shoulder in 2023.  She notes worsening posture which she would like to keep from progressing.  No particular limitations with any activities reported.  Hand dominance: Right  PAIN: Are you having pain? No and Yes: NPRS scale: 1-2/10  Pain location: L side of neck into head when present Pain description: more tightness than pain   Aggravating factors: uncertain  Relieving factors: ice, heat, ibuprofen, naproxen   PERTINENT HISTORY:  B knee OA, a-fib, depression, insomnia, GERD, multiple breast biopsies in 2022 with R breast lumpectomy and radioactive seed localization 12/2020, L wrist fracture & frozen shoulder in 2023  PRECAUTIONS: None  RED FLAGS: None  HAND DOMINANCE: Right  WEIGHT BEARING RESTRICTIONS: No  FALLS:  Has patient fallen in last 6 months? No  LIVING ENVIRONMENT: Lives with: lives with their spouse Lives in: House/apartment  OCCUPATION: Retired  PLOF: Independent and Leisure: travel, gym 5x/wk - [employer], rec bike (1 hr) & TM (30 min), shopping, photography   PATIENT GOALS: "Eliminate neck pain and improve posture - be able to do more."   OBJECTIVE: (objective measures completed at initial evaluation unless otherwise dated)  DIAGNOSTIC FINDINGS:  N/A  PATIENT SURVEYS:  NDI 8 / 50 = 16.0 %  COGNITION: Overall cognitive status: Within functional limits for tasks assessed  SENSATION: WFL  POSTURE:  rounded shoulders, forward head, and increased thoracic kyphosis  PALPATION: Increased muscle  tension and mild TTP in L>R UT, LS, rhomboids, teres group, pecs   CERVICAL ROM:   Active ROM Eval  Flexion 40  Extension 45  Right lateral flexion 26 stretching  Left lateral flexion 25  Right rotation 70  Left rotation 71   (Blank rows = not tested)  UPPER EXTREMITY ROM: B UE WNL  UPPER EXTREMITY MMT:  MMT Right eval Left eval  Shoulder flexion 5 4+  Shoulder extension 5 5  Shoulder abduction 5 5  Shoulder adduction    Shoulder internal rotation 5 5  Shoulder external rotation 4+ 4+  Middle trapezius 4 4  Lower trapezius 3- 3-   (Blank rows = not tested)  CERVICAL SPECIAL TESTS:  Spurling's test: Negative and Distraction test: Negative   TODAY'S TREATMENT:  04/21/23 THERAPEUTIC EXERCISE: To improve strength and endurance.   UBE - L1.5 x 6 min (3' each fwd & back) UT and LS stretch x 30" bil  Therapeutic Activity: to improve functional performance. Lat pulls 20lb 2x10  Shld ext 15lb 2x10 Seated rows 20lb 2x10 low grips Wall angels x 12 Manual Therapy: STM to L LS, scalenes Moist Heat: x 10 min to L UT/LS area  04/19/2023  THERAPEUTIC EXERCISE: To improve strength and endurance.  Demonstration, verbal and tactile cues throughout for technique.  UBE - L2.0 x 6 min (3' each fwd & back) YTB resisted cervical retraction 10 x 3" POE cervical retraction x 10  MANUAL THERAPY: To promote normalized muscle tension, improved flexibility, improved joint mobility, increased ROM, and reduced pain. Trigger Point Dry Needling: Treatment instructions/education: Subsequent Treatment: Instructions provided previously at initial dry needling treatment.  Education Handout Provided: Previously Provided Consent: Patient Verbal Consent Given: Yes Treatment: Muscles Treated: L UT and scalenes Skilled palpation and monitoring of soft tissue during DN Electrical Stimulation Performed: No Treatment Response/Outcome: Twitch Response Elicited, Palpable Increase in Muscle Length,  Decreased TTP, and Improved Exercise Tolerance STM/DTM, manual TPR and pin & stretch to muscles addressed with DN  NEUROMUSCULAR RE-EDUCATION: To improve kinesthetic sense and posture.  Standing GTB scap retraction + B shoulder ER 10 x 3", with back along doorframe to provide tactile cues for postural awareness and to facilitate increased scap retraction Standing GTB scap retraction + B shoulder horiz ABD 10 x 3", with back along doorframe to provide tactile cues for postural awareness and to facilitate increased scap retraction Standing GTB scap retraction + B shoulder alt horiz ABD diagonals 10 x 3", with back along doorframe to provide tactile cues for postural awareness and to facilitate increased scap retraction Standing GTB scap retraction + B shoulder row 10 x 3" Standing GTB scap retraction + B shoulder extension 10 x 3" Lower trap setting at wall - Y wall slide with slight lift-off at terminal ROM 2 x 10, 2nd set adding looped YTB at wrists Serratus wall clocks with looped YTB at wrists x 10 Wall pushup plus x 10   04/06/2023 THERAPEUTIC EXERCISE: To improve strength, endurance, and flexibility.  Demonstration, verbal and tactile cues throughout for technique.  UBE - L2.0 x 6 min (3' each fwd & back) Supine cervical PROM and gentle UT, LS and scalene stretches Seated gentle UT stretch 3 x 30" bil Seated gentle LS stretch 3 x 30" bil Seated 3-way scalene stretch with towel over shoulder 3 x 30" bil Cervical extension SNAG with rolled pillowcase 10 x 3" Hooklying pec stretch over pool noodle x 60"  MANUAL THERAPY: To promote normalized muscle tension, improved flexibility, improved joint mobility, increased ROM, and reduced pain. Trigger Point Dry Needling: Treatment instructions/education: Subsequent Treatment: Instructions provided previously at initial dry needling treatment.  Education Handout Provided: Previously Provided Consent: Patient Verbal Consent Given:  Yes Treatment: Muscles Treated: B UT, LS, cervical multifidi & paraspinals, L scalenes Skilled palpation and monitoring of soft tissue during DN Electrical Stimulation Performed: No Treatment Response/Outcome: Twitch Response Elicited, Palpable Increase in Muscle Length, Decreased TTP, and Improved Exercise Tolerance STM/DTM, manual TPR and pin & stretch to muscles addressed with DN   04/04/2023 THERAPEUTIC EXERCISE: To improve strength, endurance, and flexibility.  Demonstration, verbal and tactile cues throughout for technique.  UBE - L1.0 x 6 min (3' each fwd & back) Seated cervical retraction 10 x 5" Seated thoracic extension + pec stretch with hands behind head leaning over back of chair 10 x 5" Seated gentle UT stretch 3 x 30" bil Seated gentle LS stretch 3 x 30" bil Seated scap retraction + B shoulder ER 10 x 3", 2nd set with RTB  MANUAL THERAPY: To promote normalized muscle tension, improved flexibility, and reduced pain. Trigger Point Dry Needling: Treatment instructions/education: Initial Treatment: Pt instructed on Dry Needling rational, procedures, and possible side effects. Pt instructed to expect mild to moderate muscle soreness later in the day and/or into the next day.  Pt instructed in methods to reduce muscle soreness. Pt instructed to continue prescribed HEP. Because Dry Needling was performed over or adjacent to a lung field, pt was educated on S/S of pneumothorax and to seek immediate medical attention should they occur.  Patient was educated on signs and symptoms of infection and other risk factors and advised to seek medical attention should they occur.  Patient verbalized understanding of these instructions and education.  Education Handout Provided: Yes Consent: Patient Verbal Consent Given: Yes Treatment: Muscles Treated: L UT and LS Skilled palpation and monitoring of soft tissue during DN Electrical Stimulation Performed: No Treatment Response/Outcome:  Twitch Response Elicited, Palpable Increase in Muscle Length, Decreased TTP, and Improved Exercise Tolerance (less tightness with UT &  LS stretches) STM/DTM, manual TPR and pin & stretch to muscles addressed with DN  NEUROMUSCULAR RE-EDUCATION: To improve kinesthetic sense and posture.  Standing RTB scap retraction + B shoulder horiz ABD 10 x 3", with back along doorframe to provide tactile cues for postural awareness and to facilitate increased scap retraction Standing RTB scap retraction + B shoulder alt horiz ABD diagonals 10 x 3", with back along doorframe to provide tactile cues for postural awareness and to facilitate increased scap retraction Standing RTB scap retraction + B shoulder row 10 x 3" Standing RTB scap retraction + B shoulder extension 10 x 3"   PATIENT EDUCATION:  Education details: HEP progression, postural awareness, role of DN, and DN rational, procedure, outcomes, potential side effects, and recommended post-treatment exercises/activity   Person educated: Patient Education method: Explanation, Demonstration, Verbal cues, and Handouts Education comprehension: verbalized understanding, returned demonstration, verbal cues required, and needs further education  HOME EXERCISE PROGRAM: Access Code: [email or web address] URL: [email or web address] Date: 04/19/2023 Prepared by: Glenetta Hew  Exercises - Supine Cervical Retraction with Towel  - 2 x daily - 7 x weekly - 2 sets - 10 reps - 5 sec hold - Seated Cervical Retraction  - 2 x daily - 7 x weekly - 2 sets - 10 reps - 5 sec hold - Seated Thoracic Lumbar Extension with Pectoralis Stretch  - 2 x daily - 7 x weekly - 2 sets - 10 reps - 5 sec hold - Seated Gentle Upper Trapezius Stretch  - 2 x daily - 7 x weekly - 3 reps - 30 sec hold - Gentle Levator Scapulae Stretch  - 2 x daily - 7 x weekly - 3 reps - 30 sec hold - Shoulder External Rotation and Scapular Retraction with Resistance  - 1 x daily - 3 x weekly - 2 sets - 10  reps - 3-5 sec hold - Standing Shoulder Horizontal Abduction with Resistance  - 1 x daily - 3 x weekly - 2 sets - 10 reps - 3 sec hold - Standing Shoulder Diagonal Horizontal Abduction 60/120 Degrees with Resistance  - 1 x daily - 3 x weekly - 2 sets - 10 reps - 3 sec hold - Seated Scalene Stretch with Towel  - 2 x daily - 7 x weekly - 3 reps - 30 sec hold - Mid-Lower Cervical Extension SNAG with Strap  - 1 x daily - 7 x weekly - 2 sets - 10 reps - 3 sec hold - Cervical Retraction Prone on Elbows  - 1 x daily - 7 x weekly - 2 sets - 10 reps - 3 sec hold - Wall Push Up with Plus  - 1 x daily - 7 x weekly - 2 sets - 10 reps - 3 sec hold - Wall Clock with Theraband  - 1 x daily - 3 x weekly - 2 sets - 10 reps - 3 sec hold  Patient Education - Trigger Point Dry Needling   ASSESSMENT:  CLINICAL IMPRESSION:  Pt responded well to the progression of exercises. Cues provided to correct form and prevent compensatory movements. Continued tightness on L side of her neck, this loosened up a bit with MT therefore did moist heat post session to further increase tissue extensibility. She will benefit from continued skilled PT to address ongoing deficits to improve posture, mobility and activity tolerance with decreased pain interference.  OBJECTIVE IMPAIRMENTS: decreased activity tolerance, decreased knowledge of condition, decreased ROM, decreased strength, hypomobility, increased fascial restrictions, impaired perceived functional  ability, increased muscle spasms, impaired flexibility, impaired UE functional use, improper body mechanics, postural dysfunction, and pain.   ACTIVITY LIMITATIONS:  reading  PARTICIPATION LIMITATIONS: driving, community activity, and recreational activities  PERSONAL FACTORS: Past/current experiences, Time since onset of injury/illness/exacerbation, and 3+ comorbidities: B knee OA, a-fib, depression, insomnia, GERD, multiple breast biopsies in 2022 with R breast lumpectomy and  radioactive seed localization 12/2020, L wrist fracture & frozen shoulder in 2023  are also affecting patient's functional outcome.   REHAB POTENTIAL: Excellent  CLINICAL DECISION MAKING: Stable/uncomplicated  EVALUATION COMPLEXITY: Low   GOALS: Goals reviewed with patient? Yes  SHORT TERM GOALS: Target date: 04/28/2023  Patient will be independent with initial HEP to improve outcomes and carryover.  Baseline:  Goal status: MET - 04/19/23   2.  Patient will report 25% improvement in neck pain to improve QOL.  Baseline: 0/10 on eval, 5/10 at worst Goal status: MET - 04/19/23 - 40% improvement  LONG TERM GOALS: Target date: 05/12/2023  Patient will be independent with ongoing/advanced HEP for self-management at home.  Baseline:  Goal status: IN PROGRESS  2.  Patient will demonstrate improved posture to decrease muscle imbalance. Baseline:  Goal status: IN PROGRESS  3.  Patient will report 50-75% improvement in neck pain to improve QOL.  Baseline: 0/10 on eval, 5/10 at worst Goal status: IN PROGRESS  4.  Patient to report 50-75% reduction in frequency and intensity of weekly headaches/migraines.   Baseline: Occasional moderate intensity headaches Goal status: IN PROGRESS   5.  Patient will demonstrate full pain free cervical ROM for safety with driving.  Baseline:  Goal status: IN PROGRESS  6.  Patient will report </= 8% on NDI to demonstrate improved functional ability.  Baseline: 8 / 50 = 16.0 % Goal status: IN PROGRESS  PLAN:  PT FREQUENCY: 2x/week  PT DURATION: 8 weeks  PLANNED INTERVENTIONS: 97164- PT Re-evaluation, 97110-Therapeutic exercises, 97530- Therapeutic activity, 97112- Neuromuscular re-education, 97535- Self Care, [id number]- Manual therapy, 97014- Electrical stimulation (unattended), [id number]- Electrical stimulation (manual), [id number]- Ultrasound, 28413- Traction (mechanical), [id number]- Ionotophoresis 4mg /ml Dexamethasone, Patient/Family education, Taping, Dry  Needling, Joint mobilization, Spinal mobilization, Cryotherapy, and Moist heat  PLAN FOR NEXT SESSION: progress cervical and thoracic mobility/flexibility as well as postural and scapular strengthening - review/update HEP PRN; MT +/- TPDN to address abnormal muscle tension as indicated   Darleene Cleaver, PTA 04/21/2023, 8:44 AM

## 2023-04-25 ENCOUNTER — Ambulatory Visit: Payer: Medicare Other

## 2023-04-25 DIAGNOSIS — M6281 Muscle weakness (generalized): Secondary | ICD-10-CM | POA: Diagnosis not present

## 2023-04-25 DIAGNOSIS — G4486 Cervicogenic headache: Secondary | ICD-10-CM | POA: Diagnosis not present

## 2023-04-25 DIAGNOSIS — M62838 Other muscle spasm: Secondary | ICD-10-CM | POA: Diagnosis not present

## 2023-04-25 DIAGNOSIS — R293 Abnormal posture: Secondary | ICD-10-CM | POA: Diagnosis not present

## 2023-04-25 DIAGNOSIS — M542 Cervicalgia: Secondary | ICD-10-CM | POA: Diagnosis not present

## 2023-04-25 NOTE — Therapy (Signed)
 OUTPATIENT PHYSICAL THERAPY TREATMENT   Patient Name: Laura Shields MRN: [id number] DOB:May 29, 1955, 68 y.o., female Today's Date: 04/25/2023   END OF SESSION:  PT End of Session - 04/25/23 0806     Visit Number 6    Date for PT Re-Evaluation 05/26/23    Authorization Type [employer] & State [employer]    PT Start Time 0802    PT Stop Time [phone]    PT Time Calculation (min) 41 min    Activity Tolerance Patient tolerated treatment well    Behavior During Therapy [facility] for tasks assessed/performed                 Past Medical History:  Diagnosis Date   Arthritis    bil knees   Atrial fibrillation (HCC)    Depression    GERD (gastroesophageal reflux disease)    Sleep apnea    +Bipap nightly   Past Surgical History:  Procedure Laterality Date   BREAST BIOPSY Right 11/03/2020   BREAST BIOPSY Bilateral 11/2020   BREAST EXCISIONAL BIOPSY Right 12/2020   x2   BREAST LUMPECTOMY WITH RADIOACTIVE SEED LOCALIZATION Right 01/06/2021   Procedure: RIGHT BREAST LUMPECTOMY WITH RADIOACTIVE SEED LOCALIZATION X 2;  Surgeon: Harriette Bouillon, MD;  Location: [facility];  Service: General;  Laterality: Right;   ENDOMETRIAL ABLATION     KNEE ARTHROSCOPY Right    TUBAL LIGATION     Patient Active Problem List   Diagnosis Date Noted   Insomnia 10/22/2022   Depression, recurrent (HCC) 03/03/2022   Mixed hyperlipidemia 03/03/2022   Gastroesophageal reflux disease 03/03/2022    PCP: Sharlene Dory, DO   REFERRING PROVIDER: Sharlene Dory, DO  REFERRING DIAG: M54.2 (ICD-10-CM) - Neck pain   THERAPY DIAG:  Cervicalgia  Abnormal posture  Muscle weakness (generalized)  Cervicogenic headache  Other muscle spasm  RATIONALE FOR EVALUATION AND TREATMENT: Rehabilitation  ONSET DATE: August 2024  NEXT MD VISIT: None scheduled / PRN   SUBJECTIVE:                                                                                                                                                                                                          SUBJECTIVE STATEMENT: Pt reports slight tightness on L side but not as bad as the other day.  EVAL:  Pt reports intermittent neck pain L>R which radiates into her head.  She reports occasional headaches.  Occasional L>R radicular pain into shoulder/upper arm but no numbness and tingling.  H/o L wrist fracture followed by  L frozen shoulder in 2023.  She notes worsening posture which she would like to keep from progressing.  No particular limitations with any activities reported.  Hand dominance: Right  PAIN: Are you having pain? No and Yes: NPRS scale: 0/10  Pain location: L side of neck into head when present Pain description: more tightness than pain   Aggravating factors: uncertain  Relieving factors: ice, heat, ibuprofen, naproxen   PERTINENT HISTORY:  B knee OA, a-fib, depression, insomnia, GERD, multiple breast biopsies in 2022 with R breast lumpectomy and radioactive seed localization 12/2020, L wrist fracture & frozen shoulder in 2023  PRECAUTIONS: None  RED FLAGS: None  HAND DOMINANCE: Right  WEIGHT BEARING RESTRICTIONS: No  FALLS:  Has patient fallen in last 6 months? No  LIVING ENVIRONMENT: Lives with: lives with their spouse Lives in: House/apartment  OCCUPATION: Retired  PLOF: Independent and Leisure: travel, gym 5x/wk - [employer], rec bike (1 hr) & TM (30 min), shopping, photography   PATIENT GOALS: "Eliminate neck pain and improve posture - be able to do more."   OBJECTIVE: (objective measures completed at initial evaluation unless otherwise dated)  DIAGNOSTIC FINDINGS:  N/A  PATIENT SURVEYS:  NDI 8 / 50 = 16.0 %  COGNITION: Overall cognitive status: Within functional limits for tasks assessed  SENSATION: WFL  POSTURE:  rounded shoulders, forward head, and increased thoracic kyphosis  PALPATION: Increased muscle tension and mild TTP in L>R  UT, LS, rhomboids, teres group, pecs   CERVICAL ROM:   Active ROM Eval  Flexion 40  Extension 45  Right lateral flexion 26 stretching  Left lateral flexion 25  Right rotation 70  Left rotation 71   (Blank rows = not tested)  UPPER EXTREMITY ROM: B UE WNL  UPPER EXTREMITY MMT:  MMT Right eval Left eval  Shoulder flexion 5 4+  Shoulder extension 5 5  Shoulder abduction 5 5  Shoulder adduction    Shoulder internal rotation 5 5  Shoulder external rotation 4+ 4+  Middle trapezius 4 4  Lower trapezius 3- 3-   (Blank rows = not tested)  CERVICAL SPECIAL TESTS:  Spurling's test: Negative and Distraction test: Negative   TODAY'S TREATMENT:  04/25/23 THERAPEUTIC EXERCISE: To improve strength and endurance.   UBE - L1.5 x 6 min (3' each fwd & back) Standing ER YTB 10x3"; 2 sets R/L Supine on foam roll: - bil shld flexion 10x5" -thoracic hugs 10x5" - chest stretch 2x30" -horizontal ABD x 10 Seated flexion stretch with green pball 2x30" Therapeutic Activity: to improve functional performance. Lat pulls 25lb 2x10 Standing mid rows 5lb 2x10 Standing horizontal ABD YTB 2 x 10  04/21/23 THERAPEUTIC EXERCISE: To improve strength and endurance.   UBE - L1.5 x 6 min (3' each fwd & back) UT and LS stretch x 30" bil  Therapeutic Activity: to improve functional performance. Lat pulls 20lb 2x10  Shld ext 15lb 2x10 Seated rows 20lb 2x10 low grips Wall angels x 12 Manual Therapy: STM to L LS, scalenes Moist Heat: x 10 min to L UT/LS area  04/19/2023  THERAPEUTIC EXERCISE: To improve strength and endurance.  Demonstration, verbal and tactile cues throughout for technique.  UBE - L2.0 x 6 min (3' each fwd & back) YTB resisted cervical retraction 10 x 3" POE cervical retraction x 10  MANUAL THERAPY: To promote normalized muscle tension, improved flexibility, improved joint mobility, increased ROM, and reduced pain. Trigger Point Dry Needling: Treatment  instructions/education: Subsequent Treatment: Instructions provided previously at  initial dry needling treatment.  Education Handout Provided: Previously Provided Consent: Patient Verbal Consent Given: Yes Treatment: Muscles Treated: L UT and scalenes Skilled palpation and monitoring of soft tissue during DN Electrical Stimulation Performed: No Treatment Response/Outcome: Twitch Response Elicited, Palpable Increase in Muscle Length, Decreased TTP, and Improved Exercise Tolerance STM/DTM, manual TPR and pin & stretch to muscles addressed with DN  NEUROMUSCULAR RE-EDUCATION: To improve kinesthetic sense and posture.  Standing GTB scap retraction + B shoulder ER 10 x 3", with back along doorframe to provide tactile cues for postural awareness and to facilitate increased scap retraction Standing GTB scap retraction + B shoulder horiz ABD 10 x 3", with back along doorframe to provide tactile cues for postural awareness and to facilitate increased scap retraction Standing GTB scap retraction + B shoulder alt horiz ABD diagonals 10 x 3", with back along doorframe to provide tactile cues for postural awareness and to facilitate increased scap retraction Standing GTB scap retraction + B shoulder row 10 x 3" Standing GTB scap retraction + B shoulder extension 10 x 3" Lower trap setting at wall - Y wall slide with slight lift-off at terminal ROM 2 x 10, 2nd set adding looped YTB at wrists Serratus wall clocks with looped YTB at wrists x 10 Wall pushup plus x 10   04/06/2023 THERAPEUTIC EXERCISE: To improve strength, endurance, and flexibility.  Demonstration, verbal and tactile cues throughout for technique.  UBE - L2.0 x 6 min (3' each fwd & back) Supine cervical PROM and gentle UT, LS and scalene stretches Seated gentle UT stretch 3 x 30" bil Seated gentle LS stretch 3 x 30" bil Seated 3-way scalene stretch with towel over shoulder 3 x 30" bil Cervical extension SNAG with rolled pillowcase 10 x  3" Hooklying pec stretch over pool noodle x 60"  MANUAL THERAPY: To promote normalized muscle tension, improved flexibility, improved joint mobility, increased ROM, and reduced pain. Trigger Point Dry Needling: Treatment instructions/education: Subsequent Treatment: Instructions provided previously at initial dry needling treatment.  Education Handout Provided: Previously Provided Consent: Patient Verbal Consent Given: Yes Treatment: Muscles Treated: B UT, LS, cervical multifidi & paraspinals, L scalenes Skilled palpation and monitoring of soft tissue during DN Electrical Stimulation Performed: No Treatment Response/Outcome: Twitch Response Elicited, Palpable Increase in Muscle Length, Decreased TTP, and Improved Exercise Tolerance STM/DTM, manual TPR and pin & stretch to muscles addressed with DN   04/04/2023 THERAPEUTIC EXERCISE: To improve strength, endurance, and flexibility.  Demonstration, verbal and tactile cues throughout for technique.  UBE - L1.0 x 6 min (3' each fwd & back) Seated cervical retraction 10 x 5" Seated thoracic extension + pec stretch with hands behind head leaning over back of chair 10 x 5" Seated gentle UT stretch 3 x 30" bil Seated gentle LS stretch 3 x 30" bil Seated scap retraction + B shoulder ER 10 x 3", 2nd set with RTB  MANUAL THERAPY: To promote normalized muscle tension, improved flexibility, and reduced pain. Trigger Point Dry Needling: Treatment instructions/education: Initial Treatment: Pt instructed on Dry Needling rational, procedures, and possible side effects. Pt instructed to expect mild to moderate muscle soreness later in the day and/or into the next day.  Pt instructed in methods to reduce muscle soreness. Pt instructed to continue prescribed HEP. Because Dry Needling was performed over or adjacent to a lung field, pt was educated on S/S of pneumothorax and to seek immediate medical attention should they occur.  Patient was educated on  signs and symptoms of  infection and other risk factors and advised to seek medical attention should they occur.  Patient verbalized understanding of these instructions and education.  Education Handout Provided: Yes Consent: Patient Verbal Consent Given: Yes Treatment: Muscles Treated: L UT and LS Skilled palpation and monitoring of soft tissue during DN Electrical Stimulation Performed: No Treatment Response/Outcome: Twitch Response Elicited, Palpable Increase in Muscle Length, Decreased TTP, and Improved Exercise Tolerance (less tightness with UT & LS stretches) STM/DTM, manual TPR and pin & stretch to muscles addressed with DN  NEUROMUSCULAR RE-EDUCATION: To improve kinesthetic sense and posture.  Standing RTB scap retraction + B shoulder horiz ABD 10 x 3", with back along doorframe to provide tactile cues for postural awareness and to facilitate increased scap retraction Standing RTB scap retraction + B shoulder alt horiz ABD diagonals 10 x 3", with back along doorframe to provide tactile cues for postural awareness and to facilitate increased scap retraction Standing RTB scap retraction + B shoulder row 10 x 3" Standing RTB scap retraction + B shoulder extension 10 x 3"   PATIENT EDUCATION:  Education details: HEP progression, postural awareness, role of DN, and DN rational, procedure, outcomes, potential side effects, and recommended post-treatment exercises/activity   Person educated: Patient Education method: Explanation, Demonstration, Verbal cues, and Handouts Education comprehension: verbalized understanding, returned demonstration, verbal cues required, and needs further education  HOME EXERCISE PROGRAM: Access Code: [email or web address] URL: [email or web address] Date: 04/19/2023 Prepared by: Glenetta Hew  Exercises - Supine Cervical Retraction with Towel  - 2 x daily - 7 x weekly - 2 sets - 10 reps - 5 sec hold - Seated Cervical Retraction  - 2 x daily - 7 x weekly -  2 sets - 10 reps - 5 sec hold - Seated Thoracic Lumbar Extension with Pectoralis Stretch  - 2 x daily - 7 x weekly - 2 sets - 10 reps - 5 sec hold - Seated Gentle Upper Trapezius Stretch  - 2 x daily - 7 x weekly - 3 reps - 30 sec hold - Gentle Levator Scapulae Stretch  - 2 x daily - 7 x weekly - 3 reps - 30 sec hold - Shoulder External Rotation and Scapular Retraction with Resistance  - 1 x daily - 3 x weekly - 2 sets - 10 reps - 3-5 sec hold - Standing Shoulder Horizontal Abduction with Resistance  - 1 x daily - 3 x weekly - 2 sets - 10 reps - 3 sec hold - Standing Shoulder Diagonal Horizontal Abduction 60/120 Degrees with Resistance  - 1 x daily - 3 x weekly - 2 sets - 10 reps - 3 sec hold - Seated Scalene Stretch with Towel  - 2 x daily - 7 x weekly - 3 reps - 30 sec hold - Mid-Lower Cervical Extension SNAG with Strap  - 1 x daily - 7 x weekly - 2 sets - 10 reps - 3 sec hold - Cervical Retraction Prone on Elbows  - 1 x daily - 7 x weekly - 2 sets - 10 reps - 3 sec hold - Wall Push Up with Plus  - 1 x daily - 7 x weekly - 2 sets - 10 reps - 3 sec hold - Wall Clock with Theraband  - 1 x daily - 3 x weekly - 2 sets - 10 reps - 3 sec hold  Patient Education - Trigger Point Dry Needling   ASSESSMENT:  CLINICAL IMPRESSION:  Continued work on postural strength and flexibility. Pt now reports 50%  overall improvement in pain and headaches, progressing with goals. Minor cues required throughout session to correct form. She will benefit from continued skilled PT to address ongoing deficits to improve posture, mobility and activity tolerance with decreased pain interference.  OBJECTIVE IMPAIRMENTS: decreased activity tolerance, decreased knowledge of condition, decreased ROM, decreased strength, hypomobility, increased fascial restrictions, impaired perceived functional ability, increased muscle spasms, impaired flexibility, impaired UE functional use, improper body mechanics, postural dysfunction, and  pain.   ACTIVITY LIMITATIONS:  reading  PARTICIPATION LIMITATIONS: driving, community activity, and recreational activities  PERSONAL FACTORS: Past/current experiences, Time since onset of injury/illness/exacerbation, and 3+ comorbidities: B knee OA, a-fib, depression, insomnia, GERD, multiple breast biopsies in 2022 with R breast lumpectomy and radioactive seed localization 12/2020, L wrist fracture & frozen shoulder in 2023  are also affecting patient's functional outcome.   REHAB POTENTIAL: Excellent  CLINICAL DECISION MAKING: Stable/uncomplicated  EVALUATION COMPLEXITY: Low   GOALS: Goals reviewed with patient? Yes  SHORT TERM GOALS: Target date: 04/28/2023  Patient will be independent with initial HEP to improve outcomes and carryover.  Baseline:  Goal status: MET - 04/19/23   2.  Patient will report 25% improvement in neck pain to improve QOL.  Baseline: 0/10 on eval, 5/10 at worst Goal status: MET - 04/19/23 - 40% improvement  LONG TERM GOALS: Target date: 05/12/2023  Patient will be independent with ongoing/advanced HEP for self-management at home.  Baseline:  Goal status: IN PROGRESS  2.  Patient will demonstrate improved posture to decrease muscle imbalance. Baseline:  Goal status: IN PROGRESS  3.  Patient will report 50-75% improvement in neck pain to improve QOL.  Baseline: 0/10 on eval, 5/10 at worst Goal status: IN PROGRESS- 04/25/23 50% improvement  4.  Patient to report 50-75% reduction in frequency and intensity of weekly headaches/migraines.   Baseline: Occasional moderate intensity headaches Goal status: IN PROGRESS -04/25/23 pt reports 50% better  5.  Patient will demonstrate full pain free cervical ROM for safety with driving.  Baseline:  Goal status: IN PROGRESS  6.  Patient will report </= 8% on NDI to demonstrate improved functional ability.  Baseline: 8 / 50 = 16.0 % Goal status: IN PROGRESS  PLAN:  PT FREQUENCY: 2x/week  PT DURATION: 8  weeks  PLANNED INTERVENTIONS: 97164- PT Re-evaluation, 97110-Therapeutic exercises, 97530- Therapeutic activity, 97112- Neuromuscular re-education, 97535- Self Care, [id number]- Manual therapy, 97014- Electrical stimulation (unattended), [id number]- Electrical stimulation (manual), [id number]- Ultrasound, [id number]- Traction (mechanical), [id number]- Ionotophoresis 4mg /ml Dexamethasone, Patient/Family education, Taping, Dry Needling, Joint mobilization, Spinal mobilization, Cryotherapy, and Moist heat  PLAN FOR NEXT SESSION: progress cervical and thoracic mobility/flexibility as well as postural and scapular strengthening - review/update HEP PRN; MT +/- TPDN to address abnormal muscle tension as indicated   Darleene Cleaver, PTA 04/25/2023, 8:43 AM

## 2023-04-28 ENCOUNTER — Encounter: Payer: Self-pay | Admitting: Physical Therapy

## 2023-04-28 ENCOUNTER — Ambulatory Visit: Payer: Medicare Other | Admitting: Physical Therapy

## 2023-04-28 DIAGNOSIS — M6281 Muscle weakness (generalized): Secondary | ICD-10-CM

## 2023-04-28 DIAGNOSIS — G4486 Cervicogenic headache: Secondary | ICD-10-CM

## 2023-04-28 DIAGNOSIS — R293 Abnormal posture: Secondary | ICD-10-CM | POA: Diagnosis not present

## 2023-04-28 DIAGNOSIS — M62838 Other muscle spasm: Secondary | ICD-10-CM

## 2023-04-28 DIAGNOSIS — M542 Cervicalgia: Secondary | ICD-10-CM | POA: Diagnosis not present

## 2023-04-28 NOTE — Therapy (Signed)
 OUTPATIENT PHYSICAL THERAPY TREATMENT   Patient Name: Laura Shields MRN: [id number] DOB:22-May-1955, 68 y.o., female Today's Date: 04/28/2023   END OF SESSION:  PT End of Session - 04/28/23 0804     Visit Number 7    Date for PT Re-Evaluation 05/26/23    Authorization Type [employer] & State [employer]    PT Start Time [phone]    PT Stop Time 0845    PT Time Calculation (min) 41 min    Activity Tolerance Patient tolerated treatment well    Behavior During Therapy [facility] for tasks assessed/performed                  Past Medical History:  Diagnosis Date   Arthritis    bil knees   Atrial fibrillation (HCC)    Depression    GERD (gastroesophageal reflux disease)    Sleep apnea    +Bipap nightly   Past Surgical History:  Procedure Laterality Date   BREAST BIOPSY Right 11/03/2020   BREAST BIOPSY Bilateral 11/2020   BREAST EXCISIONAL BIOPSY Right 12/2020   x2   BREAST LUMPECTOMY WITH RADIOACTIVE SEED LOCALIZATION Right 01/06/2021   Procedure: RIGHT BREAST LUMPECTOMY WITH RADIOACTIVE SEED LOCALIZATION X 2;  Surgeon: Harriette Bouillon, MD;  Location: [facility];  Service: General;  Laterality: Right;   ENDOMETRIAL ABLATION     KNEE ARTHROSCOPY Right    TUBAL LIGATION     Patient Active Problem List   Diagnosis Date Noted   Insomnia 10/22/2022   Depression, recurrent (HCC) 03/03/2022   Mixed hyperlipidemia 03/03/2022   Gastroesophageal reflux disease 03/03/2022    PCP: Sharlene Dory, DO   REFERRING PROVIDER: Sharlene Dory, DO  REFERRING DIAG: M54.2 (ICD-10-CM) - Neck pain   THERAPY DIAG:  Cervicalgia  Abnormal posture  Muscle weakness (generalized)  Cervicogenic headache  Other muscle spasm  RATIONALE FOR EVALUATION AND TREATMENT: Rehabilitation  ONSET DATE: August 2024  NEXT MD VISIT: None scheduled / PRN   SUBJECTIVE:                                                                                                                                                                                                          SUBJECTIVE STATEMENT: Pt reports "I'm doing pretty good this morning".  EVAL:  Pt reports intermittent neck pain L>R which radiates into her head.  She reports occasional headaches.  Occasional L>R radicular pain into shoulder/upper arm but no numbness and tingling.  H/o L wrist fracture followed by L frozen shoulder in 2023.  She notes worsening posture which she would like to keep from progressing.  No particular limitations with any activities reported.  Hand dominance: Right  PAIN: Are you having pain? No and Yes: NPRS scale: 0/10  Pain location: L side of neck into head when present Pain description: more tightness than pain   Aggravating factors: uncertain  Relieving factors: ice, heat, ibuprofen, naproxen   PERTINENT HISTORY:  B knee OA, a-fib, depression, insomnia, GERD, multiple breast biopsies in 2022 with R breast lumpectomy and radioactive seed localization 12/2020, L wrist fracture & frozen shoulder in 2023  PRECAUTIONS: None  RED FLAGS: None  HAND DOMINANCE: Right  WEIGHT BEARING RESTRICTIONS: No  FALLS:  Has patient fallen in last 6 months? No  LIVING ENVIRONMENT: Lives with: lives with their spouse Lives in: House/apartment  OCCUPATION: Retired  PLOF: Independent and Leisure: travel, gym 5x/wk - [employer], rec bike (1 hr) & TM (30 min), shopping, photography   PATIENT GOALS: "Eliminate neck pain and improve posture - be able to do more."   OBJECTIVE: (objective measures completed at initial evaluation unless otherwise dated)  DIAGNOSTIC FINDINGS:  N/A  PATIENT SURVEYS:  NDI 8 / 50 = 16.0 %  COGNITION: Overall cognitive status: Within functional limits for tasks assessed  SENSATION: WFL  POSTURE:  rounded shoulders, forward head, and increased thoracic kyphosis  PALPATION: Increased muscle tension and mild TTP in L>R UT, LS, rhomboids,  teres group, pecs   CERVICAL ROM:   Active ROM Eval 04/28/23  Flexion 40 48  Extension 45 54  Right lateral flexion 26 stretching 35  Left lateral flexion 25 30  Right rotation 70 78  Left rotation 71 77   (Blank rows = not tested)  UPPER EXTREMITY ROM: B UE WNL  UPPER EXTREMITY MMT:  MMT Right eval Left eval R 04/28/23 L 04/28/23  Shoulder flexion 5 4+ 5 5  Shoulder extension 5 5    Shoulder abduction 5 5    Shoulder adduction      Shoulder internal rotation 5 5    Shoulder external rotation 4+ 4+ 5 5  Middle trapezius 4 4 4+ 4+  Lower trapezius 3- 3- 3+ 3+   (Blank rows = not tested)  CERVICAL SPECIAL TESTS:  Spurling's test: Negative and Distraction test: Negative   TODAY'S TREATMENT:   04/28/2023  THERAPEUTIC EXERCISE: To improve strength and endurance.  Demonstration, verbal and tactile cues throughout for technique.  UBE - L2.0 x 6 min (3' each fwd & back) Standing YTB B scaption 2 x 10 Standing YTB B shoulder flexion 2 x 10  Cervical ROM UE MMT  NEUROMUSCULAR RE-EDUCATION: To improve coordination, kinesthesia, and posture.  Lower trap setting - V wall slide with looped YTB at wrists + slight lift-off wall at terminal ROM 2 x 10 Standing GTB scap retraction + B shoulder W row 10 x 3" Standing GTB scap retraction + B lat pull down 10 x 3" Prone over green Pball - thoracic extension + scap retraction + I's, T's, Y's and W's x 10 each   04/25/23 THERAPEUTIC EXERCISE: To improve strength and endurance.   UBE - L1.5 x 6 min (3' each fwd & back) Standing ER YTB 10x3"; 2 sets R/L Supine on foam roll: - bil shld flexion 10x5" -thoracic hugs 10x5" - chest stretch 2x30" -horizontal ABD x 10 Seated flexion stretch with green pball 2x30" Therapeutic Activity: to improve functional performance. Lat pulls 25lb 2x10 Standing mid rows 5lb 2x10 Standing horizontal ABD  YTB 2 x 10   04/21/23 THERAPEUTIC EXERCISE: To improve strength and endurance.   UBE - L1.5 x 6 min  (3' each fwd & back) UT and LS stretch x 30" bil  Therapeutic Activity: to improve functional performance. Lat pulls 20lb 2x10  Shld ext 15lb 2x10 Seated rows 20lb 2x10 low grips Wall angels x 12 Manual Therapy: STM to L LS, scalenes Moist Heat: x 10 min to L UT/LS area   PATIENT EDUCATION:  Education details: HEP update - lower trap setting and postural awareness   Person educated: Patient Education method: Explanation, Demonstration, Verbal cues, and Handouts Education comprehension: verbalized understanding, returned demonstration, verbal cues required, and needs further education  HOME EXERCISE PROGRAM: Access Code: [email or web address] URL: [email or web address] Date: 04/28/2023 Prepared by: Glenetta Hew  Exercises - Supine Cervical Retraction with Towel  - 2 x daily - 7 x weekly - 2 sets - 10 reps - 5 sec hold - Seated Cervical Retraction  - 2 x daily - 7 x weekly - 2 sets - 10 reps - 5 sec hold - Seated Thoracic Lumbar Extension with Pectoralis Stretch  - 2 x daily - 7 x weekly - 2 sets - 10 reps - 5 sec hold - Seated Gentle Upper Trapezius Stretch  - 2 x daily - 7 x weekly - 3 reps - 30 sec hold - Gentle Levator Scapulae Stretch  - 2 x daily - 7 x weekly - 3 reps - 30 sec hold - Shoulder External Rotation and Scapular Retraction with Resistance  - 1 x daily - 3 x weekly - 2 sets - 10 reps - 3-5 sec hold - Standing Shoulder Horizontal Abduction with Resistance  - 1 x daily - 3 x weekly - 2 sets - 10 reps - 3 sec hold - Standing Shoulder Diagonal Horizontal Abduction 60/120 Degrees with Resistance  - 1 x daily - 3 x weekly - 2 sets - 10 reps - 3 sec hold - Seated Scalene Stretch with Towel  - 2 x daily - 7 x weekly - 3 reps - 30 sec hold - Mid-Lower Cervical Extension SNAG with Strap  - 1 x daily - 7 x weekly - 2 sets - 10 reps - 3 sec hold - Cervical Retraction Prone on Elbows  - 1 x daily - 7 x weekly - 2 sets - 10 reps - 3 sec hold - Wall Push Up with Plus  - 1 x daily  - 7 x weekly - 2 sets - 10 reps - 3 sec hold - Wall Clock with Theraband  - 1 x daily - 3 x weekly - 2 sets - 10 reps - 3 sec hold - Standing Low Trap Setting with Resistance at Wall  - 1 x daily - 3 x weekly - 2 sets - 10 reps - 3 sec hold  Patient Education - Trigger Point Dry Needling   ASSESSMENT:  CLINICAL IMPRESSION: Laura Shields denies any pain today and notes awareness of improving posture.  Her cervical ROM is now full w/o pain - LTG #5 met.  Overall strength improving but still with greatest weakness in lower traps, therefore continued strengthening emphasis targeting lower scapular muscles as well as thoracic extension.  Minor cues necessary for proper technique/movement patterns but all exercises well tolerated.  Khalessi will benefit from continued skilled PT to address ongoing deficits to improve posture, mobility and activity tolerance with decreased pain interference.  OBJECTIVE IMPAIRMENTS: decreased activity tolerance, decreased knowledge of condition, decreased ROM, decreased  strength, hypomobility, increased fascial restrictions, impaired perceived functional ability, increased muscle spasms, impaired flexibility, impaired UE functional use, improper body mechanics, postural dysfunction, and pain.   ACTIVITY LIMITATIONS:  reading  PARTICIPATION LIMITATIONS: driving, community activity, and recreational activities  PERSONAL FACTORS: Past/current experiences, Time since onset of injury/illness/exacerbation, and 3+ comorbidities: B knee OA, a-fib, depression, insomnia, GERD, multiple breast biopsies in 2022 with R breast lumpectomy and radioactive seed localization 12/2020, L wrist fracture & frozen shoulder in 2023  are also affecting patient's functional outcome.   REHAB POTENTIAL: Excellent  CLINICAL DECISION MAKING: Stable/uncomplicated  EVALUATION COMPLEXITY: Low   GOALS: Goals reviewed with patient? Yes  SHORT TERM GOALS: Target date: 04/28/2023  Patient will be  independent with initial HEP to improve outcomes and carryover.  Baseline:  Goal status: MET - 04/19/23   2.  Patient will report 25% improvement in neck pain to improve QOL.  Baseline: 0/10 on eval, 5/10 at worst Goal status: MET - 04/19/23 - 40% improvement  LONG TERM GOALS: Target date: 05/12/2023  Patient will be independent with ongoing/advanced HEP for self-management at home.  Baseline:  Goal status: IN PROGRESS - 04/28/23 - met for current HEP, updated today  2.  Patient will demonstrate improved posture to decrease muscle imbalance. Baseline:  Goal status: IN PROGRESS - 04/28/23  3.  Patient will report 50-75% improvement in neck pain to improve QOL.  Baseline: 0/10 on eval, 5/10 at worst Goal status: IN PROGRESS - 04/25/23 - 50% improvement  4.  Patient to report 50-75% reduction in frequency and intensity of weekly headaches/migraines.   Baseline: Occasional moderate intensity headaches Goal status: IN PROGRESS - 04/25/23 - pt reports 50% better  5.  Patient will demonstrate full pain free cervical ROM for safety with driving.  Baseline: Refer to above cervical ROM table Goal status: MET - 04/28/23  6.  Patient will report </= 8% on NDI to demonstrate improved functional ability.  Baseline: 8 / 50 = 16.0 % Goal status: IN PROGRESS  PLAN:  PT FREQUENCY: 2x/week  PT DURATION: 8 weeks  PLANNED INTERVENTIONS: 97164- PT Re-evaluation, 97110-Therapeutic exercises, 97530- Therapeutic activity, 97112- Neuromuscular re-education, 97535- Self Care, 91478- Manual therapy, 97014- Electrical stimulation (unattended), [id number]- Electrical stimulation (manual), [id number]- Ultrasound, 29562- Traction (mechanical), [id number]- Ionotophoresis 4mg /ml Dexamethasone, Patient/Family education, Taping, Dry Needling, Joint mobilization, Spinal mobilization, Cryotherapy, and Moist heat  PLAN FOR NEXT SESSION: progress cervical and thoracic mobility/flexibility as well as postural and scapular strengthening -  review/update HEP PRN; MT +/- TPDN to address abnormal muscle tension as indicated   Marry Guan, PT 04/28/2023, 8:53 AM

## 2023-05-03 ENCOUNTER — Ambulatory Visit

## 2023-05-03 DIAGNOSIS — R293 Abnormal posture: Secondary | ICD-10-CM

## 2023-05-03 DIAGNOSIS — G4486 Cervicogenic headache: Secondary | ICD-10-CM | POA: Diagnosis not present

## 2023-05-03 DIAGNOSIS — M6281 Muscle weakness (generalized): Secondary | ICD-10-CM

## 2023-05-03 DIAGNOSIS — M542 Cervicalgia: Secondary | ICD-10-CM | POA: Diagnosis not present

## 2023-05-03 DIAGNOSIS — M62838 Other muscle spasm: Secondary | ICD-10-CM | POA: Diagnosis not present

## 2023-05-03 NOTE — Therapy (Signed)
 OUTPATIENT PHYSICAL THERAPY TREATMENT   Patient Name: Laura Shields MRN: [id number] DOB:02/19/1955, 68 y.o., female Today's Date: 05/03/2023   END OF SESSION:         Past Medical History:  Diagnosis Date   Arthritis    bil knees   Atrial fibrillation (HCC)    Depression    GERD (gastroesophageal reflux disease)    Sleep apnea    +Bipap nightly   Past Surgical History:  Procedure Laterality Date   BREAST BIOPSY Right 11/03/2020   BREAST BIOPSY Bilateral 11/2020   BREAST EXCISIONAL BIOPSY Right 12/2020   x2   BREAST LUMPECTOMY WITH RADIOACTIVE SEED LOCALIZATION Right 01/06/2021   Procedure: RIGHT BREAST LUMPECTOMY WITH RADIOACTIVE SEED LOCALIZATION X 2;  Surgeon: Harriette Bouillon, MD;  Location: [facility];  Service: General;  Laterality: Right;   ENDOMETRIAL ABLATION     KNEE ARTHROSCOPY Right    TUBAL LIGATION     Patient Active Problem List   Diagnosis Date Noted   Insomnia 10/22/2022   Depression, recurrent (HCC) 03/03/2022   Mixed hyperlipidemia 03/03/2022   Gastroesophageal reflux disease 03/03/2022    PCP: Sharlene Dory, DO   REFERRING PROVIDER: Sharlene Dory, DO  REFERRING DIAG: M54.2 (ICD-10-CM) - Neck pain   THERAPY DIAG:  Cervicalgia  Abnormal posture  Muscle weakness (generalized)  Cervicogenic headache  Other muscle spasm  RATIONALE FOR EVALUATION AND TREATMENT: Rehabilitation  ONSET DATE: August 2024  NEXT MD VISIT: None scheduled / PRN   SUBJECTIVE:                                                                                                                                                                                                         SUBJECTIVE STATEMENT: " Not really pain, still tight on both sides but mostly the L side."  EVAL:  Pt reports intermittent neck pain L>R which radiates into her head.  She reports occasional headaches.  Occasional L>R radicular pain into shoulder/upper  arm but no numbness and tingling.  H/o L wrist fracture followed by L frozen shoulder in 2023.  She notes worsening posture which she would like to keep from progressing.  No particular limitations with any activities reported.  Hand dominance: Right  PAIN: Are you having pain? No and Yes: NPRS scale: 0/10  Pain location: L side of neck into head when present Pain description: more tightness than pain   Aggravating factors: uncertain  Relieving factors: ice, heat, ibuprofen, naproxen   PERTINENT HISTORY:  B knee OA, a-fib, depression, insomnia, GERD, multiple  breast biopsies in 2022 with R breast lumpectomy and radioactive seed localization 12/2020, L wrist fracture & frozen shoulder in 2023  PRECAUTIONS: None  RED FLAGS: None  HAND DOMINANCE: Right  WEIGHT BEARING RESTRICTIONS: No  FALLS:  Has patient fallen in last 6 months? No  LIVING ENVIRONMENT: Lives with: lives with their spouse Lives in: House/apartment  OCCUPATION: Retired  PLOF: Independent and Leisure: travel, gym 5x/wk - [employer], rec bike (1 hr) & TM (30 min), shopping, photography   PATIENT GOALS: "Eliminate neck pain and improve posture - be able to do more."   OBJECTIVE: (objective measures completed at initial evaluation unless otherwise dated)  DIAGNOSTIC FINDINGS:  N/A  PATIENT SURVEYS:  NDI 8 / 50 = 16.0 %  COGNITION: Overall cognitive status: Within functional limits for tasks assessed  SENSATION: WFL  POSTURE:  rounded shoulders, forward head, and increased thoracic kyphosis  PALPATION: Increased muscle tension and mild TTP in L>R UT, LS, rhomboids, teres group, pecs   CERVICAL ROM:   Active ROM Eval 04/28/23  Flexion 40 48  Extension 45 54  Right lateral flexion 26 stretching 35  Left lateral flexion 25 30  Right rotation 70 78  Left rotation 71 77   (Blank rows = not tested)  UPPER EXTREMITY ROM: B UE WNL  UPPER EXTREMITY MMT:  MMT Right eval Left eval R 04/28/23 L  04/28/23  Shoulder flexion 5 4+ 5 5  Shoulder extension 5 5    Shoulder abduction 5 5    Shoulder adduction      Shoulder internal rotation 5 5    Shoulder external rotation 4+ 4+ 5 5  Middle trapezius 4 4 4+ 4+  Lower trapezius 3- 3- 3+ 3+   (Blank rows = not tested)  CERVICAL SPECIAL TESTS:  Spurling's test: Negative and Distraction test: Negative   TODAY'S TREATMENT:  05/03/23 THERAPEUTIC EXERCISE: To improve strength and endurance.  Demonstration, verbal and tactile cues throughout for technique.  UBE - L3.0 x 6 min (3' each fwd & back) Standing shoulder flexion 2lb 2 x 10 B Standing shoulder scaption 2lb 2 x 10 B Seated flexion stretch with green pball 2x30"  NEUROMUSCULAR RE-EDUCATION: To improve coordination, kinesthesia, and posture.  Lower trap setting - V wall slide with looped YTB at wrists + slight lift-off wall at terminal ROM 2 x 10 Wall clocks YTB around wrist: lateral x 10, scaption x 10, low diagonal x 10 B Prone over green Pball - thoracic extension + scap retraction + I's, T's, Y's and W's 2 x 10 each Seated thoracic ext x 10 with shoulder flexion  04/28/2023  THERAPEUTIC EXERCISE: To improve strength and endurance.  Demonstration, verbal and tactile cues throughout for technique.  UBE - L2.0 x 6 min (3' each fwd & back) Standing YTB B scaption 2 x 10 Standing YTB B shoulder flexion 2 x 10  Cervical ROM UE MMT  NEUROMUSCULAR RE-EDUCATION: To improve coordination, kinesthesia, and posture.  Lower trap setting - V wall slide with looped YTB at wrists + slight lift-off wall at terminal ROM 2 x 10 Standing GTB scap retraction + B shoulder W row 10 x 3" Standing GTB scap retraction + B lat pull down 10 x 3" Prone over green Pball - thoracic extension + scap retraction + I's, T's, Y's and W's x 10 each   04/25/23 THERAPEUTIC EXERCISE: To improve strength and endurance.   UBE - L1.5 x 6 min (3' each fwd & back) Standing ER  YTB 10x3"; 2 sets R/L Supine on foam  roll: - bil shld flexion 10x5" -thoracic hugs 10x5" - chest stretch 2x30" -horizontal ABD x 10 Seated flexion stretch with green pball 2x30" Therapeutic Activity: to improve functional performance. Lat pulls 25lb 2x10 Standing mid rows 5lb 2x10 Standing horizontal ABD YTB 2 x 10   04/21/23 THERAPEUTIC EXERCISE: To improve strength and endurance.   UBE - L1.5 x 6 min (3' each fwd & back) UT and LS stretch x 30" bil  Therapeutic Activity: to improve functional performance. Lat pulls 20lb 2x10  Shld ext 15lb 2x10 Seated rows 20lb 2x10 low grips Wall angels x 12 Manual Therapy: STM to L LS, scalenes Moist Heat: x 10 min to L UT/LS area   PATIENT EDUCATION:  Education details: HEP update - lower trap setting and postural awareness   Person educated: Patient Education method: Explanation, Demonstration, Verbal cues, and Handouts Education comprehension: verbalized understanding, returned demonstration, verbal cues required, and needs further education  HOME EXERCISE PROGRAM: Access Code: [email or web address] URL: [email or web address] Date: 04/28/2023 Prepared by: Glenetta Hew  Exercises - Supine Cervical Retraction with Towel  - 2 x daily - 7 x weekly - 2 sets - 10 reps - 5 sec hold - Seated Cervical Retraction  - 2 x daily - 7 x weekly - 2 sets - 10 reps - 5 sec hold - Seated Thoracic Lumbar Extension with Pectoralis Stretch  - 2 x daily - 7 x weekly - 2 sets - 10 reps - 5 sec hold - Seated Gentle Upper Trapezius Stretch  - 2 x daily - 7 x weekly - 3 reps - 30 sec hold - Gentle Levator Scapulae Stretch  - 2 x daily - 7 x weekly - 3 reps - 30 sec hold - Shoulder External Rotation and Scapular Retraction with Resistance  - 1 x daily - 3 x weekly - 2 sets - 10 reps - 3-5 sec hold - Standing Shoulder Horizontal Abduction with Resistance  - 1 x daily - 3 x weekly - 2 sets - 10 reps - 3 sec hold - Standing Shoulder Diagonal Horizontal Abduction 60/120 Degrees with Resistance  - 1  x daily - 3 x weekly - 2 sets - 10 reps - 3 sec hold - Seated Scalene Stretch with Towel  - 2 x daily - 7 x weekly - 3 reps - 30 sec hold - Mid-Lower Cervical Extension SNAG with Strap  - 1 x daily - 7 x weekly - 2 sets - 10 reps - 3 sec hold - Cervical Retraction Prone on Elbows  - 1 x daily - 7 x weekly - 2 sets - 10 reps - 3 sec hold - Wall Push Up with Plus  - 1 x daily - 7 x weekly - 2 sets - 10 reps - 3 sec hold - Wall Clock with Theraband  - 1 x daily - 3 x weekly - 2 sets - 10 reps - 3 sec hold - Standing Low Trap Setting with Resistance at Wall  - 1 x daily - 3 x weekly - 2 sets - 10 reps - 3 sec hold  Patient Education - Trigger Point Dry Needling   ASSESSMENT:  CLINICAL IMPRESSION:  Pt responded well to treatment. We progressed scapular stabilization exercises for increased motor control of the postural musculature, along with progressing shoulder strengthening. No issues completing the interventions. Cues provided as needed to correct her form.  Laura Shields will benefit from continued skilled PT to address ongoing  deficits to improve posture, mobility and activity tolerance with decreased pain interference.  OBJECTIVE IMPAIRMENTS: decreased activity tolerance, decreased knowledge of condition, decreased ROM, decreased strength, hypomobility, increased fascial restrictions, impaired perceived functional ability, increased muscle spasms, impaired flexibility, impaired UE functional use, improper body mechanics, postural dysfunction, and pain.   ACTIVITY LIMITATIONS:  reading  PARTICIPATION LIMITATIONS: driving, community activity, and recreational activities  PERSONAL FACTORS: Past/current experiences, Time since onset of injury/illness/exacerbation, and 3+ comorbidities: B knee OA, a-fib, depression, insomnia, GERD, multiple breast biopsies in 2022 with R breast lumpectomy and radioactive seed localization 12/2020, L wrist fracture & frozen shoulder in 2023  are also affecting patient's  functional outcome.   REHAB POTENTIAL: Excellent  CLINICAL DECISION MAKING: Stable/uncomplicated  EVALUATION COMPLEXITY: Low   GOALS: Goals reviewed with patient? Yes  SHORT TERM GOALS: Target date: 04/28/2023  Patient will be independent with initial HEP to improve outcomes and carryover.  Baseline:  Goal status: MET - 04/19/23   2.  Patient will report 25% improvement in neck pain to improve QOL.  Baseline: 0/10 on eval, 5/10 at worst Goal status: MET - 04/19/23 - 40% improvement  LONG TERM GOALS: Target date: 05/12/2023  Patient will be independent with ongoing/advanced HEP for self-management at home.  Baseline:  Goal status: IN PROGRESS - 04/28/23 - met for current HEP, updated today  2.  Patient will demonstrate improved posture to decrease muscle imbalance. Baseline:  Goal status: IN PROGRESS - 04/28/23  3.  Patient will report 50-75% improvement in neck pain to improve QOL.  Baseline: 0/10 on eval, 5/10 at worst Goal status: IN PROGRESS - 04/25/23 - 50% improvement  4.  Patient to report 50-75% reduction in frequency and intensity of weekly headaches/migraines.   Baseline: Occasional moderate intensity headaches Goal status: IN PROGRESS - 04/25/23 - pt reports 50% better  5.  Patient will demonstrate full pain free cervical ROM for safety with driving.  Baseline: Refer to above cervical ROM table Goal status: MET - 04/28/23  6.  Patient will report </= 8% on NDI to demonstrate improved functional ability.  Baseline: 8 / 50 = 16.0 % Goal status: IN PROGRESS  PLAN:  PT FREQUENCY: 2x/week  PT DURATION: 8 weeks  PLANNED INTERVENTIONS: 97164- PT Re-evaluation, 97110-Therapeutic exercises, 97530- Therapeutic activity, 97112- Neuromuscular re-education, 97535- Self Care, 78469- Manual therapy, 97014- Electrical stimulation (unattended), [id number]- Electrical stimulation (manual), [id number]- Ultrasound, 62952- Traction (mechanical), [id number]- Ionotophoresis 4mg /ml Dexamethasone,  Patient/Family education, Taping, Dry Needling, Joint mobilization, Spinal mobilization, Cryotherapy, and Moist heat  PLAN FOR NEXT SESSION: progress cervical and thoracic mobility/flexibility as well as postural and scapular strengthening - review/update HEP PRN; MT +/- TPDN to address abnormal muscle tension as indicated   [place] L Seyon Strader, PTA 05/03/2023, 1:16 PM

## 2023-05-05 ENCOUNTER — Ambulatory Visit

## 2023-05-05 DIAGNOSIS — M6281 Muscle weakness (generalized): Secondary | ICD-10-CM | POA: Diagnosis not present

## 2023-05-05 DIAGNOSIS — R293 Abnormal posture: Secondary | ICD-10-CM | POA: Diagnosis not present

## 2023-05-05 DIAGNOSIS — M62838 Other muscle spasm: Secondary | ICD-10-CM

## 2023-05-05 DIAGNOSIS — G4486 Cervicogenic headache: Secondary | ICD-10-CM | POA: Diagnosis not present

## 2023-05-05 DIAGNOSIS — M542 Cervicalgia: Secondary | ICD-10-CM | POA: Diagnosis not present

## 2023-05-05 NOTE — Therapy (Signed)
 OUTPATIENT PHYSICAL THERAPY TREATMENT   Patient Name: Laura Shields MRN: [id number] DOB:09-13-1955, 68 y.o., female Today's Date: 05/05/2023   END OF SESSION:  PT End of Session - 05/05/23 0809     Visit Number 9    Date for PT Re-Evaluation 05/26/23    Authorization Type [employer] & State [employer]    PT Start Time 0805    PT Stop Time 0845    PT Time Calculation (min) 40 min    Activity Tolerance Patient tolerated treatment well    Behavior During Therapy [facility] for tasks assessed/performed                   Past Medical History:  Diagnosis Date   Arthritis    bil knees   Atrial fibrillation (HCC)    Depression    GERD (gastroesophageal reflux disease)    Sleep apnea    +Bipap nightly   Past Surgical History:  Procedure Laterality Date   BREAST BIOPSY Right 11/03/2020   BREAST BIOPSY Bilateral 11/2020   BREAST EXCISIONAL BIOPSY Right 12/2020   x2   BREAST LUMPECTOMY WITH RADIOACTIVE SEED LOCALIZATION Right 01/06/2021   Procedure: RIGHT BREAST LUMPECTOMY WITH RADIOACTIVE SEED LOCALIZATION X 2;  Surgeon: Harriette Bouillon, MD;  Location: [facility];  Service: General;  Laterality: Right;   ENDOMETRIAL ABLATION     KNEE ARTHROSCOPY Right    TUBAL LIGATION     Patient Active Problem List   Diagnosis Date Noted   Insomnia 10/22/2022   Depression, recurrent (HCC) 03/03/2022   Mixed hyperlipidemia 03/03/2022   Gastroesophageal reflux disease 03/03/2022    PCP: Sharlene Dory, DO   REFERRING PROVIDER: Sharlene Dory, DO  REFERRING DIAG: M54.2 (ICD-10-CM) - Neck pain   THERAPY DIAG:  Cervicalgia  Abnormal posture  Muscle weakness (generalized)  Cervicogenic headache  Other muscle spasm  RATIONALE FOR EVALUATION AND TREATMENT: Rehabilitation  ONSET DATE: August 2024  NEXT MD VISIT: None scheduled / PRN   SUBJECTIVE:                                                                                                                                                                                                          SUBJECTIVE STATEMENT: " Feeling pretty good today."  EVAL:  Pt reports intermittent neck pain L>R which radiates into her head.  She reports occasional headaches.  Occasional L>R radicular pain into shoulder/upper arm but no numbness and tingling.  H/o L wrist fracture followed by L frozen shoulder in 2023.  She notes  worsening posture which she would like to keep from progressing.  No particular limitations with any activities reported.  Hand dominance: Right  PAIN: Are you having pain? No and Yes: NPRS scale: 0/10  Pain location: L side of neck into head when present Pain description: more tightness than pain   Aggravating factors: uncertain  Relieving factors: ice, heat, ibuprofen, naproxen   PERTINENT HISTORY:  B knee OA, a-fib, depression, insomnia, GERD, multiple breast biopsies in 2022 with R breast lumpectomy and radioactive seed localization 12/2020, L wrist fracture & frozen shoulder in 2023  PRECAUTIONS: None  RED FLAGS: None  HAND DOMINANCE: Right  WEIGHT BEARING RESTRICTIONS: No  FALLS:  Has patient fallen in last 6 months? No  LIVING ENVIRONMENT: Lives with: lives with their spouse Lives in: House/apartment  OCCUPATION: Retired  PLOF: Independent and Leisure: travel, gym 5x/wk - [employer], rec bike (1 hr) & TM (30 min), shopping, photography   PATIENT GOALS: "Eliminate neck pain and improve posture - be able to do more."   OBJECTIVE: (objective measures completed at initial evaluation unless otherwise dated)  DIAGNOSTIC FINDINGS:  N/A  PATIENT SURVEYS:  NDI 8 / 50 = 16.0 %  COGNITION: Overall cognitive status: Within functional limits for tasks assessed  SENSATION: WFL  POSTURE:  rounded shoulders, forward head, and increased thoracic kyphosis  PALPATION: Increased muscle tension and mild TTP in L>R UT, LS, rhomboids, teres group,  pecs   CERVICAL ROM:   Active ROM Eval 04/28/23  Flexion 40 48  Extension 45 54  Right lateral flexion 26 stretching 35  Left lateral flexion 25 30  Right rotation 70 78  Left rotation 71 77   (Blank rows = not tested)  UPPER EXTREMITY ROM: B UE WNL  UPPER EXTREMITY MMT:  MMT Right eval Left eval R 04/28/23 L 04/28/23  Shoulder flexion 5 4+ 5 5  Shoulder extension 5 5    Shoulder abduction 5 5    Shoulder adduction      Shoulder internal rotation 5 5    Shoulder external rotation 4+ 4+ 5 5  Middle trapezius 4 4 4+ 4+  Lower trapezius 3- 3- 3+ 3+   (Blank rows = not tested)  CERVICAL SPECIAL TESTS:  Spurling's test: Negative and Distraction test: Negative   TODAY'S TREATMENT:  05/05/23 THERAPEUTIC EXERCISE: To improve strength and endurance.  Demonstration, verbal and tactile cues throughout for technique.  UBE - L3.0 x 6 min (3' each fwd & back) Standing shoulder flexion 3lb 2 x 10 bil Standing shoulder abduction 3lb 2 x 10 bil Open books standing x 10 bil  NEUROMUSCULAR RE-EDUCATION: To improve coordination, kinesthesia, and posture.  Standing shld ext with slight abduction GTB 2 x 10 Standing Y raises YTB 2x10- cues to avoid shrugging shoulders Prone over green Pball - thoracic extension + scap retraction + I's, T's, Y's and W's x 10 each 2lb each hand Standing horiz ABD YTB 2 x 10  Standing W back with YTB 2 x 10   05/03/23 THERAPEUTIC EXERCISE: To improve strength and endurance.  Demonstration, verbal and tactile cues throughout for technique.  UBE - L3.0 x 6 min (3' each fwd & back) Standing shoulder flexion 2lb 2 x 10 B Standing shoulder scaption 2lb 2 x 10 B Seated flexion stretch with green pball 2x30"  NEUROMUSCULAR RE-EDUCATION: To improve coordination, kinesthesia, and posture.  Lower trap setting - V wall slide with looped YTB at wrists + slight lift-off wall at terminal ROM 2  x 10 Wall clocks YTB around wrist: lateral x 10, scaption x 10, low diagonal  x 10 B Prone over green Pball - thoracic extension + scap retraction + I's, T's, Y's and W's 2 x 10 each Seated thoracic ext x 10 with shoulder flexion  04/28/2023  THERAPEUTIC EXERCISE: To improve strength and endurance.  Demonstration, verbal and tactile cues throughout for technique.  UBE - L2.0 x 6 min (3' each fwd & back) Standing YTB B scaption 2 x 10 Standing YTB B shoulder flexion 2 x 10  Cervical ROM UE MMT  NEUROMUSCULAR RE-EDUCATION: To improve coordination, kinesthesia, and posture.  Lower trap setting - V wall slide with looped YTB at wrists + slight lift-off wall at terminal ROM 2 x 10 Standing GTB scap retraction + B shoulder W row 10 x 3" Standing GTB scap retraction + B lat pull down 10 x 3" Prone over green Pball - thoracic extension + scap retraction + I's, T's, Y's and W's x 10 each   04/25/23 THERAPEUTIC EXERCISE: To improve strength and endurance.   UBE - L1.5 x 6 min (3' each fwd & back) Standing ER YTB 10x3"; 2 sets R/L Supine on foam roll: - bil shld flexion 10x5" -thoracic hugs 10x5" - chest stretch 2x30" -horizontal ABD x 10 Seated flexion stretch with green pball 2x30" Therapeutic Activity: to improve functional performance. Lat pulls 25lb 2x10 Standing mid rows 5lb 2x10 Standing horizontal ABD YTB 2 x 10   04/21/23 THERAPEUTIC EXERCISE: To improve strength and endurance.   UBE - L1.5 x 6 min (3' each fwd & back) UT and LS stretch x 30" bil  Therapeutic Activity: to improve functional performance. Lat pulls 20lb 2x10  Shld ext 15lb 2x10 Seated rows 20lb 2x10 low grips Wall angels x 12 Manual Therapy: STM to L LS, scalenes Moist Heat: x 10 min to L UT/LS area   PATIENT EDUCATION:  Education details: HEP update - lower trap setting and postural awareness   Person educated: Patient Education method: Explanation, Demonstration, Verbal cues, and Handouts Education comprehension: verbalized understanding, returned demonstration, verbal cues  required, and needs further education  HOME EXERCISE PROGRAM: Access Code: [email or web address] URL: [email or web address] Date: 04/28/2023 Prepared by: Glenetta Hew  Exercises - Supine Cervical Retraction with Towel  - 2 x daily - 7 x weekly - 2 sets - 10 reps - 5 sec hold - Seated Cervical Retraction  - 2 x daily - 7 x weekly - 2 sets - 10 reps - 5 sec hold - Seated Thoracic Lumbar Extension with Pectoralis Stretch  - 2 x daily - 7 x weekly - 2 sets - 10 reps - 5 sec hold - Seated Gentle Upper Trapezius Stretch  - 2 x daily - 7 x weekly - 3 reps - 30 sec hold - Gentle Levator Scapulae Stretch  - 2 x daily - 7 x weekly - 3 reps - 30 sec hold - Shoulder External Rotation and Scapular Retraction with Resistance  - 1 x daily - 3 x weekly - 2 sets - 10 reps - 3-5 sec hold - Standing Shoulder Horizontal Abduction with Resistance  - 1 x daily - 3 x weekly - 2 sets - 10 reps - 3 sec hold - Standing Shoulder Diagonal Horizontal Abduction 60/120 Degrees with Resistance  - 1 x daily - 3 x weekly - 2 sets - 10 reps - 3 sec hold - Seated Scalene Stretch with Towel  - 2 x daily - 7 x weekly -  3 reps - 30 sec hold - Mid-Lower Cervical Extension SNAG with Strap  - 1 x daily - 7 x weekly - 2 sets - 10 reps - 3 sec hold - Cervical Retraction Prone on Elbows  - 1 x daily - 7 x weekly - 2 sets - 10 reps - 3 sec hold - Wall Push Up with Plus  - 1 x daily - 7 x weekly - 2 sets - 10 reps - 3 sec hold - Wall Clock with Theraband  - 1 x daily - 3 x weekly - 2 sets - 10 reps - 3 sec hold - Standing Low Trap Setting with Resistance at Wall  - 1 x daily - 3 x weekly - 2 sets - 10 reps - 3 sec hold  Patient Education - Trigger Point Dry Needling   ASSESSMENT:  CLINICAL IMPRESSION:  Pt was able to progress through exercises with no issues. Progressed resistance with most exercises today. She is no longer noticing headaches now, (none in past few weeks). Cues provided to correct form with exercises.  [place] will  benefit from continued skilled PT to address ongoing deficits to improve posture, mobility and activity tolerance with decreased pain interference.  OBJECTIVE IMPAIRMENTS: decreased activity tolerance, decreased knowledge of condition, decreased ROM, decreased strength, hypomobility, increased fascial restrictions, impaired perceived functional ability, increased muscle spasms, impaired flexibility, impaired UE functional use, improper body mechanics, postural dysfunction, and pain.   ACTIVITY LIMITATIONS:  reading  PARTICIPATION LIMITATIONS: driving, community activity, and recreational activities  PERSONAL FACTORS: Past/current experiences, Time since onset of injury/illness/exacerbation, and 3+ comorbidities: B knee OA, a-fib, depression, insomnia, GERD, multiple breast biopsies in 2022 with R breast lumpectomy and radioactive seed localization 12/2020, L wrist fracture & frozen shoulder in 2023  are also affecting patient's functional outcome.   REHAB POTENTIAL: Excellent  CLINICAL DECISION MAKING: Stable/uncomplicated  EVALUATION COMPLEXITY: Low   GOALS: Goals reviewed with patient? Yes  SHORT TERM GOALS: Target date: 04/28/2023  Patient will be independent with initial HEP to improve outcomes and carryover.  Baseline:  Goal status: MET - 04/19/23   2.  Patient will report 25% improvement in neck pain to improve QOL.  Baseline: 0/10 on eval, 5/10 at worst Goal status: MET - 04/19/23 - 40% improvement  LONG TERM GOALS: Target date: 05/12/2023  Patient will be independent with ongoing/advanced HEP for self-management at home.  Baseline:  Goal status: IN PROGRESS - 04/28/23 - met for current HEP, updated today  2.  Patient will demonstrate improved posture to decrease muscle imbalance. Baseline:  Goal status: IN PROGRESS - 04/28/23  3.  Patient will report 50-75% improvement in neck pain to improve QOL.  Baseline: 0/10 on eval, 5/10 at worst Goal status: IN PROGRESS - 04/25/23 -  50% improvement  4.  Patient to report 50-75% reduction in frequency and intensity of weekly headaches/migraines.   Baseline: Occasional moderate intensity headaches Goal status: MET- 05/05/23   5.  Patient will demonstrate full pain free cervical ROM for safety with driving.  Baseline: Refer to above cervical ROM table Goal status: MET - 04/28/23  6.  Patient will report </= 8% on NDI to demonstrate improved functional ability.  Baseline: 8 / 50 = 16.0 % Goal status: IN PROGRESS  PLAN:  PT FREQUENCY: 2x/week  PT DURATION: 8 weeks  PLANNED INTERVENTIONS: 97164- PT Re-evaluation, 97110-Therapeutic exercises, 97530- Therapeutic activity, 97112- Neuromuscular re-education, 97535- Self Care, 30160- Manual therapy, 97014- Electrical stimulation (unattended), [id number]- Electrical stimulation (manual), [id number]-  Ultrasound, [id number]- Traction (mechanical), [id number]- Ionotophoresis 4mg /ml Dexamethasone, Patient/Family education, Taping, Dry Needling, Joint mobilization, Spinal mobilization, Cryotherapy, and Moist heat  PLAN FOR NEXT SESSION: progress cervical and thoracic mobility/flexibility as well as postural and scapular strengthening - review/update HEP PRN; MT +/- TPDN to address abnormal muscle tension as indicated   Jeannie Mallinger L Hayly Litsey, PTA 05/05/2023, 8:45 AM

## 2023-05-10 ENCOUNTER — Encounter: Payer: Self-pay | Admitting: Physical Therapy

## 2023-05-10 ENCOUNTER — Ambulatory Visit: Admitting: Physical Therapy

## 2023-05-10 DIAGNOSIS — M6281 Muscle weakness (generalized): Secondary | ICD-10-CM

## 2023-05-10 DIAGNOSIS — R293 Abnormal posture: Secondary | ICD-10-CM | POA: Diagnosis not present

## 2023-05-10 DIAGNOSIS — M62838 Other muscle spasm: Secondary | ICD-10-CM

## 2023-05-10 DIAGNOSIS — M542 Cervicalgia: Secondary | ICD-10-CM | POA: Diagnosis not present

## 2023-05-10 DIAGNOSIS — G4486 Cervicogenic headache: Secondary | ICD-10-CM | POA: Diagnosis not present

## 2023-05-10 DIAGNOSIS — Z85828 Personal history of other malignant neoplasm of skin: Secondary | ICD-10-CM | POA: Diagnosis not present

## 2023-05-10 NOTE — Therapy (Signed)
 OUTPATIENT PHYSICAL THERAPY TREATMENT  Progress Note  Reporting Period 03/31/23 to 05/10/2023   See note below for Objective Data and Assessment of Progress/Goals.     Patient Name: Laura Shields MRN: [id number] DOB:[birth year], 68 y.o., female Today's Date: 05/10/2023   END OF SESSION:  PT End of Session - 05/10/23 0933     Visit Number 10    Date for PT Re-Evaluation 05/26/23    Authorization Type [employer] & State [employer]    PT Start Time [phone]    PT Stop Time 1016    PT Time Calculation (min) 43 min    Activity Tolerance Patient tolerated treatment well    Behavior During Therapy [facility] for tasks assessed/performed                    Past Medical History:  Diagnosis Date   Arthritis    bil knees   Atrial fibrillation (HCC)    Depression    GERD (gastroesophageal reflux disease)    Sleep apnea    +Bipap nightly   Past Surgical History:  Procedure Laterality Date   BREAST BIOPSY Right 11/03/2020   BREAST BIOPSY Bilateral 11/2020   BREAST EXCISIONAL BIOPSY Right 12/2020   x2   BREAST LUMPECTOMY WITH RADIOACTIVE SEED LOCALIZATION Right 01/06/2021   Procedure: RIGHT BREAST LUMPECTOMY WITH RADIOACTIVE SEED LOCALIZATION X 2;  Surgeon: Harriette Bouillon, MD;  Location: [facility];  Service: General;  Laterality: Right;   ENDOMETRIAL ABLATION     KNEE ARTHROSCOPY Right    TUBAL LIGATION     Patient Active Problem List   Diagnosis Date Noted   Insomnia 10/22/2022   Depression, recurrent (HCC) 03/03/2022   Mixed hyperlipidemia 03/03/2022   Gastroesophageal reflux disease 03/03/2022    PCP: Sharlene Dory, DO   REFERRING PROVIDER: Sharlene Dory, DO  REFERRING DIAG: M54.2 (ICD-10-CM) - Neck pain   THERAPY DIAG:  Cervicalgia  Abnormal posture  Muscle weakness (generalized)  Cervicogenic headache  Other muscle spasm  RATIONALE FOR EVALUATION AND TREATMENT: Rehabilitation  ONSET DATE: August 2024  NEXT MD VISIT:  None scheduled / PRN   SUBJECTIVE:                                                                                                                                                                                                         SUBJECTIVE STATEMENT: Pt reports no recent pain, only tightness but this is much improved from what it was.  EVAL:  Pt reports intermittent neck pain L>R which radiates into  her head.  She reports occasional headaches.  Occasional L>R radicular pain into shoulder/upper arm but no numbness and tingling.  H/o L wrist fracture followed by L frozen shoulder in 2023.  She notes worsening posture which she would like to keep from progressing.  No particular limitations with any activities reported.  Hand dominance: Right  PAIN: Are you having pain? No and Yes: NPRS scale: 0/10  Pain location: L side of neck into head when present Pain description: more tightness than pain   Aggravating factors: uncertain  Relieving factors: ice, heat, ibuprofen, naproxen   PERTINENT HISTORY:  B knee OA, a-fib, depression, insomnia, GERD, multiple breast biopsies in 2022 with R breast lumpectomy and radioactive seed localization 12/2020, L wrist fracture & frozen shoulder in 2023  PRECAUTIONS: None  RED FLAGS: None  HAND DOMINANCE: Right  WEIGHT BEARING RESTRICTIONS: No  FALLS:  Has patient fallen in last 6 months? No  LIVING ENVIRONMENT: Lives with: lives with their spouse Lives in: House/apartment  OCCUPATION: Retired  PLOF: Independent and Leisure: travel, gym 5x/wk - [employer], rec bike (1 hr) & TM (30 min), shopping, photography   PATIENT GOALS: "Eliminate neck pain and improve posture - be able to do more."   OBJECTIVE: (objective measures completed at initial evaluation unless otherwise dated)  DIAGNOSTIC FINDINGS:  N/A  PATIENT SURVEYS:  NDI 8 / 50 = 16.0 %  COGNITION: Overall cognitive status: Within functional limits for tasks  assessed  SENSATION: WFL  POSTURE:  rounded shoulders, forward head, and increased thoracic kyphosis  PALPATION: Increased muscle tension and mild TTP in L>R UT, LS, rhomboids, teres group, pecs   CERVICAL ROM:   Active ROM Eval 04/28/23  Flexion 40 48  Extension 45 54  Right lateral flexion 26 stretching 35  Left lateral flexion 25 30  Right rotation 70 78  Left rotation 71 77   (Blank rows = not tested)  UPPER EXTREMITY ROM: B UE WNL  UPPER EXTREMITY MMT:  MMT Right eval Left eval R 04/28/23 L 04/28/23  Shoulder flexion 5 4+ 5 5  Shoulder extension 5 5    Shoulder abduction 5 5    Shoulder adduction      Shoulder internal rotation 5 5    Shoulder external rotation 4+ 4+ 5 5  Middle trapezius 4 4 4+ 4+  Lower trapezius 3- 3- 3+ 3+   (Blank rows = not tested)  CERVICAL SPECIAL TESTS:  Spurling's test: Negative and Distraction test: Negative   TODAY'S TREATMENT:   05/10/23 THERAPEUTIC EXERCISE: To improve strength and endurance.  Demonstration, verbal and tactile cues throughout for technique.  UBE - L3.0 x 6 min (3' each fwd & back)  THERAPEUTIC ACTIVITIES: To improve functional performance.  Demonstration, verbal and tactile cues throughout for technique. NDI = 3 / 50 = 6.0 % Goal assessment  MANUAL THERAPY: To promote normalized muscle tension, improved flexibility, increased ROM, and reduced pain. Trigger Point Dry Needling: Treatment instructions/education: Subsequent Treatment: Instructions provided previously at initial dry needling treatment.  Education Handout Provided: Previously Provided Consent: Patient Verbal Consent Given: Yes Treatment: Muscles Treated: B UT x 3 sticks, B subscapularis Skilled palpation and monitoring of soft tissue during DN Electrical Stimulation Performed: No Treatment Response/Outcome: Twitch response elicited, Palpable increase in muscle length, Decreased tissue resistance noted, Decreased TTP, and Improved exercise  tolerance STM/DTM, manual TPR and pin & stretch to muscles addressed with DN  NEUROMUSCULAR RE-EDUCATION: To improve coordination, kinesthesia, and posture.  Lower trap setting -  V wall slide with looped RTB at wrists + slight lift-off wall at terminal ROM 2 x 10 3-way serratus wall clocks with looped RTB around wrists: lateral x 10, scaption x 10, low diagonal x 10 B   05/05/23 THERAPEUTIC EXERCISE: To improve strength and endurance.  Demonstration, verbal and tactile cues throughout for technique.  UBE - L3.0 x 6 min (3' each fwd & back) Standing shoulder flexion 3lb 2 x 10 bil Standing shoulder abduction 3lb 2 x 10 bil Open books standing x 10 bil  NEUROMUSCULAR RE-EDUCATION: To improve coordination, kinesthesia, and posture.  Standing shld ext with slight abduction GTB 2 x 10 Standing Y raises YTB 2x10- cues to avoid shrugging shoulders Prone over green Pball - thoracic extension + scap retraction + I's, T's, Y's and W's x 10 each 2lb each hand Standing horiz ABD YTB 2 x 10  Standing W back with YTB 2 x 10    05/03/23 THERAPEUTIC EXERCISE: To improve strength and endurance.  Demonstration, verbal and tactile cues throughout for technique.  UBE - L3.0 x 6 min (3' each fwd & back) Standing shoulder flexion 2lb 2 x 10 B Standing shoulder scaption 2lb 2 x 10 B Seated flexion stretch with green pball 2x30"  NEUROMUSCULAR RE-EDUCATION: To improve coordination, kinesthesia, and posture.  Lower trap setting - V wall slide with looped YTB at wrists + slight lift-off wall at terminal ROM 2 x 10 Wall clocks YTB around wrist: lateral x 10, scaption x 10, low diagonal x 10 B Prone over green Pball - thoracic extension + scap retraction + I's, T's, Y's and W's 2 x 10 each Seated thoracic ext x 10 with shoulder flexion   PATIENT EDUCATION:  Education details: HEP update - lower trap setting and postural awareness   Person educated: Patient Education method: Explanation, Demonstration,  Verbal cues, and Handouts Education comprehension: verbalized understanding, returned demonstration, verbal cues required, and needs further education  HOME EXERCISE PROGRAM: Access Code: [email or web address] URL: [email or web address] Date: 04/28/2023 Prepared by: Glenetta Hew  Exercises - Supine Cervical Retraction with Towel  - 2 x daily - 7 x weekly - 2 sets - 10 reps - 5 sec hold - Seated Cervical Retraction  - 2 x daily - 7 x weekly - 2 sets - 10 reps - 5 sec hold - Seated Thoracic Lumbar Extension with Pectoralis Stretch  - 2 x daily - 7 x weekly - 2 sets - 10 reps - 5 sec hold - Seated Gentle Upper Trapezius Stretch  - 2 x daily - 7 x weekly - 3 reps - 30 sec hold - Gentle Levator Scapulae Stretch  - 2 x daily - 7 x weekly - 3 reps - 30 sec hold - Shoulder External Rotation and Scapular Retraction with Resistance  - 1 x daily - 3 x weekly - 2 sets - 10 reps - 3-5 sec hold - Standing Shoulder Horizontal Abduction with Resistance  - 1 x daily - 3 x weekly - 2 sets - 10 reps - 3 sec hold - Standing Shoulder Diagonal Horizontal Abduction 60/120 Degrees with Resistance  - 1 x daily - 3 x weekly - 2 sets - 10 reps - 3 sec hold - Seated Scalene Stretch with Towel  - 2 x daily - 7 x weekly - 3 reps - 30 sec hold - Mid-Lower Cervical Extension SNAG with Strap  - 1 x daily - 7 x weekly - 2 sets - 10 reps - 3 sec hold -  Cervical Retraction Prone on Elbows  - 1 x daily - 7 x weekly - 2 sets - 10 reps - 3 sec hold - Wall Push Up with Plus  - 1 x daily - 7 x weekly - 2 sets - 10 reps - 3 sec hold - Wall Clock with Theraband  - 1 x daily - 3 x weekly - 2 sets - 10 reps - 3 sec hold - Standing Low Trap Setting with Resistance at Wall  - 1 x daily - 3 x weekly - 2 sets - 10 reps - 3 sec hold  Patient Education - Trigger Point Dry Needling   ASSESSMENT:  CLINICAL IMPRESSION:  Laura Shields reports pain 85% improved and headaches 95% improved since start of PT but still feels limited with overhead  activities due to tightness. NDI improved by 10% to only 6% perceived disability.  Tightness/abnormal muscle tension predominantly in B UT and subscapularis, which was addressed with MT incorporating DN with good twitch responses elicited resulting in palpable reduction in muscle tension and improving flexibility noted during exercise attempts.  She reports HEP going well but felt that she might be ready for increased resistance with lower trap setting, therefore increased resistance to RTB.  Laura Shields is demonstrating good progress toward her PT goal and will benefit from continued skilled PT to address ongoing deficits to improve posture, mobility and activity tolerance with decreased pain interference.  OBJECTIVE IMPAIRMENTS: decreased activity tolerance, decreased knowledge of condition, decreased ROM, decreased strength, hypomobility, increased fascial restrictions, impaired perceived functional ability, increased muscle spasms, impaired flexibility, impaired UE functional use, improper body mechanics, postural dysfunction, and pain.   ACTIVITY LIMITATIONS:  reading  PARTICIPATION LIMITATIONS: driving, community activity, and recreational activities  PERSONAL FACTORS: Past/current experiences, Time since onset of injury/illness/exacerbation, and 3+ comorbidities: B knee OA, a-fib, depression, insomnia, GERD, multiple breast biopsies in 2022 with R breast lumpectomy and radioactive seed localization 12/2020, L wrist fracture & frozen shoulder in 2023  are also affecting patient's functional outcome.   REHAB POTENTIAL: Excellent  CLINICAL DECISION MAKING: Stable/uncomplicated  EVALUATION COMPLEXITY: Low   GOALS: Goals reviewed with patient? Yes  SHORT TERM GOALS: Target date: 04/28/2023  Patient will be independent with initial HEP to improve outcomes and carryover.  Baseline:  Goal status: MET - 04/19/23   2.  Patient will report 25% improvement in neck pain to improve QOL.  Baseline: 0/10 on  eval, 5/10 at worst Goal status: MET - 04/19/23 - 40% improvement  LONG TERM GOALS: Target date: 05/12/2023  Patient will be independent with ongoing/advanced HEP for self-management at home.  Baseline:  Goal status: IN PROGRESS - 05/10/23 - met for current HEP, updated today  2.  Patient will demonstrate improved posture to decrease muscle imbalance. Baseline:  Goal status: IN PROGRESS - 05/10/23 - pt reports 50% improved awareness of posture and states her husband notes improvement in her posture  3.  Patient will report 50-75% improvement in neck pain to improve QOL.  Baseline: 0/10 on eval, 5/10 at worst Goal status: MET - 05/10/23 - 85% improvement  4.  Patient to report 50-75% reduction in frequency and intensity of weekly headaches/migraines.   Baseline: Occasional moderate intensity headaches Goal status: MET - 05/05/23 - 95% improvement  5.  Patient will demonstrate full pain free cervical ROM for safety with driving.  Baseline: Refer to above cervical ROM table Goal status: MET - 04/28/23  6.  Patient will report </= 8% on NDI to demonstrate improved functional  ability.  Baseline: 8 / 50 = 16.0 % Goal status: MET - 05/10/23 - 3 / 50 = 6.0 %  PLAN:  PT FREQUENCY: 2x/week  PT DURATION: 8 weeks  PLANNED INTERVENTIONS: 97164- PT Re-evaluation, 97110-Therapeutic exercises, 97530- Therapeutic activity, 97112- Neuromuscular re-education, 97535- Self Care, [id number]- Manual therapy, 97014- Electrical stimulation (unattended), [id number]- Electrical stimulation (manual), [id number]- Ultrasound, [id number]- Traction (mechanical), [id number]- Ionotophoresis 4mg /ml Dexamethasone, Patient/Family education, Taping, Dry Needling, Joint mobilization, Spinal mobilization, Cryotherapy, and Moist heat  PLAN FOR NEXT SESSION: progress cervical and thoracic mobility/flexibility as well as postural and scapular strengthening - review/update HEP PRN; MT +/- TPDN to address abnormal muscle tension as indicated   Marry Guan, PT 05/10/2023, 10:25 AM

## 2023-05-12 ENCOUNTER — Ambulatory Visit: Admitting: Physical Therapy

## 2023-05-12 DIAGNOSIS — M62838 Other muscle spasm: Secondary | ICD-10-CM

## 2023-05-12 DIAGNOSIS — M6281 Muscle weakness (generalized): Secondary | ICD-10-CM

## 2023-05-12 DIAGNOSIS — G4486 Cervicogenic headache: Secondary | ICD-10-CM

## 2023-05-12 DIAGNOSIS — M542 Cervicalgia: Secondary | ICD-10-CM | POA: Diagnosis not present

## 2023-05-12 DIAGNOSIS — R293 Abnormal posture: Secondary | ICD-10-CM

## 2023-05-12 NOTE — Therapy (Addendum)
 OUTPATIENT PHYSICAL THERAPY TREATMENT    Patient Name: Laura Shields MRN: [id number] DOB:12/29/1955, 68 y.o., female Today's Date: 05/12/2023   END OF SESSION:  PT End of Session - 05/12/23 1013     Visit Number 11    Date for PT Re-Evaluation 05/26/23    Authorization Type [employer] & State [employer]    PT Start Time 0930    PT Stop Time 1012    PT Time Calculation (min) 42 min    Activity Tolerance Patient tolerated treatment well    Behavior During Therapy WFL for tasks assessed/performed                     Past Medical History:  Diagnosis Date   Arthritis    bil knees   Atrial fibrillation (HCC)    Depression    GERD (gastroesophageal reflux disease)    Sleep apnea    +Bipap nightly   Past Surgical History:  Procedure Laterality Date   BREAST BIOPSY Right 11/03/2020   BREAST BIOPSY Bilateral 11/2020   BREAST EXCISIONAL BIOPSY Right 12/2020   x2   BREAST LUMPECTOMY WITH RADIOACTIVE SEED LOCALIZATION Right 01/06/2021   Procedure: RIGHT BREAST LUMPECTOMY WITH RADIOACTIVE SEED LOCALIZATION X 2;  Surgeon: Harriette Bouillon, MD;  Location: [facility];  Service: General;  Laterality: Right;   ENDOMETRIAL ABLATION     KNEE ARTHROSCOPY Right    TUBAL LIGATION     Patient Active Problem List   Diagnosis Date Noted   Insomnia 10/22/2022   Depression, recurrent (HCC) 03/03/2022   Mixed hyperlipidemia 03/03/2022   Gastroesophageal reflux disease 03/03/2022    PCP: Sharlene Dory, DO   REFERRING PROVIDER: Sharlene Dory, DO  REFERRING DIAG: M54.2 (ICD-10-CM) - Neck pain   THERAPY DIAG:  Cervicalgia  Abnormal posture  Muscle weakness (generalized)  Other muscle spasm  Cervicogenic headache  RATIONALE FOR EVALUATION AND TREATMENT: Rehabilitation  ONSET DATE: August 2024  NEXT MD VISIT: None scheduled / PRN   SUBJECTIVE:                                                                                                                                                                                                          SUBJECTIVE STATEMENT: Pt reports no recent pain, only soreness after dry needling but has seen improvements from what it was.  EVAL:  Pt reports intermittent neck pain L>R which radiates into her head.  She reports occasional headaches.  Occasional L>R radicular pain into shoulder/upper arm but no numbness and  tingling.  H/o L wrist fracture followed by L frozen shoulder in 2023.  She notes worsening posture which she would like to keep from progressing.  No particular limitations with any activities reported.  Hand dominance: Right  PAIN: Are you having pain? No and Yes: NPRS scale: 0/10  Pain location: L side of neck into head when present Pain description: more tightness than pain   Aggravating factors: uncertain  Relieving factors: ice, heat, ibuprofen, naproxen   PERTINENT HISTORY:  B knee OA, a-fib, depression, insomnia, GERD, multiple breast biopsies in 2022 with R breast lumpectomy and radioactive seed localization 12/2020, L wrist fracture & frozen shoulder in 2023  PRECAUTIONS: None  RED FLAGS: None  HAND DOMINANCE: Right  WEIGHT BEARING RESTRICTIONS: No  FALLS:  Has patient fallen in last 6 months? No  LIVING ENVIRONMENT: Lives with: lives with their spouse Lives in: House/apartment  OCCUPATION: Retired  PLOF: Independent and Leisure: travel, gym 5x/wk - [employer], rec bike (1 hr) & TM (30 min), shopping, photography   PATIENT GOALS: "Eliminate neck pain and improve posture - be able to do more."   OBJECTIVE: (objective measures completed at initial evaluation unless otherwise dated)  DIAGNOSTIC FINDINGS:  N/A  PATIENT SURVEYS:  NDI 8 / 50 = 16.0 %  COGNITION: Overall cognitive status: Within functional limits for tasks assessed  SENSATION: WFL  POSTURE:  rounded shoulders, forward head, and increased thoracic  kyphosis  PALPATION: Increased muscle tension and mild TTP in L>R UT, LS, rhomboids, teres group, pecs   CERVICAL ROM:   Active ROM Eval 04/28/23  Flexion 40 48  Extension 45 54  Right lateral flexion 26 stretching 35  Left lateral flexion 25 30  Right rotation 70 78  Left rotation 71 77   (Blank rows = not tested)  UPPER EXTREMITY ROM: B UE WNL  UPPER EXTREMITY MMT:  MMT Right eval Left eval R 04/28/23 L 04/28/23  Shoulder flexion 5 4+ 5 5  Shoulder extension 5 5    Shoulder abduction 5 5    Shoulder adduction      Shoulder internal rotation 5 5    Shoulder external rotation 4+ 4+ 5 5  Middle trapezius 4 4 4+ 4+  Lower trapezius 3- 3- 3+ 3+   (Blank rows = not tested)  CERVICAL SPECIAL TESTS:  Spurling's test: Negative and Distraction test: Negative   TODAY'S TREATMENT:   05/12/23  THERAPEUTIC EXERCISE: To improve strength and endurance.  Demonstration, verbal and tactile cues throughout for technique. UBE - L3.0 x 6 min (3' each fwd & back) Standing YTB cervical retraction isometric 10 x 5" Standing YTB cervical retraction resisted ROM 10 x 5"  NEUROMUSCULAR RE-EDUCATION: To improve coordination, kinesthesia, posture, and proprioception.  Standing B scaption and flexion w/ YTB with PT monitoring and facilitating coordinated scapular motion x 10 Standing B abduction w/ YTB with PT facilitating coordinated scapular motion x 10 Prone over green Pball + foam pad under knees - thoracic extension + scap retraction + I's, T's, Y's and W's x 10  Standing shoulder PNF D1 & D2 flex + ext x10 ea arm Wall pushup on green Pball x 10 Standing rhythmic stabilization with shoulder at ~90 flexion and hand on green Pball with PT perturbations 2 x 20" bil    05/10/23 THERAPEUTIC EXERCISE: To improve strength and endurance.  Demonstration, verbal and tactile cues throughout for technique.  UBE - L3.0 x 6 min (3' each fwd & back)  THERAPEUTIC ACTIVITIES: To  improve functional  performance.  Demonstration, verbal and tactile cues throughout for technique. NDI = 3 / 50 = 6.0 % Goal assessment  MANUAL THERAPY: To promote normalized muscle tension, improved flexibility, increased ROM, and reduced pain. Trigger Point Dry Needling: Treatment instructions/education: Subsequent Treatment: Instructions provided previously at initial dry needling treatment.  Education Handout Provided: Previously Provided Consent: Patient Verbal Consent Given: Yes Treatment: Muscles Treated: B UT x 3 sticks, B subscapularis Skilled palpation and monitoring of soft tissue during DN Electrical Stimulation Performed: No Treatment Response/Outcome: Twitch response elicited, Palpable increase in muscle length, Decreased tissue resistance noted, Decreased TTP, and Improved exercise tolerance STM/DTM, manual TPR and pin & stretch to muscles addressed with DN  NEUROMUSCULAR RE-EDUCATION: To improve coordination, kinesthesia, and posture.  Lower trap setting - V wall slide with looped RTB at wrists + slight lift-off wall at terminal ROM 2 x 10 3-way serratus wall clocks with looped RTB around wrists: lateral x 10, scaption x 10, low diagonal x 10 B   05/05/23 THERAPEUTIC EXERCISE: To improve strength and endurance.  Demonstration, verbal and tactile cues throughout for technique.  UBE - L3.0 x 6 min (3' each fwd & back) Standing shoulder flexion 3lb 2 x 10 bil Standing shoulder abduction 3lb 2 x 10 bil Open books standing x 10 bil  NEUROMUSCULAR RE-EDUCATION: To improve coordination, kinesthesia, and posture.  Standing shld ext with slight abduction GTB 2 x 10 Standing Y raises YTB 2x10- cues to avoid shrugging shoulders Prone over green Pball - thoracic extension + scap retraction + I's, T's, Y's and W's x 10 each 2lb each hand Standing horiz ABD YTB 2 x 10  Standing W back with YTB 2 x 10    05/03/23 THERAPEUTIC EXERCISE: To improve strength and endurance.  Demonstration, verbal and  tactile cues throughout for technique.  UBE - L3.0 x 6 min (3' each fwd & back) Standing shoulder flexion 2lb 2 x 10 B Standing shoulder scaption 2lb 2 x 10 B Seated flexion stretch with green pball 2x30"  NEUROMUSCULAR RE-EDUCATION: To improve coordination, kinesthesia, and posture.  Lower trap setting - V wall slide with looped YTB at wrists + slight lift-off wall at terminal ROM 2 x 10 Wall clocks YTB around wrist: lateral x 10, scaption x 10, low diagonal x 10 B Prone over green Pball - thoracic extension + scap retraction + I's, T's, Y's and W's 2 x 10 each Seated thoracic ext x 10 with shoulder flexion   PATIENT EDUCATION:  Education details: HEP update - lower trap setting and postural awareness   Person educated: Patient Education method: Explanation, Demonstration, Verbal cues, and Handouts Education comprehension: verbalized understanding, returned demonstration, verbal cues required, and needs further education  HOME EXERCISE PROGRAM:  Access Code: [email or web address] URL: [email or web address] Date: 05/12/2023 Prepared by: Glenetta Hew  Exercises - Supine Cervical Retraction with Towel  - 2 x daily - 7 x weekly - 2 sets - 10 reps - 5 sec hold - Seated Cervical Retraction  - 2 x daily - 7 x weekly - 2 sets - 10 reps - 5 sec hold - Seated Thoracic Lumbar Extension with Pectoralis Stretch  - 2 x daily - 7 x weekly - 2 sets - 10 reps - 5 sec hold - Seated Gentle Upper Trapezius Stretch  - 2 x daily - 7 x weekly - 3 reps - 30 sec hold - Gentle Levator Scapulae Stretch  - 2 x daily - 7 x weekly - 3 reps -  30 sec hold - Shoulder External Rotation and Scapular Retraction with Resistance  - 1 x daily - 3 x weekly - 2 sets - 10 reps - 3-5 sec hold - Standing Shoulder Horizontal Abduction with Resistance  - 1 x daily - 3 x weekly - 2 sets - 10 reps - 3 sec hold - Standing Shoulder Diagonal Horizontal Abduction 60/120 Degrees with Resistance  - 1 x daily - 3 x weekly - 2 sets - 10  reps - 3 sec hold - Seated Scalene Stretch with Towel  - 2 x daily - 7 x weekly - 3 reps - 30 sec hold - Mid-Lower Cervical Extension SNAG with Strap  - 1 x daily - 7 x weekly - 2 sets - 10 reps - 3 sec hold - Wall Push Up with Plus  - 1 x daily - 7 x weekly - 2 sets - 10 reps - 3 sec hold - Wall Clock with Theraband  - 1 x daily - 3 x weekly - 2 sets - 10 reps - 3 sec hold - Standing Low Trap Setting with Resistance at Wall  - 1 x daily - 3 x weekly - 2 sets - 10 reps - 3 sec hold - Cervical Retraction with Resistance  - 1 x daily - 3 x weekly - 2 sets - 10 reps - 3-5 sec hold  Patient Education - Trigger Point Dry Needling   ASSESSMENT:  CLINICAL IMPRESSION: Anaisha returns to PT reporting improvement following TPDN but still feels limited with overhead activities due to tightness. She tolerated exercises well, but notices more limitations in L scapular motion compared to R which she attributes to h/o L frozen shoulder. Notes that is harder to lift arm higher due to stiffness with PT noting L scapular dyskinesia; R scapular had more motion compared to L. Gale continues to report HEP is going well and has demonstrated good progress towards her goal. She will continue to benefit from skilled PT to address ongoing deficits to improve posture, mobility deficits and limitations in activity tolerance.  OBJECTIVE IMPAIRMENTS: decreased activity tolerance, decreased knowledge of condition, decreased ROM, decreased strength, hypomobility, increased fascial restrictions, impaired perceived functional ability, increased muscle spasms, impaired flexibility, impaired UE functional use, improper body mechanics, postural dysfunction, and pain.   ACTIVITY LIMITATIONS:  reading  PARTICIPATION LIMITATIONS: driving, community activity, and recreational activities  PERSONAL FACTORS: Past/current experiences, Time since onset of injury/illness/exacerbation, and 3+ comorbidities: B knee OA, a-fib, depression,  insomnia, GERD, multiple breast biopsies in 2022 with R breast lumpectomy and radioactive seed localization 12/2020, L wrist fracture & frozen shoulder in 2023  are also affecting patient's functional outcome.   REHAB POTENTIAL: Excellent  CLINICAL DECISION MAKING: Stable/uncomplicated  EVALUATION COMPLEXITY: Low   GOALS: Goals reviewed with patient? Yes  SHORT TERM GOALS: Target date: 04/28/2023  Patient will be independent with initial HEP to improve outcomes and carryover.  Baseline:  Goal status: MET - 04/19/23   2.  Patient will report 25% improvement in neck pain to improve QOL.  Baseline: 0/10 on eval, 5/10 at worst Goal status: MET - 04/19/23 - 40% improvement  LONG TERM GOALS: Target date: 05/12/2023  Patient will be independent with ongoing/advanced HEP for self-management at home.  Baseline:  Goal status: IN PROGRESS - 05/10/23 - met for current HEP, updated today  2.  Patient will demonstrate improved posture to decrease muscle imbalance. Baseline:  Goal status: IN PROGRESS - 05/10/23 - pt reports 50% improved awareness of  posture and states her husband notes improvement in her posture  3.  Patient will report 50-75% improvement in neck pain to improve QOL.  Baseline: 0/10 on eval, 5/10 at worst Goal status: MET - 05/10/23 - 85% improvement  4.  Patient to report 50-75% reduction in frequency and intensity of weekly headaches/migraines.   Baseline: Occasional moderate intensity headaches Goal status: MET - 05/05/23 - 95% improvement  5.  Patient will demonstrate full pain free cervical ROM for safety with driving.  Baseline: Refer to above cervical ROM table Goal status: MET - 04/28/23  6.  Patient will report </= 8% on NDI to demonstrate improved functional ability.  Baseline: 8 / 50 = 16.0 % Goal status: MET - 05/10/23 - 3 / 50 = 6.0 %  PLAN:  PT FREQUENCY: 2x/week  PT DURATION: 8 weeks  PLANNED INTERVENTIONS: 97164- PT Re-evaluation, 97110-Therapeutic  exercises, 97530- Therapeutic activity, 97112- Neuromuscular re-education, 97535- Self Care, [id number]- Manual therapy, 97014- Electrical stimulation (unattended), [id number]- Electrical stimulation (manual), [id number]- Ultrasound, [id number]- Traction (mechanical), [id number]- Ionotophoresis 4mg /ml Dexamethasone, Patient/Family education, Taping, Dry Needling, Joint mobilization, Spinal mobilization, Cryotherapy, and Moist heat  PLAN FOR NEXT SESSION: Continue to progress cervical and thoracic mobility/flexibility as well as postural and scapular strengthening - review/update HEP PRN; MT +/- TPDN to address abnormal muscle tension as indicated   Cloria Ciresi Joseph-Greene, Student-PT 05/12/2023, 10:47 AM

## 2023-05-16 ENCOUNTER — Encounter: Payer: Self-pay | Admitting: Family Medicine

## 2023-05-17 ENCOUNTER — Encounter

## 2023-05-19 ENCOUNTER — Encounter: Admitting: Physical Therapy

## 2023-05-19 ENCOUNTER — Ambulatory Visit: Attending: Family Medicine

## 2023-05-19 ENCOUNTER — Other Ambulatory Visit: Payer: Self-pay

## 2023-05-19 DIAGNOSIS — R293 Abnormal posture: Secondary | ICD-10-CM | POA: Diagnosis not present

## 2023-05-19 DIAGNOSIS — M542 Cervicalgia: Secondary | ICD-10-CM | POA: Diagnosis not present

## 2023-05-19 DIAGNOSIS — M62838 Other muscle spasm: Secondary | ICD-10-CM | POA: Diagnosis not present

## 2023-05-19 DIAGNOSIS — M6281 Muscle weakness (generalized): Secondary | ICD-10-CM | POA: Insufficient documentation

## 2023-05-19 DIAGNOSIS — G4486 Cervicogenic headache: Secondary | ICD-10-CM | POA: Insufficient documentation

## 2023-05-19 NOTE — Therapy (Signed)
 OUTPATIENT PHYSICAL THERAPY TREATMENT    Patient Name: Laura Shields MRN: [id number] DOB:[birth year], 68 y.o., female Today's Date: 05/19/2023   END OF SESSION:  PT End of Session - 05/19/23 0931     Visit Number 12    Date for PT Re-Evaluation 05/26/23    Authorization Type [employer] & State [employer]    PT Start Time [phone]    PT Stop Time 1014    PT Time Calculation (min) 43 min    Activity Tolerance Patient tolerated treatment well    Behavior During Therapy [facility] for tasks assessed/performed                      Past Medical History:  Diagnosis Date   Arthritis    bil knees   Atrial fibrillation (HCC)    Depression    GERD (gastroesophageal reflux disease)    Sleep apnea    +Bipap nightly   Past Surgical History:  Procedure Laterality Date   BREAST BIOPSY Right 11/03/2020   BREAST BIOPSY Bilateral 11/2020   BREAST EXCISIONAL BIOPSY Right 12/2020   x2   BREAST LUMPECTOMY WITH RADIOACTIVE SEED LOCALIZATION Right 01/06/2021   Procedure: RIGHT BREAST LUMPECTOMY WITH RADIOACTIVE SEED LOCALIZATION X 2;  Surgeon: Harriette Bouillon, MD;  Location:  SURGERY CENTER;  Service: General;  Laterality: Right;   ENDOMETRIAL ABLATION     KNEE ARTHROSCOPY Right    TUBAL LIGATION     Patient Active Problem List   Diagnosis Date Noted   Insomnia 10/22/2022   Depression, recurrent (HCC) 03/03/2022   Mixed hyperlipidemia 03/03/2022   Gastroesophageal reflux disease 03/03/2022    PCP: Sharlene Dory, DO   REFERRING PROVIDER: Sharlene Dory, DO  REFERRING DIAG: M54.2 (ICD-10-CM) - Neck pain   THERAPY DIAG:  Cervicalgia  Abnormal posture  Muscle weakness (generalized)  Other muscle spasm  Cervicogenic headache  RATIONALE FOR EVALUATION AND TREATMENT: Rehabilitation  ONSET DATE: August 2024  NEXT MD VISIT: None scheduled / PRN   SUBJECTIVE:                                                                                                                                                                                                          SUBJECTIVE STATEMENT: Pt reports no recent pain, sore, some tightness starting to creep back in.  EVAL:  Pt reports intermittent neck pain L>R which radiates into her head.  She reports occasional headaches.  Occasional L>R radicular pain into shoulder/upper arm but no numbness and tingling.  H/o L  wrist fracture followed by L frozen shoulder in 2023.  She notes worsening posture which she would like to keep from progressing.  No particular limitations with any activities reported.  Hand dominance: Right  PAIN: Are you having pain? No and Yes: NPRS scale: 0/10  Pain location: L side of neck into head when present Pain description: more tightness than pain   Aggravating factors: uncertain  Relieving factors: ice, heat, ibuprofen, naproxen   PERTINENT HISTORY:  B knee OA, a-fib, depression, insomnia, GERD, multiple breast biopsies in 2022 with R breast lumpectomy and radioactive seed localization 12/2020, L wrist fracture & frozen shoulder in 2023  PRECAUTIONS: None  RED FLAGS: None  HAND DOMINANCE: Right  WEIGHT BEARING RESTRICTIONS: No  FALLS:  Has patient fallen in last 6 months? No  LIVING ENVIRONMENT: Lives with: lives with their spouse Lives in: House/apartment  OCCUPATION: Retired  PLOF: Independent and Leisure: travel, gym 5x/wk - [employer], rec bike (1 hr) & TM (30 min), shopping, photography   PATIENT GOALS: "Eliminate neck pain and improve posture - be able to do more."   OBJECTIVE: (objective measures completed at initial evaluation unless otherwise dated)  DIAGNOSTIC FINDINGS:  N/A  PATIENT SURVEYS:  NDI 8 / 50 = 16.0 %  COGNITION: Overall cognitive status: Within functional limits for tasks assessed  SENSATION: WFL  POSTURE:  rounded shoulders, forward head, and increased thoracic kyphosis  PALPATION: Increased muscle tension and  mild TTP in L>R UT, LS, rhomboids, teres group, pecs   CERVICAL ROM:   Active ROM Eval 04/28/23  Flexion 40 48  Extension 45 54  Right lateral flexion 26 stretching 35  Left lateral flexion 25 30  Right rotation 70 78  Left rotation 71 77   (Blank rows = not tested)  UPPER EXTREMITY ROM: B UE WNL  UPPER EXTREMITY MMT:  MMT Right eval Left eval R 04/28/23 L 04/28/23  Shoulder flexion 5 4+ 5 5  Shoulder extension 5 5    Shoulder abduction 5 5    Shoulder adduction      Shoulder internal rotation 5 5    Shoulder external rotation 4+ 4+ 5 5  Middle trapezius 4 4 4+ 4+  Lower trapezius 3- 3- 3+ 3+   (Blank rows = not tested)  CERVICAL SPECIAL TESTS:  Spurling's test: Negative and Distraction test: Negative   TODAY'S TREATMENT:  05/19/23: Therapeutic activities:  UBE 6 min total, 3 F, 3 B, level 3 Instructed the pt in various stretching, therex to improve thoracic spine posture/ also instructed in variation on strengthening of middle traps, post scapular musculature , she is to differentiate between which exercises she thinks are the most beneficial Supine thoracic roll stretch, longitudinally, parallel to thoracic spine, also perpendicular Instructed in pec stretch, also utilizing B shoulder flexion , added 1# wt for B shoulder flex for increased stretch Inst in supine stretch with roll perpendicular , stretching B pecs with upper arm over roll , also combined with cervical ext/ flex  Supine stretch over physioball 65 cm ball  Prone for horizontal shoulder abd with thumb up mass practice Prone rows with shoulder ER mass practice   05/12/23  THERAPEUTIC EXERCISE: To improve strength and endurance.  Demonstration, verbal and tactile cues throughout for technique. UBE - L3.0 x 6 min (3' each fwd & back) Standing YTB cervical retraction isometric 10 x 5" Standing YTB cervical retraction resisted ROM 10 x 5"  NEUROMUSCULAR RE-EDUCATION: To improve coordination, kinesthesia,  posture, and proprioception.  Standing B scaption and flexion w/ YTB with PT monitoring and facilitating coordinated scapular motion x 10 Standing B abduction w/ YTB with PT facilitating coordinated scapular motion x 10 Prone over green Pball + foam pad under knees - thoracic extension + scap retraction + I's, T's, Y's and W's x 10  Standing shoulder PNF D1 & D2 flex + ext x10 ea arm Wall pushup on green Pball x 10 Standing rhythmic stabilization with shoulder at ~90 flexion and hand on green Pball with PT perturbations 2 x 20" bil    05/10/23 THERAPEUTIC EXERCISE: To improve strength and endurance.  Demonstration, verbal and tactile cues throughout for technique.  UBE - L3.0 x 6 min (3' each fwd & back)  THERAPEUTIC ACTIVITIES: To improve functional performance.  Demonstration, verbal and tactile cues throughout for technique. NDI = 3 / 50 = 6.0 % Goal assessment  MANUAL THERAPY: To promote normalized muscle tension, improved flexibility, increased ROM, and reduced pain. Trigger Point Dry Needling: Treatment instructions/education: Subsequent Treatment: Instructions provided previously at initial dry needling treatment.  Education Handout Provided: Previously Provided Consent: Patient Verbal Consent Given: Yes Treatment: Muscles Treated: B UT x 3 sticks, B subscapularis Skilled palpation and monitoring of soft tissue during DN Electrical Stimulation Performed: No Treatment Response/Outcome: Twitch response elicited, Palpable increase in muscle length, Decreased tissue resistance noted, Decreased TTP, and Improved exercise tolerance STM/DTM, manual TPR and pin & stretch to muscles addressed with DN  NEUROMUSCULAR RE-EDUCATION: To improve coordination, kinesthesia, and posture.  Lower trap setting - V wall slide with looped RTB at wrists + slight lift-off wall at terminal ROM 2 x 10 3-way serratus wall clocks with looped RTB around wrists: lateral x 10, scaption x 10, low diagonal x  10 B   05/05/23 THERAPEUTIC EXERCISE: To improve strength and endurance.  Demonstration, verbal and tactile cues throughout for technique.  UBE - L3.0 x 6 min (3' each fwd & back) Standing shoulder flexion 3lb 2 x 10 bil Standing shoulder abduction 3lb 2 x 10 bil Open books standing x 10 bil  NEUROMUSCULAR RE-EDUCATION: To improve coordination, kinesthesia, and posture.  Standing shld ext with slight abduction GTB 2 x 10 Standing Y raises YTB 2x10- cues to avoid shrugging shoulders Prone over green Pball - thoracic extension + scap retraction + I's, T's, Y's and W's x 10 each 2lb each hand Standing horiz ABD YTB 2 x 10  Standing W back with YTB 2 x 10    05/03/23 THERAPEUTIC EXERCISE: To improve strength and endurance.  Demonstration, verbal and tactile cues throughout for technique.  UBE - L3.0 x 6 min (3' each fwd & back) Standing shoulder flexion 2lb 2 x 10 B Standing shoulder scaption 2lb 2 x 10 B Seated flexion stretch with green pball 2x30"  NEUROMUSCULAR RE-EDUCATION: To improve coordination, kinesthesia, and posture.  Lower trap setting - V wall slide with looped YTB at wrists + slight lift-off wall at terminal ROM 2 x 10 Wall clocks YTB around wrist: lateral x 10, scaption x 10, low diagonal x 10 B Prone over green Pball - thoracic extension + scap retraction + I's, T's, Y's and W's 2 x 10 each Seated thoracic ext x 10 with shoulder flexion   PATIENT EDUCATION:  Education details: HEP update - lower trap setting and postural awareness   Person educated: Patient Education method: Explanation, Demonstration, Verbal cues, and Handouts Education comprehension: verbalized understanding, returned demonstration, verbal cues required, and needs further education  HOME EXERCISE PROGRAM:  Access Code: [email or web address] URL: [email or web address] Date: 05/19/2023 Prepared by: Dyke Weible  Exercises - Supine Cervical Retraction with Towel  - 2 x daily - 7 x weekly - 2 sets  - 10 reps - 5 sec hold - Seated Cervical Retraction  - 2 x daily - 7 x weekly - 2 sets - 10 reps - 5 sec hold - Seated Thoracic Lumbar Extension with Pectoralis Stretch  - 2 x daily - 7 x weekly - 2 sets - 10 reps - 5 sec hold - Seated Gentle Upper Trapezius Stretch  - 2 x daily - 7 x weekly - 3 reps - 30 sec hold - Gentle Levator Scapulae Stretch  - 2 x daily - 7 x weekly - 3 reps - 30 sec hold - Shoulder External Rotation and Scapular Retraction with Resistance  - 1 x daily - 3 x weekly - 2 sets - 10 reps - 3-5 sec hold - Standing Shoulder Horizontal Abduction with Resistance  - 1 x daily - 3 x weekly - 2 sets - 10 reps - 3 sec hold - Standing Shoulder Diagonal Horizontal Abduction 60/120 Degrees with Resistance  - 1 x daily - 3 x weekly - 2 sets - 10 reps - 3 sec hold - Seated Scalene Stretch with Towel  - 2 x daily - 7 x weekly - 3 reps - 30 sec hold - Mid-Lower Cervical Extension SNAG with Strap  - 1 x daily - 7 x weekly - 2 sets - 10 reps - 3 sec hold - Wall Push Up with Plus  - 1 x daily - 7 x weekly - 2 sets - 10 reps - 3 sec hold - Wall Clock with Theraband  - 1 x daily - 3 x weekly - 2 sets - 10 reps - 3 sec hold - Standing Low Trap Setting with Resistance at Wall  - 1 x daily - 3 x weekly - 2 sets - 10 reps - 3 sec hold - Cervical Retraction with Resistance  - 1 x daily - 3 x weekly - 2 sets - 10 reps - 3-5 sec hold - Supine Chest Stretch on Foam Roll  - 1 x daily - 7 x weekly - 3 sets - 10 reps - [place] on Foam Roll  - 1 x daily - 7 x weekly - 3 sets - 10 reps - Thoracic Extension Mobilization on Foam Roll  - 1 x daily - 7 x weekly - 3 sets - 10 reps  Patient Education - Trigger Point Dry Needling Access Code: [email or web address] URL: [email or web address] Date: 05/12/2023 Prepared by: Glenetta Hew  Exercises - Supine Cervical Retraction with Towel  - 2 x daily - 7 x weekly - 2 sets - 10 reps - 5 sec hold - Seated Cervical Retraction  - 2 x daily - 7 x weekly - 2 sets -  10 reps - 5 sec hold - Seated Thoracic Lumbar Extension with Pectoralis Stretch  - 2 x daily - 7 x weekly - 2 sets - 10 reps - 5 sec hold - Seated Gentle Upper Trapezius Stretch  - 2 x daily - 7 x weekly - 3 reps - 30 sec hold - Gentle Levator Scapulae Stretch  - 2 x daily - 7 x weekly - 3 reps - 30 sec hold - Shoulder External Rotation and Scapular Retraction with Resistance  - 1 x daily - 3 x weekly - 2 sets - 10 reps - 3-5 sec hold - Standing Shoulder  Horizontal Abduction with Resistance  - 1 x daily - 3 x weekly - 2 sets - 10 reps - 3 sec hold - Standing Shoulder Diagonal Horizontal Abduction 60/120 Degrees with Resistance  - 1 x daily - 3 x weekly - 2 sets - 10 reps - 3 sec hold - Seated Scalene Stretch with Towel  - 2 x daily - 7 x weekly - 3 reps - 30 sec hold - Mid-Lower Cervical Extension SNAG with Strap  - 1 x daily - 7 x weekly - 2 sets - 10 reps - 3 sec hold - Wall Push Up with Plus  - 1 x daily - 7 x weekly - 2 sets - 10 reps - 3 sec hold - Wall Clock with Theraband  - 1 x daily - 3 x weekly - 2 sets - 10 reps - 3 sec hold - Standing Low Trap Setting with Resistance at Wall  - 1 x daily - 3 x weekly - 2 sets - 10 reps - 3 sec hold - Cervical Retraction with Resistance  - 1 x daily - 3 x weekly - 2 sets - 10 reps - 3-5 sec hold  Patient Education - Trigger Point Dry Needling   ASSESSMENT:  CLINICAL IMPRESSION: Arcenia returns to PT following trip to her father's house to assist with moving him into an assisted living. Reports she notes improving thoracic posture, but wants to prevent further postural changes.  Therefore spent most of today addressing other means of stretching and postural strengthening so that she may determine the most appropriate exercises for her routine.  She is supposed to complete PT next week, and should have a comprehensive home routine to follow once Dc'd.    OBJECTIVE IMPAIRMENTS: decreased activity tolerance, decreased knowledge of condition, decreased ROM,  decreased strength, hypomobility, increased fascial restrictions, impaired perceived functional ability, increased muscle spasms, impaired flexibility, impaired UE functional use, improper body mechanics, postural dysfunction, and pain.   ACTIVITY LIMITATIONS:  reading  PARTICIPATION LIMITATIONS: driving, community activity, and recreational activities  PERSONAL FACTORS: Past/current experiences, Time since onset of injury/illness/exacerbation, and 3+ comorbidities: B knee OA, a-fib, depression, insomnia, GERD, multiple breast biopsies in 2022 with R breast lumpectomy and radioactive seed localization 12/2020, L wrist fracture & frozen shoulder in 2023  are also affecting patient's functional outcome.   REHAB POTENTIAL: Excellent  CLINICAL DECISION MAKING: Stable/uncomplicated  EVALUATION COMPLEXITY: Low   GOALS: Goals reviewed with patient? Yes  SHORT TERM GOALS: Target date: 04/28/2023  Patient will be independent with initial HEP to improve outcomes and carryover.  Baseline:  Goal status: MET - 04/19/23   2.  Patient will report 25% improvement in neck pain to improve QOL.  Baseline: 0/10 on eval, 5/10 at worst Goal status: MET - 04/19/23 - 40% improvement  LONG TERM GOALS: Target date: 05/12/2023  Patient will be independent with ongoing/advanced HEP for self-management at home.  Baseline:  Goal status: IN PROGRESS - 05/10/23 - met for current HEP, updated today  2.  Patient will demonstrate improved posture to decrease muscle imbalance. Baseline:  Goal status: IN PROGRESS - 05/10/23 - pt reports 50% improved awareness of posture and states her husband notes improvement in her posture  3.  Patient will report 50-75% improvement in neck pain to improve QOL.  Baseline: 0/10 on eval, 5/10 at worst Goal status: MET - 05/10/23 - 85% improvement  4.  Patient to report 50-75% reduction in frequency and intensity of weekly headaches/migraines.   Baseline: Occasional  moderate intensity  headaches Goal status: MET - 05/05/23 - 95% improvement  5.  Patient will demonstrate full pain free cervical ROM for safety with driving.  Baseline: Refer to above cervical ROM table Goal status: MET - 04/28/23  6.  Patient will report </= 8% on NDI to demonstrate improved functional ability.  Baseline: 8 / 50 = 16.0 % Goal status: MET - 05/10/23 - 3 / 50 = 6.0 %  PLAN:  PT FREQUENCY: 2x/week  PT DURATION: 8 weeks  PLANNED INTERVENTIONS: 97164- PT Re-evaluation, 97110-Therapeutic exercises, 97530- Therapeutic activity, 97112- Neuromuscular re-education, 97535- Self Care, 56213- Manual therapy, 97014- Electrical stimulation (unattended), [id number]- Electrical stimulation (manual), [id number]- Ultrasound, [id number]- Traction (mechanical), [id number]- Ionotophoresis 4mg /ml Dexamethasone, Patient/Family education, Taping, Dry Needling, Joint mobilization, Spinal mobilization, Cryotherapy, and Moist heat  PLAN FOR NEXT SESSION: reassess goals, and complete HEP instruction  Deward Sebek L Lilo [place], PT, DPT, OCS 05/19/2023, 5:17 PM

## 2023-05-23 ENCOUNTER — Ambulatory Visit: Admitting: Physical Therapy

## 2023-05-23 ENCOUNTER — Encounter: Payer: Self-pay | Admitting: Physical Therapy

## 2023-05-23 DIAGNOSIS — G4486 Cervicogenic headache: Secondary | ICD-10-CM | POA: Diagnosis not present

## 2023-05-23 DIAGNOSIS — M542 Cervicalgia: Secondary | ICD-10-CM | POA: Diagnosis not present

## 2023-05-23 DIAGNOSIS — M6281 Muscle weakness (generalized): Secondary | ICD-10-CM | POA: Diagnosis not present

## 2023-05-23 DIAGNOSIS — R293 Abnormal posture: Secondary | ICD-10-CM | POA: Diagnosis not present

## 2023-05-23 DIAGNOSIS — M62838 Other muscle spasm: Secondary | ICD-10-CM

## 2023-05-23 NOTE — Therapy (Signed)
 OUTPATIENT PHYSICAL THERAPY TREATMENT    Patient Name: Laura Shields MRN: [id number] DOB:1955-02-21, 68 y.o., female Today's Date: 05/23/2023   END OF SESSION:  PT End of Session - 05/23/23 1622     Visit Number 13    Date for PT Re-Evaluation 05/26/23    Authorization Type [employer] & State [employer]    PT Start Time 1619    PT Stop Time 1657    PT Time Calculation (min) 38 min    Activity Tolerance Patient tolerated treatment well    Behavior During Therapy WFL for tasks assessed/performed                       Past Medical History:  Diagnosis Date   Arthritis    bil knees   Atrial fibrillation (HCC)    Depression    GERD (gastroesophageal reflux disease)    Sleep apnea    +Bipap nightly   Past Surgical History:  Procedure Laterality Date   BREAST BIOPSY Right 11/03/2020   BREAST BIOPSY Bilateral 11/2020   BREAST EXCISIONAL BIOPSY Right 12/2020   x2   BREAST LUMPECTOMY WITH RADIOACTIVE SEED LOCALIZATION Right 01/06/2021   Procedure: RIGHT BREAST LUMPECTOMY WITH RADIOACTIVE SEED LOCALIZATION X 2;  Surgeon: Harriette Bouillon, MD;  Location: [facility];  Service: General;  Laterality: Right;   ENDOMETRIAL ABLATION     KNEE ARTHROSCOPY Right    TUBAL LIGATION     Patient Active Problem List   Diagnosis Date Noted   Insomnia 10/22/2022   Depression, recurrent (HCC) 03/03/2022   Mixed hyperlipidemia 03/03/2022   Gastroesophageal reflux disease 03/03/2022    PCP: Sharlene Dory, DO   REFERRING PROVIDER: Sharlene Dory, DO  REFERRING DIAG: M54.2 (ICD-10-CM) - Neck pain   THERAPY DIAG:  Cervicalgia  Abnormal posture  Muscle weakness (generalized)  Other muscle spasm  Cervicogenic headache  RATIONALE FOR EVALUATION AND TREATMENT: Rehabilitation  ONSET DATE: August 2024  NEXT MD VISIT: None scheduled / PRN   SUBJECTIVE:                                                                                                                                                                                                          SUBJECTIVE STATEMENT: Reports daily L neck tightness but R is feeling good. Performs exercise but feels it tighten back up afterwards.  EVAL:  Pt reports intermittent neck pain L>R which radiates into her head.  She reports occasional headaches.  Occasional L>R radicular pain into shoulder/upper arm but  no numbness and tingling.  H/o L wrist fracture followed by L frozen shoulder in 2023.  She notes worsening posture which she would like to keep from progressing.  No particular limitations with any activities reported.  Hand dominance: Right  PAIN: Are you having pain? No and Yes: NPRS scale: 2/10  Pain location: L side of neck into head when present Pain description: more tightness than pain   Aggravating factors: uncertain  Relieving factors: ice, heat, ibuprofen, naproxen   PERTINENT HISTORY:  B knee OA, a-fib, depression, insomnia, GERD, multiple breast biopsies in 2022 with R breast lumpectomy and radioactive seed localization 12/2020, L wrist fracture & frozen shoulder in 2023  PRECAUTIONS: None  RED FLAGS: None  HAND DOMINANCE: Right  WEIGHT BEARING RESTRICTIONS: No  FALLS:  Has patient fallen in last 6 months? No  LIVING ENVIRONMENT: Lives with: lives with their spouse Lives in: House/apartment  OCCUPATION: Retired  PLOF: Independent and Leisure: travel, gym 5x/wk - [employer], rec bike (1 hr) & TM (30 min), shopping, photography   PATIENT GOALS: "Eliminate neck pain and improve posture - be able to do more."   OBJECTIVE: (objective measures completed at initial evaluation unless otherwise dated)  DIAGNOSTIC FINDINGS:  N/A  PATIENT SURVEYS:  NDI 8 / 50 = 16.0 %  COGNITION: Overall cognitive status: Within functional limits for tasks assessed  SENSATION: WFL  POSTURE:  rounded shoulders, forward head, and increased thoracic  kyphosis  PALPATION: Increased muscle tension and mild TTP in L>R UT, LS, rhomboids, teres group, pecs   CERVICAL ROM:   Active ROM Eval 04/28/23  Flexion 40 48  Extension 45 54  Right lateral flexion 26 stretching 35  Left lateral flexion 25 30  Right rotation 70 78  Left rotation 71 77   (Blank rows = not tested)  UPPER EXTREMITY ROM: B UE WNL  UPPER EXTREMITY MMT:  MMT Right eval Left eval R 04/28/23 L 04/28/23  Shoulder flexion 5 4+ 5 5  Shoulder extension 5 5    Shoulder abduction 5 5    Shoulder adduction      Shoulder internal rotation 5 5    Shoulder external rotation 4+ 4+ 5 5  Middle trapezius 4 4 4+ 4+  Lower trapezius 3- 3- 3+ 3+   (Blank rows = not tested)  CERVICAL SPECIAL TESTS:  Spurling's test: Negative and Distraction test: Negative   TODAY'S TREATMENT:   05/23/23 THERAPEUTIC EXERCISE: To improve strength and endurance.  Demonstration, verbal and tactile cues throughout for technique. UBE L3 x 6 min (3'fwd, 3 backward) Doorway Pec Stretch 2 x 30" Overhead stretch w/ fingers interlaced 2 x 30" Horizontal Shoulder wall walks w/ YTB 4 x 7 Standing Lower Trap Setting w/ YTB 2 x 10 Seated shoulder flexion w/ Pball or counter support x 30"  NEUROMUSCULAR RE-EDUCATION: To improve balance, coordination, kinesthesia, posture, and proprioception. Shoulder rows on Pball w/ GTB 2 x 10 Serratus Activation at wall w/ swiss ball + YTB around wrist 2 x 10 Prone scapular retraction + chin tuck T, W on Pball x 10 -  feet up against wall Prone scapular retraction + chin tuck T, W on Pball x 10 2lb - feet up against wall Prone scapular retraction + chin tuck Y on Pball 2lb 2 x 10 - knees on foam pad  05/19/23: Therapeutic activities:  UBE 6 min total, 3 F, 3 B, level 3 Instructed the pt in various stretching, therex to improve thoracic spine posture/ also  instructed in variation on strengthening of middle traps, post scapular musculature , she is to differentiate  between which exercises she thinks are the most beneficial Supine thoracic roll stretch, longitudinally, parallel to thoracic spine, also perpendicular Instructed in pec stretch, also utilizing B shoulder flexion , added 1# wt for B shoulder flex for increased stretch Inst in supine stretch with roll perpendicular , stretching B pecs with upper arm over roll , also combined with cervical ext/ flex  Supine stretch over physioball 65 cm ball  Prone for horizontal shoulder abd with thumb up mass practice Prone rows with shoulder ER mass practice   05/12/23  THERAPEUTIC EXERCISE: To improve strength and endurance.  Demonstration, verbal and tactile cues throughout for technique. UBE - L3.0 x 6 min (3' each fwd & back) Standing YTB cervical retraction isometric 10 x 5" Standing YTB cervical retraction resisted ROM 10 x 5"  NEUROMUSCULAR RE-EDUCATION: To improve coordination, kinesthesia, posture, and proprioception.  Standing B scaption and flexion w/ YTB with PT monitoring and facilitating coordinated scapular motion x 10 Standing B abduction w/ YTB with PT facilitating coordinated scapular motion x 10 Prone over green Pball + foam pad under knees - thoracic extension + scap retraction + I's, T's, Y's and W's x 10  Standing shoulder PNF D1 & D2 flex + ext x10 ea arm Wall pushup on green Pball x 10 Standing rhythmic stabilization with shoulder at ~90 flexion and hand on green Pball with PT perturbations 2 x 20" bil    05/10/23 THERAPEUTIC EXERCISE: To improve strength and endurance.  Demonstration, verbal and tactile cues throughout for technique.  UBE - L3.0 x 6 min (3' each fwd & back)  THERAPEUTIC ACTIVITIES: To improve functional performance.  Demonstration, verbal and tactile cues throughout for technique. NDI = 3 / 50 = 6.0 % Goal assessment  MANUAL THERAPY: To promote normalized muscle tension, improved flexibility, increased ROM, and reduced pain. Trigger Point Dry  Needling: Treatment instructions/education: Subsequent Treatment: Instructions provided previously at initial dry needling treatment.  Education Handout Provided: Previously Provided Consent: Patient Verbal Consent Given: Yes Treatment: Muscles Treated: B UT x 3 sticks, B subscapularis Skilled palpation and monitoring of soft tissue during DN Electrical Stimulation Performed: No Treatment Response/Outcome: Twitch response elicited, Palpable increase in muscle length, Decreased tissue resistance noted, Decreased TTP, and Improved exercise tolerance STM/DTM, manual TPR and pin & stretch to muscles addressed with DN  NEUROMUSCULAR RE-EDUCATION: To improve coordination, kinesthesia, and posture.  Lower trap setting - V wall slide with looped RTB at wrists + slight lift-off wall at terminal ROM 2 x 10 3-way serratus wall clocks with looped RTB around wrists: lateral x 10, scaption x 10, low diagonal x 10 B   PATIENT EDUCATION:  Education details: HEP update - lower trap setting and postural awareness   Person educated: Patient Education method: Explanation, Demonstration, Verbal cues, and Handouts Education comprehension: verbalized understanding, returned demonstration, verbal cues required, and needs further education  HOME EXERCISE PROGRAM:  Access Code: [email or web address] URL: [email or web address] Date: 05/19/2023 Prepared by: Amy Speaks  Exercises - Supine Cervical Retraction with Towel  - 2 x daily - 7 x weekly - 2 sets - 10 reps - 5 sec hold - Seated Cervical Retraction  - 2 x daily - 7 x weekly - 2 sets - 10 reps - 5 sec hold - Seated Thoracic Lumbar Extension with Pectoralis Stretch  - 2 x daily - 7 x weekly - 2 sets - 10 reps - 5  sec hold - Seated Gentle Upper Trapezius Stretch  - 2 x daily - 7 x weekly - 3 reps - 30 sec hold - Gentle Levator Scapulae Stretch  - 2 x daily - 7 x weekly - 3 reps - 30 sec hold - Shoulder External Rotation and Scapular Retraction with  Resistance  - 1 x daily - 3 x weekly - 2 sets - 10 reps - 3-5 sec hold - Standing Shoulder Horizontal Abduction with Resistance  - 1 x daily - 3 x weekly - 2 sets - 10 reps - 3 sec hold - Standing Shoulder Diagonal Horizontal Abduction 60/120 Degrees with Resistance  - 1 x daily - 3 x weekly - 2 sets - 10 reps - 3 sec hold - Seated Scalene Stretch with Towel  - 2 x daily - 7 x weekly - 3 reps - 30 sec hold - Mid-Lower Cervical Extension SNAG with Strap  - 1 x daily - 7 x weekly - 2 sets - 10 reps - 3 sec hold - Wall Push Up with Plus  - 1 x daily - 7 x weekly - 2 sets - 10 reps - 3 sec hold - Wall Clock with Theraband  - 1 x daily - 3 x weekly - 2 sets - 10 reps - 3 sec hold - Standing Low Trap Setting with Resistance at Wall  - 1 x daily - 3 x weekly - 2 sets - 10 reps - 3 sec hold - Cervical Retraction with Resistance  - 1 x daily - 3 x weekly - 2 sets - 10 reps - 3-5 sec hold - Supine Chest Stretch on Foam Roll  - 1 x daily - 7 x weekly - 3 sets - 10 reps - [place] on Foam Roll  - 1 x daily - 7 x weekly - 3 sets - 10 reps - Thoracic Extension Mobilization on Foam Roll  - 1 x daily - 7 x weekly - 3 sets - 10 reps  Patient Education - Trigger Point Dry Needling   ASSESSMENT:  CLINICAL IMPRESSION: Danille returns to PT stating that she feels L neck tightness daily, however she does mention that the exercises are helping.Today, we started off with some stretches to improve flexibility and reduce tightness in neck and shoulders. She is limited in overhead stretches due to L neck/shoulder tightness but was able to perform the exercises without increased pain. Continued to focus on scapular and postural strengthening that will be most appropriate for her to incorporate at home. She tolerated the exercises well and felt that she has improved 85-90% since beginning PT. She is expected to return on Thursday, at which time we anticipated she should transition to her HEP.  OBJECTIVE IMPAIRMENTS:  decreased activity tolerance, decreased knowledge of condition, decreased ROM, decreased strength, hypomobility, increased fascial restrictions, impaired perceived functional ability, increased muscle spasms, impaired flexibility, impaired UE functional use, improper body mechanics, postural dysfunction, and pain.   ACTIVITY LIMITATIONS:  reading  PARTICIPATION LIMITATIONS: driving, community activity, and recreational activities  PERSONAL FACTORS: Past/current experiences, Time since onset of injury/illness/exacerbation, and 3+ comorbidities: B knee OA, a-fib, depression, insomnia, GERD, multiple breast biopsies in 2022 with R breast lumpectomy and radioactive seed localization 12/2020, L wrist fracture & frozen shoulder in 2023  are also affecting patient's functional outcome.   REHAB POTENTIAL: Excellent  CLINICAL DECISION MAKING: Stable/uncomplicated  EVALUATION COMPLEXITY: Low   GOALS: Goals reviewed with patient? Yes  SHORT TERM GOALS: Target date: 04/28/2023  Patient will be independent with initial HEP to improve outcomes and carryover.  Baseline:  Goal status: MET - 04/19/23   2.  Patient will report 25% improvement in neck pain to improve QOL.  Baseline: 0/10 on eval, 5/10 at worst Goal status: MET - 04/19/23 - 40% improvement  LONG TERM GOALS: Target date: 05/12/2023  Patient will be independent with ongoing/advanced HEP for self-management at home.  Baseline:  Goal status: IN PROGRESS - 05/10/23 - met for current HEP, updated today  2.  Patient will demonstrate improved posture to decrease muscle imbalance. Baseline:  Goal status: IN PROGRESS - 05/10/23 - pt reports 50% improved awareness of posture and states her husband notes improvement in her posture  3.  Patient will report 50-75% improvement in neck pain to improve QOL.  Baseline: 0/10 on eval, 5/10 at worst Goal status: MET - 05/10/23 - 85-90% improvement as of 05/23/23  4.  Patient to report 50-75% reduction in  frequency and intensity of weekly headaches/migraines.   Baseline: Occasional moderate intensity headaches Goal status: MET - 05/05/23 - 95% improvement  5.  Patient will demonstrate full pain free cervical ROM for safety with driving.  Baseline: Refer to above cervical ROM table Goal status: MET - 04/28/23  6.  Patient will report </= 8% on NDI to demonstrate improved functional ability.  Baseline: 8 / 50 = 16.0 % Goal status: MET - 05/10/23 - 3 / 50 = 6.0 %  PLAN:  PT FREQUENCY: 2x/week  PT DURATION: 8 weeks  PLANNED INTERVENTIONS: 97164- PT Re-evaluation, 97110-Therapeutic exercises, 97530- Therapeutic activity, 97112- Neuromuscular re-education, 97535- Self Care, 16109- Manual therapy, 97014- Electrical stimulation (unattended), [id number]- Electrical stimulation (manual), [id number]- Ultrasound, [id number]- Traction (mechanical), [id number]- Ionotophoresis 4mg /ml Dexamethasone, Patient/Family education, Taping, Dry Needling, Joint mobilization, Spinal mobilization, Cryotherapy, and Moist heat  PLAN FOR NEXT SESSION: Final review of HEP and LTGs, anticipate to HEP + 30 day hold or discharge  Avi Archuleta Joseph-Greene, Student-PT 05/23/2023, 5:00 PM

## 2023-05-24 ENCOUNTER — Encounter

## 2023-05-26 ENCOUNTER — Ambulatory Visit: Admitting: Physical Therapy

## 2023-05-26 ENCOUNTER — Encounter: Payer: Self-pay | Admitting: Physical Therapy

## 2023-05-26 DIAGNOSIS — M542 Cervicalgia: Secondary | ICD-10-CM | POA: Diagnosis not present

## 2023-05-26 DIAGNOSIS — M6281 Muscle weakness (generalized): Secondary | ICD-10-CM | POA: Diagnosis not present

## 2023-05-26 DIAGNOSIS — G4486 Cervicogenic headache: Secondary | ICD-10-CM

## 2023-05-26 DIAGNOSIS — M62838 Other muscle spasm: Secondary | ICD-10-CM | POA: Diagnosis not present

## 2023-05-26 DIAGNOSIS — R293 Abnormal posture: Secondary | ICD-10-CM | POA: Diagnosis not present

## 2023-05-26 NOTE — Therapy (Addendum)
 OUTPATIENT PHYSICAL THERAPY TREATMENT / DISCHARGE SUMMARY   Progress Note  Reporting Period 05/10/2023 to 05/26/2023  See note below for Objective Data and Assessment of Progress/Goals.     Patient Name: Laura Shields MRN: [id number] DOB:December 22, 1955, 68 y.o., female Today's Date: 05/26/2023   END OF SESSION:  PT End of Session - 05/26/23 1530     Visit Number 14    Date for PT Re-Evaluation 05/26/23    Authorization Type [employer] & State [employer]    PT Start Time 1530    PT Stop Time 1614    PT Time Calculation (min) 44 min    Activity Tolerance Patient tolerated treatment well    Behavior During Therapy WFL for tasks assessed/performed                 Past Medical History:  Diagnosis Date   Arthritis    bil knees   Atrial fibrillation (HCC)    Depression    GERD (gastroesophageal reflux disease)    Sleep apnea    +Bipap nightly   Past Surgical History:  Procedure Laterality Date   BREAST BIOPSY Right 11/03/2020   BREAST BIOPSY Bilateral 11/2020   BREAST EXCISIONAL BIOPSY Right 12/2020   x2   BREAST LUMPECTOMY WITH RADIOACTIVE SEED LOCALIZATION Right 01/06/2021   Procedure: RIGHT BREAST LUMPECTOMY WITH RADIOACTIVE SEED LOCALIZATION X 2;  Surgeon: Vanderbilt Ned, MD;  Location: [facility];  Service: General;  Laterality: Right;   ENDOMETRIAL ABLATION     KNEE ARTHROSCOPY Right    TUBAL LIGATION     Patient Active Problem List   Diagnosis Date Noted   Insomnia 10/22/2022   Depression, recurrent (HCC) 03/03/2022   Mixed hyperlipidemia 03/03/2022   Gastroesophageal reflux disease 03/03/2022    PCP: Frann Mabel Mt, DO   REFERRING PROVIDER: Frann Mabel Mt, DO  REFERRING DIAG: M54.2 (ICD-10-CM) - Neck pain   THERAPY DIAG:  Cervicalgia  Abnormal posture  Muscle weakness (generalized)  Other muscle spasm  Cervicogenic headache  RATIONALE FOR EVALUATION AND TREATMENT: Rehabilitation  ONSET DATE: August  2024  NEXT MD VISIT: None scheduled / PRN   SUBJECTIVE:                                                                                                                                                                                                         SUBJECTIVE STATEMENT: Pt denies pain but still notes L sided neck tightness into upper shoulder.  Slight headache today but nothing like what she was getting prior.  EVAL:  Pt reports intermittent neck pain L>R which radiates into her head.  She reports occasional headaches.  Occasional L>R radicular pain into shoulder/upper arm but no numbness and tingling.  H/o L wrist fracture followed by L frozen shoulder in 2023.  She notes worsening posture which she would like to keep from progressing.  No particular limitations with any activities reported.  Hand dominance: Right  PAIN: Are you having pain? No and Yes: NPRS scale: 0/10  Pain location: L side of neck into head when present Pain description: more tightness than pain   Aggravating factors: uncertain  Relieving factors: ice, heat, ibuprofen, naproxen   PERTINENT HISTORY:  B knee OA, a-fib, depression, insomnia, GERD, multiple breast biopsies in 2022 with R breast lumpectomy and radioactive seed localization 12/2020, L wrist fracture & frozen shoulder in 2023  PRECAUTIONS: None  RED FLAGS: None  HAND DOMINANCE: Right  WEIGHT BEARING RESTRICTIONS: No  FALLS:  Has patient fallen in last 6 months? No  LIVING ENVIRONMENT: Lives with: lives with their spouse Lives in: House/apartment  OCCUPATION: Retired  PLOF: Independent and Leisure: travel, gym 5x/wk - [employer], rec bike (1 hr) & TM (30 min), shopping, photography   PATIENT GOALS: Eliminate neck pain and improve posture - be able to do more.   OBJECTIVE: (objective measures completed at initial evaluation unless otherwise dated)  DIAGNOSTIC FINDINGS:  N/A  PATIENT SURVEYS:  NDI 8 / 50 = 16.0  %  COGNITION: Overall cognitive status: Within functional limits for tasks assessed  SENSATION: WFL  POSTURE:  rounded shoulders, forward head, and increased thoracic kyphosis  PALPATION: Increased muscle tension and mild TTP in L>R UT, LS, rhomboids, teres group, pecs   CERVICAL ROM:   Active ROM Eval 04/28/23 05/26/23  Flexion 40 48 50  Extension 45 54 59  Right lateral flexion 26 stretching 35 39  Left lateral flexion 25 30 38  Right rotation 70 78 81  Left rotation 71 77 80   (Blank rows = not tested)  UPPER EXTREMITY ROM: B UE WNL  UPPER EXTREMITY MMT:  MMT Right eval Left eval R 04/28/23 L 04/28/23 R 05/26/23 L 05/26/23  Shoulder flexion 5 4+ 5 5 5 5   Shoulder extension 5 5   5 5   Shoulder abduction 5 5   5 5   Shoulder adduction        Shoulder internal rotation 5 5   5 5   Shoulder external rotation 4+ 4+ 5 5 5 5   Middle trapezius 4 4 4+ 4+ 5 5  Lower trapezius 3- 3- 3+ 3+ 4- 4-   (Blank rows = not tested)  CERVICAL SPECIAL TESTS:  Spurling's test: Negative and Distraction test: Negative   TODAY'S TREATMENT:   05/26/23  THERAPEUTIC EXERCISE: To improve strength and endurance.  Demonstration, verbal and tactile cues throughout for technique. UBE - L3.0 x 6 min (3' each fwd & back) Review and consolidation of HEP  THERAPEUTIC ACTIVITIES: To improve functional performance.  Demonstration, verbal and tactile cues throughout for technique. Cervical ROM assessment UE MMT Goal assessment  MANUAL THERAPY: To promote normalized muscle tension, improved flexibility, improved joint mobility, and reduced pain. Trigger Point Dry Needling: Treatment instructions/education: Subsequent Treatment: Instructions provided previously at initial dry needling treatment.  Education Handout Provided: Previously Provided Consent: Patient Verbal Consent Given: Yes Treatment: Muscles Treated: L UT x 2, splenius cervicis, lateral and posterior deltoid Skilled palpation and  monitoring of soft tissue during DN Electrical Stimulation Performed: No Treatment Response/Outcome: Twitch  response elicited, Palpable increase in muscle length, Decreased tissue resistance noted, and Decreased TTP STM/DTM, manual TPR and pin & stretch to muscles addressed with DN   05/23/23 THERAPEUTIC EXERCISE: To improve strength and endurance.  Demonstration, verbal and tactile cues throughout for technique. UBE L3 x 6 min (3'fwd, 3 backward) Doorway Pec Stretch 2 x 30 Overhead stretch w/ fingers interlaced 2 x 30 Horizontal Shoulder wall walks w/ YTB 4 x 7 Standing Lower Trap Setting w/ YTB 2 x 10 Seated shoulder flexion w/ Pball or counter support x 30  NEUROMUSCULAR RE-EDUCATION: To improve balance, coordination, kinesthesia, posture, and proprioception. Shoulder rows on Pball w/ GTB 2 x 10 Serratus Activation at wall w/ swiss ball + YTB around wrist 2 x 10 Prone scapular retraction + chin tuck T, W on Pball x 10 -  feet up against wall Prone scapular retraction + chin tuck T, W on Pball x 10 2lb - feet up against wall Prone scapular retraction + chin tuck Y on Pball 2lb 2 x 10 - knees on foam pad   05/19/23: Therapeutic activities:  UBE 6 min total, 3 F, 3 B, level 3 Instructed the pt in various stretching, therex to improve thoracic spine posture/ also instructed in variation on strengthening of middle traps, post scapular musculature , she is to differentiate between which exercises she thinks are the most beneficial Supine thoracic roll stretch, longitudinally, parallel to thoracic spine, also perpendicular Instructed in pec stretch, also utilizing B shoulder flexion , added 1# wt for B shoulder flex for increased stretch Inst in supine stretch with roll perpendicular , stretching B pecs with upper arm over roll , also combined with cervical ext/ flex  Supine stretch over physioball 65 cm ball  Prone for horizontal shoulder abd with thumb up mass practice Prone rows with  shoulder ER mass practice   PATIENT EDUCATION:  Education details: HEP review and recommended frequency for ongoing HEP at discharge to prevent loss of gains achieved with PT   Person educated: Patient Education method: Programmer, multimedia, Demonstration, Verbal cues, and Handouts Education comprehension: verbalized understanding, returned demonstration, verbal cues required, and needs further education  HOME EXERCISE PROGRAM:  Access Code: [email or web address] URL: [email or web address] Date: 05/26/2023 Prepared by: Elijah Hidden  Exercises - Seated Thoracic Lumbar Extension with Pectoralis Stretch  - 2 x daily - 7 x weekly - 2 sets - 10 reps - 5 sec hold - Thoracic Extension Mobilization on Foam Roll  - 1 x daily - 7 x weekly - 3 sets - 10 reps - Supine Chest Stretch on Foam Roll  - 1 x daily - 7 x weekly - 3 sets - 10 reps - Seated Gentle Upper Trapezius Stretch  - 2 x daily - 7 x weekly - 3 reps - 30 sec hold - [place] on Foam Roll  - 1 x daily - 7 x weekly - 3 sets - 10 reps - Gentle Levator Scapulae Stretch  - 2 x daily - 7 x weekly - 3 reps - 30 sec hold - Shoulder External Rotation and Scapular Retraction with Resistance  - 1 x daily - 3 x weekly - 2 sets - 10 reps - 3-5 sec hold - Standing Shoulder Horizontal Abduction with Resistance  - 1 x daily - 3 x weekly - 2 sets - 10 reps - 3 sec hold - Standing Shoulder Diagonal Horizontal Abduction 60/120 Degrees with Resistance  - 1 x daily - 3 x weekly - 2 sets - 10  reps - 3 sec hold - Wall Push Up with Plus  - 1 x daily - 3 x weekly - 2 sets - 10 reps - 3 sec hold - Wall Clock with Theraband  - 1 x daily - 3 x weekly - 2 sets - 10 reps - 3 sec hold - Standing Low Trap Setting with Resistance at Wall  - 1 x daily - 3 x weekly - 2 sets - 10 reps - 3 sec hold - Cervical Retraction with Resistance  - 1 x daily - 3 x weekly - 2 sets - 10 reps - 3-5 sec hold - Prone Shoulder Horizontal Abduction  - 1 x daily - 3 x weekly - 3 sets - 10 reps -  Prone Shoulder Row with External Rotation with Dumbbell  - 1 x daily - 3 x weekly - 3 sets - 10 reps  Patient Education - Trigger Point Dry Needling   ASSESSMENT:  CLINICAL IMPRESSION: Dennis denies recent neck pain but still notes L-sided tightness extending into L shoulder.  She does note a mild headache today but does not feel that it is the same type of headaches she was having when she started PT, rather more likely due to the weather or pollen.  Tightness addressed with MT incorporating TPDN with good twitch responses elicited resulting in palpable reduction in muscle tension.  HEP reviewed and consolidated with guidance provided on recommendations for ongoing frequency of HEP performance.  All PT goals now met and Alyissa feels ready to transition to her HEP but would like to remain on hold for 30-days in the event that issues arise that would necessitate a return to PT.   OBJECTIVE IMPAIRMENTS: decreased activity tolerance, decreased knowledge of condition, decreased ROM, decreased strength, hypomobility, increased fascial restrictions, impaired perceived functional ability, increased muscle spasms, impaired flexibility, impaired UE functional use, improper body mechanics, postural dysfunction, and pain.   ACTIVITY LIMITATIONS: reading  PARTICIPATION LIMITATIONS: driving, community activity, and recreational activities  PERSONAL FACTORS: Past/current experiences, Time since onset of injury/illness/exacerbation, and 3+ comorbidities: B knee OA, a-fib, depression, insomnia, GERD, multiple breast biopsies in 2022 with R breast lumpectomy and radioactive seed localization 12/2020, L wrist fracture & frozen shoulder in 2023 are also affecting patient's functional outcome.   REHAB POTENTIAL: Excellent  CLINICAL DECISION MAKING: Stable/uncomplicated  EVALUATION COMPLEXITY: Low   GOALS: Goals reviewed with patient? Yes  SHORT TERM GOALS: Target date: 04/28/2023  Patient will be independent  with initial HEP to improve outcomes and carryover.  Baseline:  Goal status: MET - 04/19/23   2.  Patient will report 25% improvement in neck pain to improve QOL.  Baseline: 0/10 on eval, 5/10 at worst Goal status: MET - 04/19/23 - 40% improvement  LONG TERM GOALS: Target date: 05/12/2023  Patient will be independent with ongoing/advanced HEP for self-management at home.  Baseline:  Goal status: MET - 05/26/23   2.  Patient will demonstrate improved posture to decrease muscle imbalance. Baseline:  Goal status: MET - 05/26/23 - pt reports 90% improved awareness of posture and states her husband notes improvement in her posture  3.  Patient will report 50-75% improvement in neck pain to improve QOL.  Baseline: 0/10 on eval, 5/10 at worst Goal status: MET - 05/23/23 - 85-90% improvement   4.  Patient to report 50-75% reduction in frequency and intensity of weekly headaches/migraines.   Baseline: Occasional moderate intensity headaches Goal status: MET - 05/05/23 - 95% improvement  5.  Patient will  demonstrate full pain free cervical ROM for safety with driving.  Baseline: Refer to above cervical ROM table Goal status: MET - 04/28/23  6.  Patient will report </= 8% on NDI to demonstrate improved functional ability.  Baseline: 8 / 50 = 16.0 % Goal status: MET - 05/10/23 - 3 / 50 = 6.0 %  PLAN:  PT FREQUENCY: 2x/week  PT DURATION: 8 weeks  PLANNED INTERVENTIONS: 97164- PT Re-evaluation, 97110-Therapeutic exercises, 97530- Therapeutic activity, 97112- Neuromuscular re-education, 97535- Self Care, 02859- Manual therapy, 97014- Electrical stimulation (unattended), [id number]- Electrical stimulation (manual), [id number]- Ultrasound, 02987- Traction (mechanical), [id number]- Ionotophoresis 4mg /ml Dexamethasone , Patient/Family education, Taping, Dry Needling, Joint mobilization, Spinal mobilization, Cryotherapy, and Moist heat  PLAN FOR NEXT SESSION: transition to HEP + 30 day hold    Elijah CHRISTELLA Hidden,  PT 05/26/2023, 4:59 PM   PHYSICAL THERAPY DISCHARGE SUMMARY  Visits from Start of Care: 14  Current functional level related to goals / functional outcomes: Refer to above clinical impression and goal assessment for status as of last visit on 05/26/2023. Patient was placed on hold for 30 days and has not needed to return to PT, therefore will proceed with discharge from PT for this episode.     Remaining deficits: As above.   Education / Equipment: HEP   Patient agrees to discharge. Patient goals were met. Patient is being discharged due to meeting the stated rehab goals.  Elijah EMERSON Hidden, PT 09/20/2023, 10:37 AM  [facility] [address]  [address] [place], [place], [address] Phone: [phone]   Fax:  [phone]

## 2023-05-27 ENCOUNTER — Encounter: Payer: Self-pay | Admitting: Family Medicine

## 2023-05-27 ENCOUNTER — Ambulatory Visit (INDEPENDENT_AMBULATORY_CARE_PROVIDER_SITE_OTHER): Admitting: Family Medicine

## 2023-05-27 ENCOUNTER — Other Ambulatory Visit (HOSPITAL_COMMUNITY): Payer: Self-pay

## 2023-05-27 ENCOUNTER — Telehealth: Payer: Self-pay | Admitting: Pharmacy Technician

## 2023-05-27 VITALS — BP 134/76 | HR 77 | Temp 98.1°F | Resp 18 | Ht 66.0 in | Wt 163.4 lb

## 2023-05-27 DIAGNOSIS — E663 Overweight: Secondary | ICD-10-CM

## 2023-05-27 MED ORDER — PHENTERMINE HCL 15 MG PO CAPS: 15.0000 mg | ORAL_CAPSULE | ORAL | 0 refills | Status: DC

## 2023-05-27 NOTE — Progress Notes (Signed)
 No chief complaint on file.   Subjective: Patient is a 68 y.o. female here for discussion of weight.  Pt is interested in losing 30 lbs. She exercises 3-5 times per week. Diet is fair. 1400 cal/d. Unsure how many calories are delegated to protein. Perhaps 40% to simple carbs. She did Weight Watchers years ago that worked well. Has never been on a medication before.   Past Medical History:  Diagnosis Date   Arthritis    bil knees   Atrial fibrillation (HCC)    Depression    GERD (gastroesophageal reflux disease)    Sleep apnea    +Bipap nightly    Objective: BP 134/76   Pulse 77   Temp 98.1 F (36.7 C)   Resp 18   Ht 5\' 6"  (1.676 m)   Wt 163 lb 6.4 oz (74.1 kg)   SpO2 100%   BMI 26.37 kg/m  General: Awake, appears stated age Heart: RRR Lungs: CTAB, no rales, wheezes or rhonchi. No accessory muscle use Psych: Age appropriate judgment and insight, normal affect and mood  Assessment and Plan: Overweight - Plan: phentermine 15 MG capsule  Chronic, not controlled. Not in A fib. Will start low dose of phentermine 15 mg/d. Counseled on diet/exercise. High protein intake encouraged. Reduce simple carb intake. Consider body part splits for strength training. Contact ins to see if they cover any weekly injectables. F/u in 1 mo.  The patient voiced understanding and agreement to the plan.  Jilda Roche [place], DO 05/27/23  8:30 AM

## 2023-05-27 NOTE — Patient Instructions (Signed)
 Call your insurance to see if they cover any weight loss injectables.   Keep the diet clean and stay active.  Try to reduce simple carbs to 20% or less of your total caloric intake.  Try to get around 80-100 g of protein daily (30-35% of total intake).   Let us know if you need anything.  Healthy Eating Plan Many factors influence your heart health, including eating and exercise habits. Heart (coronary) risk increases with abnormal blood fat (lipid) levels. Heart-healthy meal planning includes limiting unhealthy fats, increasing healthy fats, and making other small dietary changes. This includes maintaining a healthy body weight to help keep lipid levels within a normal range.  WHAT IS MY PLAN?  Your health care provider recommends that you: Drink a glass of water before meals to help with satiety. Eat slowly. An alternative to the water is to add Metamucil. This will help with satiety as well. It does contain calories, unlike water.  WHAT TYPES OF FAT SHOULD I CHOOSE? Choose healthy fats more often. Choose monounsaturated and polyunsaturated fats, such as olive oil and canola oil, flaxseeds, walnuts, almonds, and seeds. Eat more omega-3 fats. Good choices include salmon, mackerel, sardines, tuna, flaxseed oil, and ground flaxseeds. Aim to eat fish at least two times each week. Avoid foods with partially hydrogenated oils in them. These contain trans fats. Examples of foods that contain trans fats are stick margarine, some tub margarines, cookies, crackers, and other baked goods. If you are going to avoid a fat, this is the one to avoid!  WHAT GENERAL GUIDELINES DO I NEED TO FOLLOW? Check food labels carefully to identify foods with trans fats. Avoid these types of options when possible. Fill one half of your plate with vegetables and green salads. Eat 4-5 servings of vegetables per day. A serving of vegetables equals 1 cup of raw leafy vegetables,  cup of raw or cooked cut-up vegetables,  or  cup of vegetable juice. Fill one fourth of your plate with whole grains. Look for the word "whole" as the first word in the ingredient list. Fill one fourth of your plate with lean protein foods. Eat 4-5 servings of fruit per day. A serving of fruit equals one medium whole fruit,  cup of dried fruit,  cup of fresh, frozen, or canned fruit. Try to avoid fruits in cups/syrups as the sugar content can be high. Eat more foods that contain soluble fiber. Examples of foods that contain this type of fiber are apples, broccoli, carrots, beans, peas, and barley. Aim to get 20-30 g of fiber per day. Eat more home-cooked food and less restaurant, buffet, and fast food. Limit or avoid alcohol. Limit foods that are high in starch and sugar. Avoid fried foods when able. Cook foods by using methods other than frying. Baking, boiling, grilling, and broiling are all great options. Other fat-reducing suggestions include: Removing the skin from poultry. Removing all visible fats from meats. Skimming the fat off of stews, soups, and gravies before serving them. Steaming vegetables in water or broth. Lose weight if you are overweight. Losing just 5-10% of your initial body weight can help your overall health and prevent diseases such as diabetes and heart disease. Increase your consumption of nuts, legumes, and seeds to 4-5 servings per week. One serving of dried beans or legumes equals  cup after being cooked, one serving of nuts equals 1 ounces, and one serving of seeds equals  ounce or 1 tablespoon.  WHAT ARE GOOD FOODS CAN I  EAT? Grains Grainy breads (try to find bread that is 3 g of fiber per slice or greater), oatmeal, light popcorn. Whole-grain cereals. Rice and pasta, including brown rice and those that are made with whole wheat. Edamame pasta is a great alternative to grain pasta. It has a higher protein content. Try to avoid significant consumption of white bread, sugary cereals, or pastries/baked  goods.  Vegetables All vegetables. Cooked white potatoes do not count as vegetables.  Fruits All fruits, but limit pineapple and bananas as these fruits have a higher sugar content.  Meats and Other Protein Sources Lean, well-trimmed beef, veal, pork, and lamb. Chicken and [place] without skin. All fish and shellfish. Wild duck, rabbit, pheasant, and venison. Egg whites or low-cholesterol egg substitutes. Dried beans, peas, lentils, and tofu. Seeds and most nuts.  Dairy Low-fat or nonfat cheeses, including ricotta, string, and mozzarella. Skim or 1% milk that is liquid, powdered, or evaporated. Buttermilk that is made with low-fat milk. Nonfat or low-fat yogurt. Soy/Almond milk are good alternatives if you cannot handle dairy.  Beverages Water is the best for you. Sports drinks with less sugar are more desirable unless you are a highly active athlete.  Sweets and Desserts Sherbets and fruit ices. Honey, jam, marmalade, jelly, and syrups. Dark chocolate.  Eat all sweets and desserts in moderation.  Fats and Oils Nonhydrogenated (trans-free) margarines. Vegetable oils, including soybean, sesame, sunflower, olive, peanut, safflower, corn, canola, and cottonseed. Salad dressings or mayonnaise that are made with a vegetable oil. Limit added fats and oils that you use for cooking, baking, salads, and as spreads.  Other Cocoa powder. Coffee and tea. Most condiments.  The items listed above may not be a complete list of recommended foods or beverages. Contact your dietitian for more options.

## 2023-05-27 NOTE — Telephone Encounter (Signed)
 Pharmacy Patient Advocate Encounter   Received notification from CoverMyMeds that prior authorization for Phentermine HCl 15MG  capsules  is required/requested.   Insurance verification completed.   The patient is insured through [employer] [facility] .   Per test claim:     THE DISCOUNTED CASH PRICE IS $16.63 WITH A PRESCRIPTION AT ANY OF OUR [place] [facility]

## 2023-06-16 DIAGNOSIS — R87810 Cervical high risk human papillomavirus (HPV) DNA test positive: Secondary | ICD-10-CM | POA: Diagnosis not present

## 2023-06-27 ENCOUNTER — Other Ambulatory Visit (HOSPITAL_COMMUNITY): Payer: Self-pay

## 2023-06-27 ENCOUNTER — Ambulatory Visit (INDEPENDENT_AMBULATORY_CARE_PROVIDER_SITE_OTHER): Admitting: Family Medicine

## 2023-06-27 ENCOUNTER — Encounter: Payer: Self-pay | Admitting: Family Medicine

## 2023-06-27 VITALS — BP 116/78 | HR 86 | Temp 98.0°F | Resp 16 | Ht 66.0 in | Wt 156.0 lb

## 2023-06-27 DIAGNOSIS — E663 Overweight: Secondary | ICD-10-CM

## 2023-06-27 MED ORDER — PHENTERMINE HCL 30 MG PO CAPS: 30.0000 mg | ORAL_CAPSULE | ORAL | 0 refills | Status: DC

## 2023-06-27 NOTE — Progress Notes (Signed)
 Chief Complaint  Patient presents with   Follow-up    Follow Up weight     Subjective: Patient is a 68 y.o. female here for weight f/u.  Patient was started on phentermine  15 mg daily.  She reports compliance and mostly no adverse effects.  She did have some dry mouth.  She felt cravings and appetite both decreased.  She has lost around 7 pounds.  She is getting some walking and cycling in the but no strength training.  Diet is better overall.  She would like to go up on the dosage.  Past Medical History:  Diagnosis Date   Arthritis    bil knees   Atrial fibrillation (HCC)    Depression    GERD (gastroesophageal reflux disease)    Sleep apnea    +Bipap nightly    Objective: BP 116/78 (BP Location: Left Arm, Patient Position: Sitting)   Pulse 86   Temp 98 F (36.7 C) (Oral)   Resp 16   Ht 5\' 6"  (1.676 m)   Wt 156 lb (70.8 kg)   SpO2 96%   BMI 25.18 kg/m  General: Awake, appears stated age Heart: RRR, no LE edema Lungs: CTAB, no rales, wheezes or rhonchi. No accessory muscle use Psych: Age appropriate judgment and insight, normal affect and mood  Assessment and Plan: Overweight (BMI 25.0-29.9)  Chronic, improving.  Increase phentermine  from 15 mg daily to 30 mg daily for the next 30 days.  She will send me a message in the next 3-1/2 weeks and update me if she wants to increase, leave alone, or decrease her dosage for the final 30 days of treatment.  Counseled on diet and exercise.  I did recommend more routine strength training.  Unless she needs this, I will see her as originally scheduled. The patient voiced understanding and agreement to the plan.  Shellie Dials [place], DO 06/27/23  10:22 AM

## 2023-06-27 NOTE — Patient Instructions (Signed)
 Send me a message in 25ish days letting me know if you want to adjust the dosage up, down, or leave it alone.   Keep the diet clean and stay active.  Please consider adding some weight resistance exercise to your routine. Consider yoga as well.   Let us  know if you need anything.

## 2023-06-30 DIAGNOSIS — D225 Melanocytic nevi of trunk: Secondary | ICD-10-CM | POA: Diagnosis not present

## 2023-06-30 DIAGNOSIS — L578 Other skin changes due to chronic exposure to nonionizing radiation: Secondary | ICD-10-CM | POA: Diagnosis not present

## 2023-06-30 DIAGNOSIS — L821 Other seborrheic keratosis: Secondary | ICD-10-CM | POA: Diagnosis not present

## 2023-06-30 DIAGNOSIS — D2362 Other benign neoplasm of skin of left upper limb, including shoulder: Secondary | ICD-10-CM | POA: Diagnosis not present

## 2023-07-12 ENCOUNTER — Encounter: Payer: Self-pay | Admitting: Family Medicine

## 2023-07-17 ENCOUNTER — Encounter: Payer: Self-pay | Admitting: Family Medicine

## 2023-07-18 ENCOUNTER — Other Ambulatory Visit: Payer: Self-pay | Admitting: Family Medicine

## 2023-07-18 ENCOUNTER — Other Ambulatory Visit (HOSPITAL_COMMUNITY): Payer: Self-pay

## 2023-07-18 ENCOUNTER — Telehealth: Payer: Self-pay | Admitting: Pharmacist

## 2023-07-18 DIAGNOSIS — I4892 Unspecified atrial flutter: Secondary | ICD-10-CM | POA: Diagnosis not present

## 2023-07-18 DIAGNOSIS — I440 Atrioventricular block, first degree: Secondary | ICD-10-CM | POA: Diagnosis not present

## 2023-07-18 DIAGNOSIS — I459 Conduction disorder, unspecified: Secondary | ICD-10-CM | POA: Diagnosis not present

## 2023-07-18 MED ORDER — PHENTERMINE HCL 37.5 MG PO CAPS: 37.5000 mg | ORAL_CAPSULE | ORAL | 0 refills | Status: DC

## 2023-07-18 NOTE — Telephone Encounter (Signed)
 Pharmacy Patient Advocate Encounter   Received notification from Patient Pharmacy that prior authorization for Phentermine  HCl 37.5MG  is required/requested.   Insurance verification completed.   The patient is insured through [facility] .   Per test claim:

## 2023-07-18 NOTE — Telephone Encounter (Signed)
 Message sent to pt letting her know medication not covered.

## 2023-07-27 ENCOUNTER — Other Ambulatory Visit: Payer: Self-pay | Admitting: Surgery

## 2023-07-27 DIAGNOSIS — Z9189 Other specified personal risk factors, not elsewhere classified: Secondary | ICD-10-CM

## 2023-08-10 ENCOUNTER — Ambulatory Visit
Admission: RE | Admit: 2023-08-10 | Discharge: 2023-08-10 | Disposition: A | Discharge status: Home / Self Care | Source: Ambulatory Visit | Attending: Surgery | Admitting: Surgery

## 2023-08-10 ENCOUNTER — Encounter: Payer: Self-pay | Admitting: Family Medicine

## 2023-08-10 DIAGNOSIS — Z1239 Encounter for other screening for malignant neoplasm of breast: Secondary | ICD-10-CM | POA: Diagnosis not present

## 2023-08-10 DIAGNOSIS — Z9189 Other specified personal risk factors, not elsewhere classified: Secondary | ICD-10-CM

## 2023-08-10 DIAGNOSIS — Z803 Family history of malignant neoplasm of breast: Secondary | ICD-10-CM | POA: Diagnosis not present

## 2023-08-10 MED ORDER — GADOPICLENOL 0.5 MMOL/ML IV SOLN
7.0000 mL | Freq: Once | INTRAVENOUS | Status: AC | PRN
  Administered 2023-08-10: 7 mL via INTRAVENOUS

## 2023-08-18 DIAGNOSIS — G4733 Obstructive sleep apnea (adult) (pediatric): Secondary | ICD-10-CM | POA: Diagnosis not present

## 2023-09-01 ENCOUNTER — Encounter: Payer: Self-pay | Admitting: Family Medicine

## 2023-09-01 ENCOUNTER — Other Ambulatory Visit: Payer: Self-pay | Admitting: Family Medicine

## 2023-09-05 ENCOUNTER — Ambulatory Visit: Payer: Medicare Other | Admitting: Family Medicine

## 2023-09-13 ENCOUNTER — Ambulatory Visit (INDEPENDENT_AMBULATORY_CARE_PROVIDER_SITE_OTHER): Admitting: Family Medicine

## 2023-09-13 ENCOUNTER — Ambulatory Visit: Payer: Self-pay | Admitting: Family Medicine

## 2023-09-13 ENCOUNTER — Encounter: Payer: Self-pay | Admitting: Family Medicine

## 2023-09-13 VITALS — BP 128/78 | HR 77 | Temp 97.7°F | Resp 16 | Ht 66.0 in | Wt 143.0 lb

## 2023-09-13 DIAGNOSIS — R7303 Prediabetes: Secondary | ICD-10-CM | POA: Insufficient documentation

## 2023-09-13 DIAGNOSIS — F339 Major depressive disorder, recurrent, unspecified: Secondary | ICD-10-CM | POA: Diagnosis not present

## 2023-09-13 DIAGNOSIS — E782 Mixed hyperlipidemia: Secondary | ICD-10-CM | POA: Diagnosis not present

## 2023-09-13 LAB — LIPID PANEL
Cholesterol: 143 mg/dL (ref 0–200)
HDL: 52.8 mg/dL (ref >39.00)
LDL Cholesterol: 69 mg/dL (ref 0–99)
NonHDL: 90.29
Total CHOL/HDL Ratio: 3
Triglycerides: 106 mg/dL (ref 0.0–149.0)
VLDL: 21.2 mg/dL (ref 0.0–40.0)

## 2023-09-13 LAB — COMPREHENSIVE METABOLIC PANEL WITH GFR
ALT: 14 U/L (ref 0–35)
AST: 19 U/L (ref 0–37)
Albumin: 4.9 g/dL (ref 3.5–5.2)
Alkaline Phosphatase: 79 U/L (ref 39–117)
BUN: 18 mg/dL (ref 6–23)
CO2: 24 meq/L (ref 19–32)
Calcium: 10.3 mg/dL (ref 8.4–10.5)
Chloride: 104 meq/L (ref 96–112)
Creatinine, Ser: 1.04 mg/dL (ref 0.40–1.20)
GFR: 56.13 mL/min — ABNORMAL LOW (ref >60.00)
Glucose, Bld: 88 mg/dL (ref 70–99)
Potassium: 4.3 meq/L (ref 3.5–5.1)
Sodium: 139 meq/L (ref 135–145)
Total Bilirubin: 0.6 mg/dL (ref 0.2–1.2)
Total Protein: 7.4 g/dL (ref 6.0–8.3)

## 2023-09-13 LAB — HEMOGLOBIN A1C: Hgb A1c MFr Bld: 6.1 % (ref 4.6–6.5)

## 2023-09-13 NOTE — Progress Notes (Signed)
 Chief Complaint  Patient presents with   Follow-up    Follow Up    Subjective: Hyperlipidemia Patient presents for Hyperlipidemia follow up. Currently taking Crestor  40 mg/d and compliance with treatment thus far has been good. She denies myalgias. She is adhering to a healthy diet. Exercise: cycling, walking, elliptical, strength training No CP, some SOB related to CPAP.  The patient is not known to have coexisting coronary artery disease.  Depression Taking Wellbutrin   XL 150 mg/d and trazodone  25-50 mg qhs prn. She is not seeing a therapist. No SI or HI.  No self-medication.  Past Medical History:  Diagnosis Date   Arthritis    bil knees   Atrial fibrillation (HCC)    Depression    GERD (gastroesophageal reflux disease)    Sleep apnea    +Bipap nightly    Objective: BP 128/78 (BP Location: Left Arm, Patient Position: Sitting)   Pulse 77   Temp 97.7 F (36.5 C) (Oral)   Resp 16   Ht 5' 6 (1.676 m)   Wt 143 lb (64.9 kg)   SpO2 98%   BMI 23.08 kg/m  General: Awake, appears stated age Heart: RRR, no LE edema, no bruits Lungs: CTAB, no rales, wheezes or rhonchi. No accessory muscle use Psych: Age appropriate judgment and insight, normal affect and mood  Assessment and Plan: Mixed hyperlipidemia - Plan: Comprehensive metabolic panel with GFR, Lipid panel  Depression, recurrent (HCC)  Prediabetes - Plan: Hemoglobin A1c  Chronic, stable.  Continue Crestor  40 mg daily.  Check above labs.  Counseled on diet and exercise. Chronic, stable.  Continue Wellbutrin  XL 150 mg daily, trazodone  25 to 50 mg nightly as needed. Check above. Follow-up in 6 months for physical or as needed. The patient voiced understanding and agreement to the plan.  Mabel Mt [place], DO 09/13/23  9:01 AM

## 2023-09-13 NOTE — Patient Instructions (Signed)
 We will be in touch with your labs.   Stay active and hydrated.  Take Metamucil or Benefiber daily.  Try 2 tablespoons of milk of mag in 4 oz of warm prune juice. Do that and wait a couple hours. If no improvement, try a Dulcolax suppository and then let me know if we are still having issues.   Let us  know if you need anything.

## 2023-09-19 DIAGNOSIS — K080 Exfoliation of teeth due to systemic causes: Secondary | ICD-10-CM | POA: Diagnosis not present

## 2023-10-26 ENCOUNTER — Encounter: Payer: Self-pay | Admitting: Family Medicine

## 2023-11-02 ENCOUNTER — Ambulatory Visit (INDEPENDENT_AMBULATORY_CARE_PROVIDER_SITE_OTHER): Admitting: *Deleted

## 2023-11-02 VITALS — Ht 66.0 in | Wt 140.0 lb

## 2023-11-02 DIAGNOSIS — Z0000 Encounter for general adult medical examination without abnormal findings: Secondary | ICD-10-CM | POA: Diagnosis not present

## 2023-11-02 NOTE — Progress Notes (Signed)
 Subjective:   Laura Shields is a 68 y.o. who presents for a Medicare Wellness preventive visit.  As a reminder, Annual Wellness Visits don't include a physical exam, and some assessments may be limited, especially if this visit is performed virtually. We may recommend an in-person follow-up visit with your provider if needed.  Visit Complete: Virtual I connected with  Laura Shields on 11/02/23 by a audio enabled telemedicine application and verified that I am speaking with the correct person using two identifiers.  Patient Location: Home  Provider Location: Office/Clinic  I discussed the limitations of evaluation and management by telemedicine. The patient expressed understanding and agreed to proceed.  Vital Signs: Because this visit was a virtual/telehealth visit, some criteria may be missing or patient reported. Any vitals not documented were not able to be obtained and vitals that have been documented are patient reported.  VideoDeclined- This patient declined Librarian, academic. Therefore the visit was completed with audio only.  Persons Participating in Visit: Patient.  AWV Questionnaire: Yes: Patient Medicare AWV questionnaire was completed by the patient on 10/27/23; I have confirmed that all information answered by patient is correct and no changes since this date.  Cardiac Risk Factors include: advanced age (>32men, >53 women);dyslipidemia     Objective:    Today's Vitals   11/02/23 1301  Weight: 140 lb (63.5 kg)  Height: 5' 6 (1.676 m)   Body mass index is 22.6 kg/m.     11/02/2023    1:05 PM 03/31/2023    8:05 AM 01/06/2021    6:24 AM 12/30/2020    1:48 PM  Advanced Directives  Does Patient Have a Medical Advance Directive? Yes Yes Yes Yes  Type of Estate agent of [place];Living will Healthcare Power of [place];Living will Healthcare Power of [employer] of [place];Living will  Does patient  want to make changes to medical advance directive? No - Patient declined No - Patient declined No - Patient declined No - Patient declined  Copy of Healthcare Power of Attorney in Chart? No - copy requested No - copy requested No - copy requested     Current Medications (verified) Outpatient Encounter Medications as of 11/02/2023  Medication Sig   azelastine (ASTELIN) 0.1 % nasal spray Place into both nostrils 2 (two) times daily. Use in each nostril as directed   buPROPion  (WELLBUTRIN  XL) 150 MG 24 hr tablet Take 1 tablet (150 mg total) by mouth daily.   calcium  carbonate (OSCAL) 1500 (600 Ca) MG TABS tablet Take 600 mg of elemental calcium  by mouth 2 (two) times daily with a meal.   cholecalciferol (VITAMIN D3) 25 MCG (1000 UNIT) tablet Take 2,000 Units by mouth daily.   flecainide (TAMBOCOR) 100 MG tablet Take 100 mg by mouth 2 (two) times daily.   glucosamine-chondroitin 500-400 MG tablet Take 1 tablet by mouth 3 (three) times daily. (Patient taking differently: Take 1 tablet by mouth daily.)   rosuvastatin  (CRESTOR ) 40 MG tablet Take 1 tablet (40 mg total) by mouth daily.   traZODone  (DESYREL ) 50 MG tablet TAKE 0.5-1 TABLETS BY MOUTH AT BEDTIME AS NEEDED FOR SLEEP.   tretinoin (RETIN-A) 0.025 % cream Apply 0.05 % topically at bedtime.   triamcinolone cream (KENALOG) 0.1 % Apply 1 Application topically 2 (two) times daily.   No facility-administered encounter medications on file as of 11/02/2023.    Allergies (verified) Patient has no known allergies.   History: Past Medical History:  Diagnosis Date  Arthritis    bil knees   Atrial fibrillation (HCC)    Depression    GERD (gastroesophageal reflux disease)    Sleep apnea    +Bipap nightly   Past Surgical History:  Procedure Laterality Date   BREAST BIOPSY Right 11/03/2020   BREAST BIOPSY Bilateral 11/2020   BREAST EXCISIONAL BIOPSY Right 12/2020   x2   BREAST LUMPECTOMY WITH RADIOACTIVE SEED LOCALIZATION Right 01/06/2021    Procedure: RIGHT BREAST LUMPECTOMY WITH RADIOACTIVE SEED LOCALIZATION X 2;  Surgeon: Vanderbilt Ned, MD;  Location: [facility];  Service: General;  Laterality: Right;   ENDOMETRIAL ABLATION     KNEE ARTHROSCOPY Right    TUBAL LIGATION     Family History  Problem Relation Age of Onset   Breast cancer Mother 106   Social History   Socioeconomic History   Marital status: Married    Spouse name: Not on file   Number of children: Not on file   Years of education: Not on file   Highest education level: Bachelor's degree (e.g., BA, AB, BS)  Occupational History   Not on file  Tobacco Use   Smoking status: Never   Smokeless tobacco: Never  Substance and Sexual Activity   Alcohol use: Not Currently   Drug use: Never   Sexual activity: Yes    Comment: ablation  Other Topics Concern   Not on file  Social History Narrative   Not on file   Social Drivers of Health   Financial Resource Strain: Low Risk  (11/02/2023)   Overall Financial Resource Strain (CARDIA)    Difficulty of Paying Living Expenses: Not very hard  Food Insecurity: No Food Insecurity (11/02/2023)   Hunger Vital Sign    Worried About Running Out of Food in the Last Year: Never true    Ran Out of Food in the Last Year: Never true  Transportation Needs: No Transportation Needs (11/02/2023)   PRAPARE - Administrator, Civil Service (Medical): No    Lack of Transportation (Non-Medical): No  Physical Activity: Sufficiently Active (11/02/2023)   Exercise Vital Sign    Days of Exercise per Week: 5 days    Minutes of Exercise per Session: 120 min  Stress: No Stress Concern Present (11/02/2023)   [employer] of Occupational Health - Occupational Stress Questionnaire    Feeling of Stress: Not at all  Social Connections: Moderately Isolated (11/02/2023)   Social Connection and Isolation Panel    Frequency of Communication with Friends and Family: More than three times a week    Frequency of  Social Gatherings with Friends and Family: Once a week    Attends Religious Services: Never    Database administrator or Organizations: No    Attends Engineer, structural: Never    Marital Status: Married    Tobacco Counseling Counseling given: Not Answered    Clinical Intake:  Pre-visit preparation completed: Yes  Pain : No/denies pain     BMI - recorded: 22.6 Nutritional Status: BMI of 19-24  Normal Nutritional Risks: None Diabetes: No  Lab Results  Component Value Date   HGBA1C 6.1 09/13/2023   HGBA1C 6.2 03/18/2023   HGBA1C 6.1 09/15/2022     How often do you need to have someone help you when you read instructions, pamphlets, or other written materials from your doctor or pharmacy?: 1 - Never What is the last grade level you completed in school?: bachelor's degree  Interpreter Needed?: No  Information  entered by :: Jantzen Pilger, [facility])   Activities of Daily Living     10/27/2023    7:55 AM  In your present state of health, do you have any difficulty performing the following activities:  Hearing? 0  Vision? 0  Difficulty concentrating or making decisions? 0  Walking or climbing stairs? 0  Dressing or bathing? 0  Doing errands, shopping? 0  Preparing Food and eating ? N  Using the Toilet? N  In the past six months, have you accidently leaked urine? N  Do you have problems with loss of bowel control? N  Managing your Medications? N  Managing your Finances? N  Housekeeping or managing your Housekeeping? N    Patient Care Team: Frann Mabel Mt, DO as PCP - General (Family Medicine) Katheleen Rush, OD as Physician Assistant (Optometry)  I have updated your Care Teams any recent Medical Services you may have received from other providers in the past year.     Assessment:   This is a routine wellness examination for [place].  Hearing/Vision screen Hearing Screening - Comments:: Denies hearing difficulties.  Vision Screening -  Comments:: Up to date with routine eye exams with Dr Anette    Goals Addressed   None    Depression Screen     09/13/2023    8:27 AM 03/18/2023    7:33 AM 09/15/2022    7:44 AM 03/03/2022    8:59 AM  PHQ 2/9 Scores  PHQ - 2 Score 0 0 0 0  PHQ- 9 Score 0 0 0    Pt reports no change since 09/13/23  Fall Risk     10/27/2023    7:55 AM 09/13/2023    8:26 AM 03/18/2023    7:33 AM 09/15/2022    7:43 AM 03/03/2022    9:18 AM  Fall Risk   Falls in the past year? 0 0 0 0 1  Number falls in past yr: 0 0 0 0 0  Injury with Fall? 0 0 0 0 1  Risk for fall due to : No Fall Risks   No Fall Risks Orthopedic patient  Follow up Education provided Falls evaluation completed Falls evaluation completed Falls evaluation completed     MEDICARE RISK AT HOME:  Medicare Risk at Home Any stairs in or around the home?: (Patient-Rptd) No Home free of loose throw rugs in walkways, pet beds, electrical cords, etc?: (Patient-Rptd) Yes Adequate lighting in your home to reduce risk of falls?: (Patient-Rptd) Yes Life alert?: (Patient-Rptd) No Use of a cane, walker or w/c?: (Patient-Rptd) No Grab bars in the bathroom?: (Patient-Rptd) No Shower chair or bench in shower?: (Patient-Rptd) No Elevated toilet seat or a handicapped toilet?: (Patient-Rptd) No  TIMED UP AND GO:  Was the test performed?  No, audio  Cognitive Function: 6CIT completed        11/02/2023    1:09 PM  6CIT Screen  What Year? 0 points  What month? 0 points  What time? 0 points  Count back from 20 0 points  Months in reverse 0 points  Repeat phrase 0 points  Total Score 0 points    Immunizations Immunization History  Administered Date(s) Administered   Influenza-Unspecified 12/08/2021, 12/08/2022   PFIZER(Purple Top)SARS-COV-2 Vaccination 04/29/2019, 05/20/2019, 01/09/2020   PNEUMOCOCCAL CONJUGATE-20 12/08/2021   Td 12/08/2021   Zoster Recombinant(Shingrix) 12/03/2018, 02/03/2019    Screening Tests Health Maintenance   Topic Date Due   Medicare Annual Wellness (AWV)  Never done   Influenza Vaccine  09/16/2023   COVID-19 Vaccine (4 - 2025-26 season) 11/01/2024 (Originally 10/17/2023)   Mammogram  08/09/2025   Colonoscopy  03/13/2031   DTaP/Tdap/Td (2 - Tdap) 12/09/2031   Pneumococcal Vaccine: 50+ Years  Completed   DEXA SCAN  Completed   Hepatitis C Screening  Completed   Zoster Vaccines- Shingrix  Completed   HPV VACCINES  Aged Out   Meningococcal B Vaccine  Aged Out    Health Maintenance Items Addressed: Will get flu vaccine at pharmacy, will schedule DEXA in December.  Additional Screening:  Vision Screening: Recommended annual ophthalmology exams for early detection of glaucoma and other disorders of the eye. Is the patient up to date with their annual eye exam?  Yes  Who is the provider or what is the name of the office in which the patient attends annual eye exams? [facility]  Dental Screening: Recommended annual dental exams for proper oral hygiene  Community Resource Referral / Chronic Care Management: CRR required this visit?  No   CCM required this visit?  No   Plan:    I have personally reviewed and noted the following in the patient's chart:   Medical and social history Use of alcohol, tobacco or illicit drugs  Current medications and supplements including opioid prescriptions. Patient is not currently taking opioid prescriptions. Functional ability and status Nutritional status Physical activity Advanced directives List of other physicians Hospitalizations, surgeries, and ER visits in previous 12 months Vitals Screenings to include cognitive, depression, and falls Referrals and appointments  In addition, I have reviewed and discussed with patient certain preventive protocols, quality metrics, and best practice recommendations. A written personalized care plan for preventive services as well as general preventive health recommendations were provided to  patient.   Lolita Libra, CMA   11/02/2023   After Visit Summary: (MyChart) Due to this being a telephonic visit, the after visit summary with patients personalized plan was offered to patient via MyChart   Notes: Nothing significant to report at this time.

## 2023-11-02 NOTE — Patient Instructions (Signed)
 Ms. Laura Shields , Thank you for taking time out of your busy schedule to complete your Annual Wellness Visit with me. I enjoyed our conversation and look forward to speaking with you again next year. I, as well as your care team,  appreciate your ongoing commitment to your health goals. Please review the following plan we discussed and let me know if I can assist you in the future. Your Game plan/ To Do List    Referrals: If you haven't heard from the office you've been referred to, please reach out to them at the phone provided.   Bone Density (Please call [facility] if not already scheduled) Due 01/19/24 or after:  [phone]  Follow up Visits: Next Medicare AWV with our clinical staff: 11/07/24 10:20am, telephone.    Next Office Visit with your provider: 03/19/24 8am, Dr Frann.  Clinician Recommendations:  Aim for 30 minutes of exercise or brisk walking, 6-8 glasses of water, and 5 servings of fruits and vegetables each day.   You will need to get the following vaccines at your local pharmacy:  Flu      This is a list of the screening recommended for you and due dates:  Health Maintenance  Topic Date Due   Medicare Annual Wellness Visit  Never done   Flu Shot  09/16/2023   COVID-19 Vaccine (4 - 2025-26 season) 11/01/2024*   Breast Cancer Screening  08/09/2025   Colon Cancer Screening  03/13/2031   DTaP/Tdap/Td vaccine (2 - Tdap) 12/09/2031   Pneumococcal Vaccine for age over 68  Completed   DEXA scan (bone density measurement)  Completed   Hepatitis C Screening  Completed   Zoster (Shingles) Vaccine  Completed   HPV Vaccine  Aged Out   Meningitis B Vaccine  Aged Out  *Topic was postponed. The date shown is not the original due date.    Advanced directives: (Copy Requested) Please bring a copy of your health care power of attorney and living will to the office to be added to your chart at your convenience. You can mail to [facility] [address]  [place], [place] [address] or email to [email or web address] Advance Care Planning is important because it:  [x]  Makes sure you receive the medical care that is consistent with your values, goals, and preferences  [x]  It provides guidance to your family and loved ones and reduces their decisional burden about whether or not they are making the right decisions based on your wishes.  Follow the link provided in your after visit summary or read over the paperwork we have mailed to you to help you started getting your Advance Directives in place. If you need assistance in completing these, please reach out to us  so that we can help you!  See attachments for Preventive Care and Fall Prevention Tips.

## 2023-11-10 DIAGNOSIS — H5213 Myopia, bilateral: Secondary | ICD-10-CM | POA: Diagnosis not present

## 2023-11-14 DIAGNOSIS — I4892 Unspecified atrial flutter: Secondary | ICD-10-CM | POA: Diagnosis not present

## 2023-11-14 DIAGNOSIS — G4733 Obstructive sleep apnea (adult) (pediatric): Secondary | ICD-10-CM | POA: Diagnosis not present

## 2023-11-14 DIAGNOSIS — K2100 Gastro-esophageal reflux disease with esophagitis, without bleeding: Secondary | ICD-10-CM | POA: Diagnosis not present

## 2023-12-07 DIAGNOSIS — Z01419 Encounter for gynecological examination (general) (routine) without abnormal findings: Secondary | ICD-10-CM | POA: Diagnosis not present

## 2023-12-07 DIAGNOSIS — Z1151 Encounter for screening for human papillomavirus (HPV): Secondary | ICD-10-CM | POA: Diagnosis not present

## 2023-12-07 DIAGNOSIS — Z124 Encounter for screening for malignant neoplasm of cervix: Secondary | ICD-10-CM | POA: Diagnosis not present

## 2023-12-15 ENCOUNTER — Encounter: Payer: Self-pay | Admitting: Family Medicine

## 2023-12-16 ENCOUNTER — Ambulatory Visit (INDEPENDENT_AMBULATORY_CARE_PROVIDER_SITE_OTHER): Admitting: Family Medicine

## 2023-12-16 ENCOUNTER — Encounter: Payer: Self-pay | Admitting: Family Medicine

## 2023-12-16 VITALS — BP 128/74 | HR 67 | Temp 98.0°F | Resp 16 | Ht 66.0 in | Wt 140.0 lb

## 2023-12-16 DIAGNOSIS — L659 Nonscarring hair loss, unspecified: Secondary | ICD-10-CM | POA: Diagnosis not present

## 2023-12-16 LAB — CBC
HCT: 44.1 % (ref 36.0–46.0)
Hemoglobin: 14.9 g/dL (ref 12.0–15.0)
MCHC: 33.8 g/dL (ref 30.0–36.0)
MCV: 92.2 fl (ref 78.0–100.0)
Platelets: 221 K/uL (ref 150.0–400.0)
RBC: 4.79 Mil/uL (ref 3.87–5.11)
RDW: 13.1 % (ref 11.5–15.5)
WBC: 5.2 K/uL (ref 4.0–10.5)

## 2023-12-16 LAB — IBC + FERRITIN
Ferritin: 103.6 ng/mL (ref 10.0–291.0)
Iron: 113 ug/dL (ref 42–145)
Saturation Ratios: 30.8 % (ref 20.0–50.0)
TIBC: 366.8 ug/dL (ref 250.0–450.0)
Transferrin: 262 mg/dL (ref 212.0–360.0)

## 2023-12-16 LAB — VITAMIN D 25 HYDROXY (VIT D DEFICIENCY, FRACTURES): VITD: 66.48 ng/mL (ref 30.00–100.00)

## 2023-12-16 LAB — TSH: TSH: 1.78 u[IU]/mL (ref 0.35–5.50)

## 2023-12-16 LAB — T4, FREE: Free T4: 0.89 ng/dL (ref 0.60–1.60)

## 2023-12-16 NOTE — Progress Notes (Signed)
 Chief Complaint  Patient presents with   Hair/Scalp Problem    Hair Problems    Laura Shields is a 68 y.o. female here for a hair thinning.  Duration: 1 month Location: Scalp Pruritic? No Painful? No Drainage? No New shampoos/hair products/topicals/detergents? No Trauma/new hairstyle? No Other associated symptoms: No focal patches Protein intake is sound. Therapies tried thus far: none  Past Medical History:  Diagnosis Date   Arthritis    bil knees   Atrial fibrillation (HCC)    Depression    GERD (gastroesophageal reflux disease)    Sleep apnea    +Bipap nightly    BP 128/74 (BP Location: Left Arm, Patient Position: Sitting)   Pulse 67   Temp 98 F (36.7 C) (Oral)   Resp 16   Ht 5' 6 (1.676 m)   Wt 140 lb (63.5 kg)   SpO2 98%   BMI 22.60 kg/m  Gen: awake, alert, appearing stated age Lungs: No accessory muscle use Skin: no obvious alopecia or scalp irritation.  Psych: Age appropriate judgment and insight  Hair thinning - Plan: VITAMIN D 25 Hydroxy (Vit-D Deficiency, Fractures), IBC + Ferritin, TSH, T4, free, CBC  Orders as above. Keep protein intake up. If labs WNL, will consider low dose minoxidil.  F/u prn. The patient voiced understanding and agreement to the plan.  Laura Mt [place], DO 12/16/23 11:36 AM

## 2023-12-16 NOTE — Patient Instructions (Signed)
 Give [place] 2-3 business days to get the results of your labs back.  Let us know if you need anything.

## 2023-12-17 ENCOUNTER — Ambulatory Visit: Payer: Self-pay | Admitting: Family Medicine

## 2023-12-17 MED ORDER — MINOXIDIL 2.5 MG PO TABS: 2.5000 mg | ORAL_TABLET | Freq: Every day | ORAL | 0 refills | Status: DC

## 2024-01-04 ENCOUNTER — Other Ambulatory Visit: Payer: Self-pay | Admitting: Family Medicine

## 2024-01-04 ENCOUNTER — Encounter: Payer: Self-pay | Admitting: Family Medicine

## 2024-01-04 MED ORDER — SCOPOLAMINE 1 MG/3DAYS TD PT72: 1.0000 | MEDICATED_PATCH | TRANSDERMAL | 0 refills | Status: AC

## 2024-01-10 ENCOUNTER — Other Ambulatory Visit: Payer: Self-pay | Admitting: Family Medicine

## 2024-02-01 ENCOUNTER — Other Ambulatory Visit: Payer: Self-pay

## 2024-02-01 MED ORDER — BUPROPION HCL ER (XL) 150 MG PO TB24: 150.0000 mg | ORAL_TABLET | Freq: Every day | ORAL | 1 refills | Status: DC

## 2024-02-02 DIAGNOSIS — K2100 Gastro-esophageal reflux disease with esophagitis, without bleeding: Secondary | ICD-10-CM | POA: Diagnosis not present

## 2024-02-02 DIAGNOSIS — I4892 Unspecified atrial flutter: Secondary | ICD-10-CM | POA: Diagnosis not present

## 2024-02-02 DIAGNOSIS — K219 Gastro-esophageal reflux disease without esophagitis: Secondary | ICD-10-CM | POA: Diagnosis not present

## 2024-02-02 DIAGNOSIS — G4733 Obstructive sleep apnea (adult) (pediatric): Secondary | ICD-10-CM | POA: Diagnosis not present

## 2024-02-07 ENCOUNTER — Encounter: Payer: Self-pay | Admitting: Family Medicine

## 2024-02-07 DIAGNOSIS — G4700 Insomnia, unspecified: Secondary | ICD-10-CM

## 2024-02-07 MED ORDER — MINOXIDIL 2.5 MG PO TABS: 2.5000 mg | ORAL_TABLET | Freq: Every day | ORAL | 1 refills | Status: AC

## 2024-02-07 MED ORDER — ROSUVASTATIN CALCIUM 40 MG PO TABS: 40.0000 mg | ORAL_TABLET | Freq: Every day | ORAL | 1 refills | Status: AC

## 2024-02-07 MED ORDER — BUPROPION HCL ER (XL) 150 MG PO TB24: 150.0000 mg | ORAL_TABLET | Freq: Every day | ORAL | 1 refills | Status: AC

## 2024-02-07 MED ORDER — TRAZODONE HCL 50 MG PO TABS: 25.0000 mg | ORAL_TABLET | Freq: Every evening | ORAL | 1 refills | Status: AC | PRN

## 2024-03-06 ENCOUNTER — Other Ambulatory Visit: Payer: Self-pay | Admitting: Obstetrics and Gynecology

## 2024-03-06 DIAGNOSIS — Z1231 Encounter for screening mammogram for malignant neoplasm of breast: Secondary | ICD-10-CM

## 2024-03-19 ENCOUNTER — Encounter: Admitting: Family Medicine

## 2024-03-27 ENCOUNTER — Encounter: Admitting: Family Medicine

## 2024-04-23 ENCOUNTER — Ambulatory Visit

## 2024-11-07 ENCOUNTER — Ambulatory Visit
